# Patient Record
Sex: Male | Born: 1935
Health system: Midwestern US, Community
[De-identification: ages and names within clinical notes are randomized; demographics above are authoritative.]

## PROBLEM LIST (undated history)

## (undated) DIAGNOSIS — I4891 Unspecified atrial fibrillation: Secondary | ICD-10-CM

## (undated) DIAGNOSIS — B019 Varicella without complication: Secondary | ICD-10-CM

## (undated) DIAGNOSIS — C61 Malignant neoplasm of prostate: Secondary | ICD-10-CM

## (undated) DIAGNOSIS — I10 Essential (primary) hypertension: Secondary | ICD-10-CM

## (undated) DIAGNOSIS — S129XXA Fracture of neck, unspecified, initial encounter: Secondary | ICD-10-CM

## (undated) DIAGNOSIS — K5790 Diverticulosis of intestine, part unspecified, without perforation or abscess without bleeding: Secondary | ICD-10-CM

## (undated) DIAGNOSIS — G459 Transient cerebral ischemic attack, unspecified: Secondary | ICD-10-CM

## (undated) DIAGNOSIS — R011 Cardiac murmur, unspecified: Secondary | ICD-10-CM

## (undated) DIAGNOSIS — E119 Type 2 diabetes mellitus without complications: Secondary | ICD-10-CM

## (undated) DIAGNOSIS — C4490 Unspecified malignant neoplasm of skin, unspecified: Secondary | ICD-10-CM

## (undated) DIAGNOSIS — G4730 Sleep apnea, unspecified: Secondary | ICD-10-CM

## (undated) DIAGNOSIS — E785 Hyperlipidemia, unspecified: Secondary | ICD-10-CM

## (undated) DIAGNOSIS — H60311 Diffuse otitis externa, right ear: Principal | ICD-10-CM

## (undated) DIAGNOSIS — Z96611 Presence of right artificial shoulder joint: Secondary | ICD-10-CM

## (undated) DIAGNOSIS — I4821 Permanent atrial fibrillation: Principal | ICD-10-CM

## (undated) DIAGNOSIS — M25511 Pain in right shoulder: Secondary | ICD-10-CM

## (undated) DIAGNOSIS — D472 Monoclonal gammopathy: Secondary | ICD-10-CM

## (undated) DIAGNOSIS — R0602 Shortness of breath: Secondary | ICD-10-CM

## (undated) DIAGNOSIS — I480 Paroxysmal atrial fibrillation: Principal | ICD-10-CM

## (undated) DIAGNOSIS — I4819 Other persistent atrial fibrillation: Principal | ICD-10-CM

## (undated) HISTORY — PX: SHOULDER ARTHROSCOPY W/ ROTATOR CUFF REPAIR: SHX2400

## (undated) HISTORY — PX: HERNIA REPAIR: SHX51

## (undated) HISTORY — DX: Cardiac murmur, unspecified: R01.1

## (undated) HISTORY — PX: COLON SURGERY: SHX602

## (undated) HISTORY — PX: MOHS SURGERY: SUR867

## (undated) HISTORY — DX: Malignant neoplasm of prostate: C61

## (undated) HISTORY — DX: Transient cerebral ischemic attack, unspecified: G45.9

## (undated) HISTORY — DX: Type 2 diabetes mellitus without complications: E11.9

## (undated) HISTORY — DX: Unspecified malignant neoplasm of skin, unspecified: C44.90

## (undated) HISTORY — DX: Fracture of neck, unspecified, initial encounter: S12.9XXA

## (undated) HISTORY — PX: BASAL CELL CARCINOMA EXCISION: SHX1214

---

## 2006-05-10 HISTORY — PX: INSERT / REPLACE / REMOVE PACEMAKER: SUR710

## 2007-02-09 DIAGNOSIS — G459 Transient cerebral ischemic attack, unspecified: Secondary | ICD-10-CM

## 2007-02-09 HISTORY — DX: Transient cerebral ischemic attack, unspecified: G45.9

## 2014-08-30 DIAGNOSIS — I482 Chronic atrial fibrillation, unspecified: Secondary | ICD-10-CM | POA: Insufficient documentation

## 2015-05-21 DIAGNOSIS — R7303 Prediabetes: Secondary | ICD-10-CM | POA: Insufficient documentation

## 2015-05-21 DIAGNOSIS — I480 Paroxysmal atrial fibrillation: Secondary | ICD-10-CM | POA: Insufficient documentation

## 2015-05-21 DIAGNOSIS — E785 Hyperlipidemia, unspecified: Secondary | ICD-10-CM | POA: Insufficient documentation

## 2015-08-27 DIAGNOSIS — C61 Malignant neoplasm of prostate: Secondary | ICD-10-CM | POA: Insufficient documentation

## 2015-09-16 ENCOUNTER — Emergency Department
Admission: EM | Admit: 2015-09-16 | Discharge: 2015-09-16 | Disposition: A | Discharge status: Home / Self Care | Payer: Medicare Other | Attending: Emergency Medicine | Admitting: Emergency Medicine

## 2015-09-16 DIAGNOSIS — S91209A Unspecified open wound of unspecified toe(s) with damage to nail, initial encounter: Secondary | ICD-10-CM

## 2015-09-16 DIAGNOSIS — X58XXXA Exposure to other specified factors, initial encounter: Secondary | ICD-10-CM | POA: Diagnosis not present

## 2015-09-16 DIAGNOSIS — Y999 Unspecified external cause status: Secondary | ICD-10-CM | POA: Insufficient documentation

## 2015-09-16 DIAGNOSIS — Y929 Unspecified place or not applicable: Secondary | ICD-10-CM | POA: Insufficient documentation

## 2015-09-16 DIAGNOSIS — Z7982 Long term (current) use of aspirin: Secondary | ICD-10-CM | POA: Insufficient documentation

## 2015-09-16 DIAGNOSIS — Z7984 Long term (current) use of oral hypoglycemic drugs: Secondary | ICD-10-CM | POA: Diagnosis not present

## 2015-09-16 DIAGNOSIS — Y939 Activity, unspecified: Secondary | ICD-10-CM | POA: Insufficient documentation

## 2015-09-16 DIAGNOSIS — S91204A Unspecified open wound of right lesser toe(s) with damage to nail, initial encounter: Secondary | ICD-10-CM | POA: Insufficient documentation

## 2015-09-16 DIAGNOSIS — Z79899 Other long term (current) drug therapy: Secondary | ICD-10-CM | POA: Diagnosis not present

## 2015-09-16 MED ORDER — LIDOCAINE HCL (PF) 1 % IJ SOLN
INTRAMUSCULAR | Status: AC
  Administered 2015-09-16: 10:00:00
  Filled 2015-09-16: qty 5

## 2015-09-16 NOTE — ED Provider Notes (Signed)
[facility] Emergency Department Provider Note   ____________________________________________   First MD Initiated Contact with Patient 09/16/15 [phone]     (approximate)  I have reviewed the triage vital signs and the nursing notes.   HISTORY  Chief Complaint Toe Pain    HPI Nicholas Patel is a 82 y.o. male patient complain a splint nail on the fifth digit right foot.Patient states request and the nail removed but cautioned that he is on blood thinner and bleeds profusely. Patient denies any pain with this complaint.   No past medical history on file.  There are no active problems to display for this patient.   No past surgical history on file.  Prior to Admission medications   Medication Sig Start Date End Date Taking? Authorizing Provider  alfuzosin (UROXATRAL) 10 MG 24 hr tablet Take 10 mg by mouth daily with breakfast.   Yes Historical Provider, MD  aspirin 81 MG tablet Take 81 mg by mouth daily.   Yes Historical Provider, MD  atorvastatin (LIPITOR) 20 MG tablet Take 20 mg by mouth daily.   Yes Historical Provider, MD  metFORMIN (GLUCOPHAGE) 500 MG tablet Take 500 mg by mouth 2 (two) times daily with a meal.   Yes Historical Provider, MD  metoprolol tartrate (LOPRESSOR) 25 MG tablet Take 25 mg by mouth 2 (two) times daily.   Yes Historical Provider, MD  montelukast (SINGULAIR) 10 MG tablet Take 10 mg by mouth at bedtime.   Yes Historical Provider, MD  Rivaroxaban (XARELTO) 15 MG TABS tablet Take 15 mg by mouth 2 (two) times daily with a meal.   Yes Historical Provider, MD    Allergies Hyman Hopes allergy]  No family history on file.  Social History Social History  Substance Use Topics  . Smoking status: Not on file  . Smokeless tobacco: Not on file  . Alcohol use Not on file    Review of Systems Constitutional: No fever/chills Eyes: No visual changes. ENT: No sore throat. Cardiovascular: Denies chest pain. Respiratory: Denies  shortness of breath. Gastrointestinal: No abdominal pain.  No nausea, no vomiting.  No diarrhea.  No constipation. Genitourinary: Negative for dysuria. Musculoskeletal: Negative for back pain. Skin: Negative for rash.Split toenail Neurological: Negative for headaches, focal weakness or numbness.    ____________________________________________   PHYSICAL EXAM:  VITAL SIGNS: ED Triage Vitals  Enc Vitals Group     BP 09/16/15 0830 139/87     Pulse Rate 09/16/15 0830 62     Resp 09/16/15 0830 18     Temp 09/16/15 0830 98.2 F (36.8 C)     Temp Source 09/16/15 0830 Oral     SpO2 09/16/15 0830 99 %     Weight 09/16/15 0830 160 lb (72.6 kg)     Height 09/16/15 0830 5\' 7"  (1.702 m)     Head Circumference --      Peak Flow --      Pain Score 09/16/15 0831 0     Pain Loc --      Pain Edu? --      Excl. in [place]? --     Constitutional: Alert and oriented. Well appearing and in no acute distress. Eyes: Conjunctivae are normal. PERRL. EOMI. Head: Atraumatic. Nose: No congestion/rhinnorhea. Mouth/Throat: Mucous membranes are moist.  Oropharynx non-erythematous. Neck: No stridor. No cervical spine tenderness to palpation. Hematological/Lymphatic/Immunilogical: No cervical lymphadenopathy. Cardiovascular: Normal rate, regular rhythm. Grossly normal heart sounds.  Good peripheral circulation. Respiratory: Normal respiratory effort.  No  retractions. Lungs CTAB. Gastrointestinal: Soft and nontender. No distention. No abdominal bruits. No CVA tenderness. Musculoskeletal: No lower extremity tenderness nor edema.  No joint effusions. Neurologic:  Normal speech and language. No gross focal neurologic deficits are appreciated. No gait instability. Skin:  Skin is warm, dry and intact. No rash noted. Split toenail with partial avulsion to the medial nail. Psychiatric: Mood and affect are normal. Speech and behavior are normal.  ____________________________________________   LABS (all labs  ordered are listed, but only abnormal results are displayed)  Labs Reviewed - No data to display ____________________________________________  EKG   ____________________________________________  RADIOLOGY   ____________________________________________   PROCEDURES  Procedure(s) performed: None  Procedures  Critical Care performed: No  ____________________________________________   INITIAL IMPRESSION / ASSESSMENT AND PLAN / ED COURSE  Pertinent labs & imaging results that were available during my care of the patient were reviewed by me and considered in my medical decision making (see chart for details).  Partial avulsion of the fifth right toe nail. Status post digital block nail was completely removed. Patient given discharge care instructions. Patient advised to follow-up with  clinical continued care.  Clinical Course     ____________________________________________   FINAL CLINICAL IMPRESSION(S) / ED DIAGNOSES  Final diagnoses:  Toenail avulsion, initial encounter      NEW MEDICATIONS STARTED DURING THIS VISIT:  New Prescriptions   No medications on file     Note:  This document was prepared using Dragon voice recognition software and may include unintentional dictation errors.    Sable Feil, PA-C 09/16/15 [place], MD 09/16/15 [phone]

## 2015-09-16 NOTE — ED Notes (Signed)
See triage note  States his toenail split last pm  Concerned about bleeding and infection  Bleeding controlled at present

## 2015-09-16 NOTE — ED Triage Notes (Signed)
Pt reports that he pulled his rt pinky toenail last night with the covers and it split. Pt is on blood thinners.

## 2015-10-14 ENCOUNTER — Encounter: Payer: Medicare Other | Admitting: Internal Medicine

## 2015-11-04 ENCOUNTER — Encounter: Payer: Self-pay | Admitting: Internal Medicine

## 2015-11-04 ENCOUNTER — Ambulatory Visit: Payer: Medicare Other | Admitting: Internal Medicine

## 2015-11-04 ENCOUNTER — Ambulatory Visit (INDEPENDENT_AMBULATORY_CARE_PROVIDER_SITE_OTHER): Payer: Medicare Other | Admitting: Internal Medicine

## 2015-11-04 VITALS — BP 102/62 | HR 74 | Ht 67.0 in | Wt 169.8 lb

## 2015-11-04 DIAGNOSIS — I951 Orthostatic hypotension: Secondary | ICD-10-CM | POA: Diagnosis not present

## 2015-11-04 DIAGNOSIS — Z0000 Encounter for general adult medical examination without abnormal findings: Secondary | ICD-10-CM | POA: Diagnosis not present

## 2015-11-04 DIAGNOSIS — Z950 Presence of cardiac pacemaker: Secondary | ICD-10-CM

## 2015-11-04 DIAGNOSIS — I4891 Unspecified atrial fibrillation: Secondary | ICD-10-CM

## 2015-11-04 MED ORDER — RIVAROXABAN 20 MG PO TABS: ORAL_TABLET | ORAL | 3 refills | Status: AC

## 2015-11-04 MED ORDER — RIVAROXABAN 20 MG PO TABS: ORAL_TABLET | ORAL | Status: DC

## 2015-11-04 NOTE — Addendum Note (Signed)
Addended by: Alvis Lemmings C on: 11/04/2015 01:58 PM   Modules accepted: Orders

## 2015-11-04 NOTE — Progress Notes (Signed)
ELECTROPHYSIOLOGY CONSULT NOTE  Patient ID: Nicholas Patel, MRN: [id number], DOB/AGE: 1930/08/09 82 y.o. Admit date: (Not on file) Date of Consult: 11/04/2015  Primary Physician: Glendon Axe, MD Primary Cardiologist: [place] Physician none  Chief Complaint: establish   HPI Nicholas Patel is a 82 y.o. male  Seen to establish pacemaker follow-up. He has a [employer] device implanted  2008  He has a history of atrial fibrillation for which he has been treated with propafenone as well as anticoagulated with Xarelto and aspirin.  More remotely he had been on warfarin. Referral to dose had been decreased from 20--15 because of bruising; aspirin was maintained. His outside medical record refers to a TIA.   He has no known coronary disease. He does not recall prior catheterization or stress testing.    he has had no problems with chest pain or shortness of breath. His activity however is markedly limited by "his stability". He has a more remote history of significant orthostatic intolerance. He has had a series of falls with significant injury.   He also has sleep apnea  He also has an episode where "his heart stopped" about 4 years ago. This occurred in the context of his pacemaker and was his understanding of what he was told by his cardiologist in [place]. This was associated with severe dyspnea  Review his old medical records in care everywhere from [place] did not include an ultrasound or stress testing   GFR 7/17 was 65   Cr 0.97  Past Medical History:  Diagnosis Date  . Broken neck ([place])   . Diabetes mellitus without complication ([place])   . Heart murmur    as child  . Prostate cancer ([place])   . Skin cancer   . TIA (transient ischemic attack) 2009      Surgical History:  Past Surgical History:  Procedure Laterality Date  . COLON SURGERY    . HERNIA REPAIR Left   . INSERT / REPLACE / REMOVE PACEMAKER  05/2006  . MOHS SURGERY    . SHOULDER ARTHROSCOPY W/ ROTATOR  CUFF REPAIR Left      Home Meds: Prior to Admission medications   Medication Sig Start Date End Date Taking? Authorizing Provider  alfuzosin (UROXATRAL) 10 MG 24 hr tablet Take 10 mg by mouth daily with breakfast.   Yes Historical Provider, MD  aspirin 81 MG tablet Take 81 mg by mouth daily.   Yes Historical Provider, MD  atorvastatin (LIPITOR) 40 MG tablet Take 40 mg by mouth daily.   Yes Historical Provider, MD  metFORMIN (GLUCOPHAGE) 500 MG tablet Take 500 mg by mouth 2 (two) times daily with a meal.   Yes Historical Provider, MD  metoprolol tartrate (LOPRESSOR) 25 MG tablet Take 25 mg by mouth 2 (two) times daily.   Yes Historical Provider, MD  montelukast (SINGULAIR) 10 MG tablet Take 10 mg by mouth at bedtime.   Yes Historical Provider, MD  Rivaroxaban (XARELTO) 15 MG TABS tablet Take 15 mg by mouth daily with supper.    Yes Historical Provider, MD    Allergies:  Allergies  Allergen Reactions  . Other     Other reaction(s): Other (See Comments) Dust/cats - sneezing  . Hyman Hopes Allergy]     Social History   Social History  . Marital status: Single    Spouse name: N/A  . Number of children: N/A  . Years of education: N/A   Occupational History  . Not on file.  Social History Main Topics  . Smoking status: Never Smoker  . Smokeless tobacco: Never Used  . Alcohol use 0.6 oz/week    1 Glasses of wine per week     Comment: qd  . Drug use: No  . Sexual activity: Not on file   Other Topics Concern  . Not on file   Social History Narrative  . No narrative on file     Family History  Problem Relation Age of Onset  . Lung cancer Mother      ROS:  Please see the history of present illness.     All other systems reviewed and negative.    Physical Exam:  Blood pressure 102/62, pulse 74, height 5\' 7"  (1.702 m), weight 169 lb 12 oz (77 kg). General: Well developed, well nourished male in no acute distress. Head: Normocephalic, atraumatic, sclera non-icteric,  no xanthomas, nares are without discharge. EENT: normal  Lymph Nodes:  none Neck: Negative for carotid bruits. JVD not elevated. Back:without scoliosis kyphosis  Device pocket well healed; without hematoma or erythema.  There is no tethering  Lungs: Clear bilaterally to auscultation without wheezes, rales, or rhonchi. Breathing is unlabored. Device pocket well healed; without hematoma or erythema.  There is no tethering Heart: RRR with S1 S2. 2/6 systooc murmur . No rubs, or gallops appreciated. Abdomen: Soft, non-tender, non-distended with normoactive bowel sounds. No hepatomegaly. No rebound/guarding. No obvious abdominal masses. Msk:  Strength and tone appear normal for age. Extremities: No clubbing or cyanosis. No  + edema.  Distal pedal pulses are 2+ and equal bilaterally. Skin: Warm and Dry Neuro: Alert and oriented X 3. CN III-XII intact Grossly normal sensory and motor function . Psych:  Responds to questions appropriately with a normal affect.      Labs: Cardiac Enzymes No results for input(s): CKTOTAL, CKMB, TROPONINI in the last 72 hours. CBC No results found for: WBC, HGB, HCT, MCV, PLT PROTIME: No results for input(s): LABPROT, INR in the last 72 hours. Chemistry No results for input(s): NA, K, CL, CO2, BUN, CREATININE, CALCIUM, PROT, BILITOT, ALKPHOS, ALT, AST, GLUCOSE in the last 168 hours.  Invalid input(s): LABALBU Lipids No results found for: CHOL, HDL, LDLCALC, TRIG BNP No results found for: PROBNP Thyroid Function Tests: No results for input(s): TSH, T4TOTAL, T3FREE, THYROIDAB in the last 72 hours.  Invalid input(s): FREET3 Miscellaneous No results found for: DDIMER  Radiology/Studies:  No results found.  EKG: `atrial fibrillation with underlying ventricular pacing and intermittent conduction   Assessment and Plan: Atrial fibrillation-persistent  Hypertension  Recurrent falls  Orthostatic intolerance  Falls  Pacemaker-St. Jude  The patient's  device was interrogated.  The information was reviewed. No changes were made in the programming.     The patient has orthostatic intolerance with a change in heart rate from 70-110  I suspect this may be contributing to his falls  nonpharmacological maneuvers including by compression and abdominal compression. Will not make any adjustments on his blood pressure medicines  His anticoagulant regime is curious; we will discontinue his aspirin increases Xarelto back to 20 mg. His GFR a few months ago is greater than 60  We will obtain an echocardiogram to look at left ventricular function  He has ischemic heart disease.  Virl Axe

## 2015-11-04 NOTE — Patient Instructions (Addendum)
Medication Instructions: - Your physician has recommended you make the following change in your medication:  1) Stop aspirin 2) Increase Xarelto to 20 mg one tablet by mouth once daily  Labwork: - none ordered  Procedures/Testing: - Your physician has requested that you have an echocardiogram. Echocardiography is a painless test that uses sound waves to create images of your heart. It provides your doctor with information about the size and shape of your heart and how well your heart's chambers and valves are working. This procedure takes approximately one hour. There are no restrictions for this procedure.  Follow-Up: - Your physician recommends that you schedule a follow-up appointment in: 10-12 weeks with Dr. Caryl Comes  Any Additional Special Instructions Will Be Listed Below (If Applicable). - please obtain/ wear an abdominal binder & spandex shorts    If you need a refill on your cardiac medications before your next appointment, please call your pharmacy.

## 2015-11-11 ENCOUNTER — Encounter: Payer: Medicare Other | Admitting: Internal Medicine

## 2015-11-20 ENCOUNTER — Other Ambulatory Visit: Payer: Self-pay

## 2015-11-20 ENCOUNTER — Ambulatory Visit (INDEPENDENT_AMBULATORY_CARE_PROVIDER_SITE_OTHER): Payer: Medicare Other

## 2015-11-20 DIAGNOSIS — I4891 Unspecified atrial fibrillation: Secondary | ICD-10-CM

## 2015-11-21 ENCOUNTER — Encounter: Payer: Self-pay | Admitting: Internal Medicine

## 2015-11-27 ENCOUNTER — Emergency Department: Payer: Medicare Other

## 2015-11-27 ENCOUNTER — Emergency Department
Admission: EM | Admit: 2015-11-27 | Discharge: 2015-11-27 | Disposition: A | Discharge status: Home / Self Care | Payer: Medicare Other | Attending: Emergency Medicine | Admitting: Emergency Medicine

## 2015-11-27 ENCOUNTER — Encounter: Payer: Self-pay | Admitting: Emergency Medicine

## 2015-11-27 DIAGNOSIS — Z85828 Personal history of other malignant neoplasm of skin: Secondary | ICD-10-CM | POA: Insufficient documentation

## 2015-11-27 DIAGNOSIS — E119 Type 2 diabetes mellitus without complications: Secondary | ICD-10-CM | POA: Diagnosis not present

## 2015-11-27 DIAGNOSIS — Z79899 Other long term (current) drug therapy: Secondary | ICD-10-CM | POA: Insufficient documentation

## 2015-11-27 DIAGNOSIS — K5900 Constipation, unspecified: Secondary | ICD-10-CM | POA: Diagnosis not present

## 2015-11-27 DIAGNOSIS — Z7984 Long term (current) use of oral hypoglycemic drugs: Secondary | ICD-10-CM | POA: Diagnosis not present

## 2015-11-27 DIAGNOSIS — Z8546 Personal history of malignant neoplasm of prostate: Secondary | ICD-10-CM | POA: Insufficient documentation

## 2015-11-27 LAB — GLUCOSE, CAPILLARY: GLUCOSE-CAPILLARY: 109 mg/dL — AB (ref 65–99)

## 2015-11-27 MED ORDER — FLEET PEDIATRIC 3.5-9.5 GM/59ML RE ENEM
1.0000 | ENEMA | Freq: Once | RECTAL | Status: DC
  Filled 2015-11-27: qty 1

## 2015-11-27 MED ORDER — FLEET ENEMA 7-19 GM/118ML RE ENEM
1.0000 | ENEMA | Freq: Once | RECTAL | Status: AC
  Administered 2015-11-27: 1 via RECTAL
  Filled 2015-11-27: qty 1

## 2015-11-27 MED ORDER — POLYETHYLENE GLYCOL 3350 17 G PO PACK: 17.0000 g | PACK | Freq: Every day | ORAL | 0 refills | Status: DC

## 2015-11-27 NOTE — ED Provider Notes (Signed)
[facility] [place] Provider Note   CSN: [id number] Arrival date & time: 11/27/15  1007     History   Chief Complaint Chief Complaint  Patient presents with  . Constipation    HPI ESTHER SELLARS is a 82 y.o. male hx of DM, prostate cancer, colon cancer s/p partial colectomy here with constipation, lower abdominal pain. Patient states that he has been constipated for the last week or so. Last normal bowel movement was about a week ago. Has been trying some prune juice and over-the-counter laxatives with minimal relief. Denies any vomiting or fevers or urinary symptoms. Patient states that he had previous episodes like this before and required an enema. No history of bowel obstruction.    The history is provided by the patient.    Past Medical History:  Diagnosis Date  . Broken neck ([place])   . Diabetes mellitus without complication ([place])   . Heart murmur    as child  . Prostate cancer ([place])   . Skin cancer   . TIA (transient ischemic attack) 2009    There are no active problems to display for this patient.   Past Surgical History:  Procedure Laterality Date  . COLON SURGERY    . HERNIA REPAIR Left   . INSERT / REPLACE / REMOVE PACEMAKER  05/2006  . MOHS SURGERY    . SHOULDER ARTHROSCOPY W/ ROTATOR CUFF REPAIR Left        Home Medications    Prior to Admission medications   Medication Sig Start Date End Date Taking? Authorizing Provider  alfuzosin (UROXATRAL) 10 MG 24 hr tablet Take 10 mg by mouth daily with breakfast.   Yes Historical Provider, MD  atorvastatin (LIPITOR) 40 MG tablet Take 40 mg by mouth daily.   Yes Historical Provider, MD  metFORMIN (GLUCOPHAGE) 500 MG tablet Take 500 mg by mouth daily.    Yes Historical Provider, MD  metoprolol tartrate (LOPRESSOR) 25 MG tablet Take 25 mg by mouth 2 (two) times daily.   Yes Historical Provider, MD  montelukast (SINGULAIR) 10 MG tablet Take 10 mg by mouth at bedtime.   Yes Historical Provider, MD    propafenone (RYTHMOL) 300 MG tablet Take 1 tablet by mouth 2 (two) times daily. 10/15/15  Yes Historical Provider, MD  rivaroxaban (XARELTO) 20 MG TABS tablet Take one tablet (20 mg) by mouth once daily 11/04/15  Yes Deboraha Sprang, MD    Family History Family History  Problem Relation Age of Onset  . Lung cancer Mother     Social History Social History  Substance Use Topics  . Smoking status: Never Smoker  . Smokeless tobacco: Never Used  . Alcohol use 0.6 oz/week    1 Glasses of wine per week     Comment: qd     Allergies   Other and Salmon [fish allergy]   Review of Systems Review of Systems  Gastrointestinal: Positive for constipation.  All other systems reviewed and are negative.    Physical Exam Updated Vital Signs BP 108/64 (BP Location: Right Arm)   Pulse 73   Temp 97.7 F (36.5 C) (Oral)   Resp 15   Ht 5\' 2"  (1.575 m)   Wt 158 lb (71.7 kg)   SpO2 97%   BMI 28.90 kg/m   Physical Exam  Constitutional: He is oriented to person, place, and time. He appears well-developed.  HENT:  Head: Normocephalic.  Eyes: EOM are normal. Pupils are equal, round, and reactive to light.  Neck:  Normal range of motion. Neck supple.  Cardiovascular: Normal rate, regular rhythm and normal heart sounds.   Pulmonary/Chest: Effort normal and breath sounds normal. No respiratory distress. He has no wheezes.  Abdominal: Soft.  Mild LLQ tenderness, no rebound   Genitourinary:  Genitourinary Comments: Rectal- small stool at vault, no obvious stool impaction   Musculoskeletal: Normal range of motion.  Neurological: He is alert and oriented to person, place, and time.  Skin: Skin is warm.  Psychiatric: He has a normal mood and affect.  Nursing note and vitals reviewed.    ED Treatments / Results  Labs (all labs ordered are listed, but only abnormal results are displayed) Labs Reviewed  GLUCOSE, CAPILLARY - Abnormal; Notable for the following:       Result Value    Glucose-Capillary 109 (*)    All other components within normal limits  CBG MONITORING, ED    EKG  EKG Interpretation None       Radiology Dg Abd Acute W/chest  Result Date: 11/27/2015 CLINICAL DATA:  Patient with history of constipation. EXAM: DG ABDOMEN ACUTE W/ 1V CHEST COMPARISON:  None. FINDINGS: Dual lead pacer apparatus overlies the left hemi thorax. Tortuosity of the thoracic aorta. Normal cardiac contours. Small calcified granuloma within the right lower lobe. No pleural effusion or pneumothorax. Stool throughout the colon as can be seen with constipation. No free intraperitoneal air. No evidence for bowel obstruction. Lower thoracic and lumbar spine degenerative changes. IMPRESSION: No acute cardiopulmonary process. Stool throughout the colon as can be seen with constipation. Electronically Signed   By: Lovey Newcomer M.D.   On: 11/27/2015 11:50    Procedures Procedures (including critical care time)  Medications Ordered in ED Medications  sodium phosphate (FLEET) 7-19 GM/118ML enema 1 enema (1 enema Rectal Given 11/27/15 1347)     Initial Impression / Assessment and Plan / ED Course  I have reviewed the triage vital signs and the nursing notes.  Pertinent labs & imaging results that were available during my care of the patient were reviewed by me and considered in my medical decision making (see chart for details).  Clinical Course    MAT SUFFRIDGE is a 82 y.o. male here with LLQ pain, constipation. Symptoms likely from constipation. No vomiting to suggest SBO. Will get xrays, give enema and reassess.   2:27 PM Xray showed constipation and no SBO. Given enema and had a bowel movement. Felt better. Recommend miralax daily until stools are soft.   Final Clinical Impressions(s) / ED Diagnoses   Final diagnoses:  None    New Prescriptions New Prescriptions   No medications on file     Drenda Freeze, MD 11/27/15 1428

## 2015-11-27 NOTE — ED Triage Notes (Signed)
Says constipation.  Says has had before and can usually give self an enema and get better.  Says hx of colon surgery 17 yrs ago.

## 2015-11-27 NOTE — ED Notes (Signed)
Fleets ordered from pharmacy

## 2015-11-27 NOTE — Discharge Instructions (Signed)
Take miralax daily until your stools are soft for a week.   See your doctor.  Stay hydrated   Return to ER if you have worse abdominal pain, constipation, vomiting, fever.

## 2015-11-27 NOTE — ED Notes (Signed)
Report received - pt here for constipation with xr ordered and cbg.

## 2015-12-01 DIAGNOSIS — I480 Paroxysmal atrial fibrillation: Secondary | ICD-10-CM | POA: Insufficient documentation

## 2015-12-01 DIAGNOSIS — E7800 Pure hypercholesterolemia, unspecified: Secondary | ICD-10-CM | POA: Insufficient documentation

## 2015-12-01 DIAGNOSIS — E119 Type 2 diabetes mellitus without complications: Secondary | ICD-10-CM | POA: Insufficient documentation

## 2015-12-09 ENCOUNTER — Encounter: Payer: Self-pay | Admitting: Urology

## 2015-12-09 ENCOUNTER — Ambulatory Visit (INDEPENDENT_AMBULATORY_CARE_PROVIDER_SITE_OTHER): Payer: Medicare Other | Admitting: Urology

## 2015-12-09 VITALS — BP 103/72 | HR 71 | Ht 67.0 in | Wt 168.0 lb

## 2015-12-09 DIAGNOSIS — Z8546 Personal history of malignant neoplasm of prostate: Secondary | ICD-10-CM | POA: Diagnosis not present

## 2015-12-09 DIAGNOSIS — N401 Enlarged prostate with lower urinary tract symptoms: Secondary | ICD-10-CM | POA: Diagnosis not present

## 2015-12-09 DIAGNOSIS — N138 Other obstructive and reflux uropathy: Secondary | ICD-10-CM

## 2015-12-09 DIAGNOSIS — R3129 Other microscopic hematuria: Secondary | ICD-10-CM

## 2015-12-09 LAB — URINALYSIS, COMPLETE
Bilirubin, UA: NEGATIVE
GLUCOSE, UA: NEGATIVE
KETONES UA: NEGATIVE
Nitrite, UA: NEGATIVE
Protein, UA: NEGATIVE
SPEC GRAV UA: 1.02 (ref 1.005–1.030)
Urobilinogen, Ur: 2 mg/dL — ABNORMAL HIGH (ref 0.2–1.0)
pH, UA: 6 (ref 5.0–7.5)

## 2015-12-09 LAB — MICROSCOPIC EXAMINATION: Bacteria, UA: NONE SEEN

## 2015-12-09 NOTE — Progress Notes (Signed)
 12/09/2015 4:38 PM   Nicholas Patel 07/09/1930 [id number]  Referring provider: [name], MD [address] [facility] [place], [place] [address]  Chief Complaint  Patient presents with  . Prostate Cancer    New Patient  . Hematuria    HPI:  82 yo M here to establish care for history of prostate cancer and microscopic hematuria.    He is a personal history of prostate cancer and underwent radiation treatments for this in 2010 in [place], [place] in 2000.  Most recent PSA 0.67 on 12/01/15.    He has has a history of BPH and is on Uroxatral which he has been taking for may years.  His urinary symptoms are stable.  No voiding symptoms today.  When he comes off the medication, he has urinary hesitancy.  Nocturia x1-2.    He was also noted to have microscopic hematuria on UA performed and 08/2015. He had 4-10 red blood cells per high-powered field in the absence of evidence of infection.  He has persistent microscopic hematuria 3-10 RBC/ HPF, otherwise unremarkable.    He is chronically on Xarelto.  He is a nonsmoker.  No history of flank pain or kidney stones.   PMH: Past Medical History:  Diagnosis Date  . Broken neck (HCC)   . Diabetes mellitus without complication (HCC)   . Heart murmur    as child  . Prostate cancer (HCC)   . Skin cancer   . TIA (transient ischemic attack) 2009    Surgical History: Past Surgical History:  Procedure Laterality Date  . COLON SURGERY    . HERNIA REPAIR Left   . INSERT / REPLACE / REMOVE PACEMAKER  05/2006  . MOHS SURGERY    . SHOULDER ARTHROSCOPY W/ ROTATOR CUFF REPAIR Left     Home Medications:    Medication List       Accurate as of 12/09/15  4:38 PM. Always use your most recent med list.          alfuzosin 10 MG 24 hr tablet Commonly known as:  UROXATRAL Take 10 mg by mouth daily with breakfast.   atorvastatin 40 MG tablet Commonly known as:  LIPITOR Take 40 mg by mouth daily.   metFORMIN 500 MG  tablet Commonly known as:  GLUCOPHAGE Take 500 mg by mouth daily.   metoprolol tartrate 25 MG tablet Commonly known as:  LOPRESSOR Take 25 mg by mouth 2 (two) times daily.   montelukast 10 MG tablet Commonly known as:  SINGULAIR Take 10 mg by mouth at bedtime.   polyethylene glycol packet Commonly known as:  MIRALAX / GLYCOLAX Take 17 g by mouth daily.   propafenone 300 MG tablet Commonly known as:  RYTHMOL Take 1 tablet by mouth 2 (two) times daily.   rivaroxaban 20 MG Tabs tablet Commonly known as:  XARELTO Take one tablet (20 mg) by mouth once daily       Allergies:  Allergies  Allergen Reactions  . Other     Other reaction(s): Other (See Comments) Dust/cats - sneezing  . [place]mon [Fish Allergy]     Family History: Family History  Problem Relation Age of Onset  . Lung cancer Mother   . Prostate cancer Neg Hx   . Kidney cancer Neg Hx     Social History:  reports that he has never smoked. He has never used smokeless tobacco. He reports that he drinks about 0.6 oz of alcohol per week . He reports that   he does not use drugs.  ROS: UROLOGY Frequent Urination?: No Hard to postpone urination?: No Burning/pain with urination?: No Get up at night to urinate?: Yes Leakage of urine?: No Urine stream starts and stops?: No Trouble starting stream?: No Do you have to strain to urinate?: No Blood in urine?: No Urinary tract infection?: No Sexually transmitted disease?: No Injury to kidneys or bladder?: No Painful intercourse?: No Weak stream?: No Erection problems?: No Penile pain?: No  Gastrointestinal Nausea?: No Vomiting?: No Indigestion/heartburn?: No Diarrhea?: No Constipation?: Yes  Constitutional Fever: No Night sweats?: No Weight loss?: No Fatigue?: No  Skin Skin rash/lesions?: Yes Itching?: No  Eyes Blurred vision?: No Double vision?: No  Ears/Nose/Throat Sore throat?: No Sinus problems?: No  Hematologic/Lymphatic Swollen glands?:  No Easy bruising?: No  Cardiovascular Leg swelling?: No Chest pain?: No  Respiratory Cough?: No Shortness of breath?: No  Endocrine Excessive thirst?: No  Musculoskeletal Back pain?: Yes Joint pain?: No  Neurological Headaches?: No Dizziness?: No  Psychologic Depression?: No Anxiety?: No  Physical Exam: BP 103/72   Pulse 71   Ht 5' 7" (1.702 m)   Wt 168 lb (76.2 kg)   BMI 26.31 kg/m   Constitutional:  Alert and oriented, No acute distress. HEENT: [place] AT, moist mucus membranes.  Trachea midline, no masses. Cardiovascular: No clubbing, cyanosis, or edema. Respiratory: Normal respiratory effort, no increased work of breathing. GI: Abdomen is soft, nontender, nondistended, no abdominal masses GU: No CVA tenderness.  Skin: No rashes, bruises or suspicious lesions. Neurologic: Grossly intact, no focal deficits, moving all 4 extremities. Psychiatric: Normal mood and affect.  Laboratory Data: Comprehensive Metabolic Panel (CMP) (44/81/8563 9:53 AM) Comprehensive Metabolic Panel (CMP) (14/97/0263 9:53 AM)  Component Value Ref Range  Glucose 112 (H) 70 - 110 mg/dL  Sodium 132 (L) 136 - 145 mmol/L  Potassium 4.7 3.6 - 5.1 mmol/L  Chloride 101 97 - 109 mmol/L  Carbon Dioxide (CO2) 27.0 22.0 - 32.0 mmol/L  Urea Nitrogen (BUN) 17 7 - 25 mg/dL  Creatinine 1.0 0.7 - 1.3 mg/dL  Glomerular Filtration Rate (eGFR), MDRD Estimate 71 >60 mL/min/1.73sq m   PSA, Total (Screen) (12/01/2015 9:53 AM) PSA, Total (Screen) (12/01/2015 9:53 AM)  Component Value Ref Range  PSA (Prostate Specific Antigen), Total 0.67 0.10 - 4.00 ng/mL   CBC w/auto Differential (5 Part) (12/01/2015 9:53 AM) CBC w/auto Differential (5 Part) (12/01/2015 9:53 AM)  Component Value Ref Range  WBC (White Blood Cell Count) 6.0 4.1 - 10.2 10^3/uL  RBC (Red Blood Cell Count) 3.66 (L) 4.69 - 6.13 10^6/uL  Hemoglobin 11.8 (L) 14.1 - 18.1 gm/dL  Hematocrit 34.0 (L) 40.0 - 52.0 %  MCV (Mean Corpuscular Volume)  92.9 80.0 - 100.0 fl  MCH (Mean Corpuscular Hemoglobin) 32.2 (H) 27.0 - 31.2 pg  MCHC (Mean Corpuscular Hemoglobin Concentration) 34.7 32.0 - 36.0 gm/dL  Platelet Count 100 (L) 150 - 450 10^3/uL  RDW-CV (Red Cell Distribution Width) 17.5 (H) 11.6 - 14.8 %   Urinalysis w/Microscopic (12/01/2015 9:53 AM) Urinalysis w/Microscopic (12/01/2015 9:53 AM)  Component Value Ref Range  Color Yellow Yellow, Straw  Clarity Clear Clear  Specific Gravity 1.010 1.000 - 1.030  pH, Urine 7.0 5.0 - 8.0  Protein, Urinalysis Negative Negative, Trace mg/dL  Glucose, Urinalysis Negative Negative mg/dL  Ketones, Urinalysis Negative Negative mg/dL  Blood, Urinalysis Small (A) Negative  Nitrite, Urinalysis Negative Negative  Leukocyte Esterase, Urinalysis Negative Negative  White Blood Cells, Urinalysis None Seen None Seen, 0-3 /hpf  Red Blood  Cells, Urinalysis 4-10 (A) None Seen, 0-3 /hpf  Bacteria, Urinalysis Rare (A) None Seen /hpf  Squamous Epithelial Cells, Urinalysis Rare Rare, Few, None Seen /hpf    Urinalysis UA today with 3-10 RBC/ HFP, see EPIC   Pertinent Imaging: No recent GU imaging  Assessment & Plan:    1. History of prostate cancer S/p XRT 2010 Most recent PSA 0.67, presumably at nadir Recommend annual PSA   2. BPH with obstruction/lower urinary tract symptoms Continue Uroxitrol   3. Microscopic hematuria We discussed the differential diagnosis for microscopic hematuria including nephrolithiasis, renal or upper tract tumors, bladder stones, UTIs, or bladder tumors as well as undetermined etiologies. Per AUA guidelines, I did recommend complete microscopic hematuria evaluation including CTU, possible urine cytology, and office cystoscopy. - Urinalysis, Complete - CT HEMATURIA WORKUP; Future   Return in about 4 weeks (around 01/06/2016) for cystoscopy, f/u CT urogram.  Ashley Brandon, MD  [facility] [address], [address] [place], [place]  [address] [phone]  

## 2015-12-23 ENCOUNTER — Ambulatory Visit
Admission: RE | Admit: 2015-12-23 | Discharge: 2015-12-23 | Disposition: A | Discharge status: Home / Self Care | Payer: Medicare Other | Source: Ambulatory Visit | Attending: Urology | Admitting: Urology

## 2015-12-23 DIAGNOSIS — I2510 Atherosclerotic heart disease of native coronary artery without angina pectoris: Secondary | ICD-10-CM | POA: Insufficient documentation

## 2015-12-23 DIAGNOSIS — D71 Functional disorders of polymorphonuclear neutrophils: Secondary | ICD-10-CM | POA: Insufficient documentation

## 2015-12-23 DIAGNOSIS — K4090 Unilateral inguinal hernia, without obstruction or gangrene, not specified as recurrent: Secondary | ICD-10-CM | POA: Insufficient documentation

## 2015-12-23 DIAGNOSIS — R3129 Other microscopic hematuria: Secondary | ICD-10-CM | POA: Insufficient documentation

## 2015-12-23 DIAGNOSIS — K5730 Diverticulosis of large intestine without perforation or abscess without bleeding: Secondary | ICD-10-CM | POA: Diagnosis not present

## 2015-12-23 DIAGNOSIS — K439 Ventral hernia without obstruction or gangrene: Secondary | ICD-10-CM | POA: Diagnosis not present

## 2015-12-23 DIAGNOSIS — I708 Atherosclerosis of other arteries: Secondary | ICD-10-CM | POA: Insufficient documentation

## 2015-12-23 DIAGNOSIS — M47896 Other spondylosis, lumbar region: Secondary | ICD-10-CM | POA: Diagnosis not present

## 2015-12-23 DIAGNOSIS — I700 Atherosclerosis of aorta: Secondary | ICD-10-CM | POA: Insufficient documentation

## 2015-12-23 DIAGNOSIS — M5136 Other intervertebral disc degeneration, lumbar region: Secondary | ICD-10-CM | POA: Diagnosis not present

## 2015-12-23 HISTORY — DX: Essential (primary) hypertension: I10

## 2015-12-23 MED ORDER — IOPAMIDOL (ISOVUE-300) INJECTION 61%
125.0000 mL | Freq: Once | INTRAVENOUS | Status: AC | PRN
  Administered 2015-12-23: 125 mL via INTRAVENOUS

## 2016-01-07 ENCOUNTER — Ambulatory Visit (INDEPENDENT_AMBULATORY_CARE_PROVIDER_SITE_OTHER): Payer: Medicare Other | Admitting: Urology

## 2016-01-07 VITALS — BP 125/73 | HR 109 | Ht 67.0 in | Wt 170.0 lb

## 2016-01-07 DIAGNOSIS — I712 Thoracic aortic aneurysm, without rupture, unspecified: Secondary | ICD-10-CM

## 2016-01-07 DIAGNOSIS — R3129 Other microscopic hematuria: Secondary | ICD-10-CM | POA: Diagnosis not present

## 2016-01-07 DIAGNOSIS — N401 Enlarged prostate with lower urinary tract symptoms: Secondary | ICD-10-CM

## 2016-01-07 DIAGNOSIS — N138 Other obstructive and reflux uropathy: Secondary | ICD-10-CM | POA: Diagnosis not present

## 2016-01-07 DIAGNOSIS — Z8546 Personal history of malignant neoplasm of prostate: Secondary | ICD-10-CM

## 2016-01-07 LAB — URINALYSIS, COMPLETE
BILIRUBIN UA: NEGATIVE
Glucose, UA: NEGATIVE
Ketones, UA: NEGATIVE
Leukocytes, UA: NEGATIVE
NITRITE UA: NEGATIVE
PH UA: 6.5 (ref 5.0–7.5)
PROTEIN UA: NEGATIVE
Specific Gravity, UA: 1.015 (ref 1.005–1.030)
UUROB: 0.2 mg/dL (ref 0.2–1.0)

## 2016-01-07 LAB — MICROSCOPIC EXAMINATION: BACTERIA UA: NONE SEEN

## 2016-01-07 MED ORDER — CIPROFLOXACIN HCL 500 MG PO TABS
500.0000 mg | ORAL_TABLET | Freq: Once | ORAL | Status: AC
  Administered 2016-01-07: 500 mg via ORAL

## 2016-01-07 MED ORDER — LIDOCAINE HCL 2 % EX GEL
1.0000 "application " | Freq: Once | CUTANEOUS | Status: AC
  Administered 2016-01-07: 1 via URETHRAL

## 2016-01-07 NOTE — Progress Notes (Signed)
   01/07/16  CC:  Chief Complaint  Patient presents with  . Cysto    HPI:   82 year old male with a history of microscopic hematuria who presents today to complete his work hematuria workup with cystoscopy. CT urogram on 12/23/15 unremarkable other than for incidental 4.6 thoracic abdominal aortic aneurysm.  He returns for cystoscopy.  He is a personal history of prostate cancer and underwent radiation treatments for this in 2010 in [place], [place] in 2000.  Most recent PSA 0.67 on 12/01/15.    He has has a history of BPH and is on Uroxatral which he has been taking for may years.  His urinary symptoms are stable.  No voiding symptoms today.  When he comes off the medication, he has urinary hesitancy.  Nocturia x1-2.    He was also noted to have microscopic hematuria on UA performed and 08/2015. He had 4-10 red blood cells per high-powered field in the absence of evidence of infection.  He has persistent microscopic hematuria 3-10 RBC/ HPF, otherwise unremarkable.    He is chronically on Xarelto.  He is a nonsmoker.  No history of flank pain or kidney stones.   Blood pressure 125/73, pulse (!) 109, height 5\' 7"  (1.702 m), weight 170 lb (77.1 kg). NED. A&Ox3.   No respiratory distress   Abd soft, NT, ND Normal phallus with bilateral descended testicles  Cystoscopy Procedure Note  Patient identification was confirmed, informed consent was obtained, and patient was prepped using Betadine solution.  Lidocaine jelly was administered per urethral meatus.    Preoperative abx where received prior to procedure.     Pre-Procedure: - Inspection reveals a normal caliber ureteral meatus.  Procedure: The flexible cystoscope was introduced without difficulty - No urethral strictures/lesions are present. - Enlarged prostate bilobar coapation - Mildy elevated bladder neck - Bilateral ureteral orifices identified - Bladder mucosa  reveals no ulcers, tumors, or lesions - No bladder  stones - Mild trabeculation, elongated bladder shape  Retroflexion shows slight intravesical protrusion but no obvious median lobe of the prostate   Post-Procedure: - Patient tolerated the procedure well  Assessment/ Plan:  1. History of prostate cancer S/p XRT 2010 Most recent PSA 0.67, presumably at nadir Recommend annual PSA   2. BPH with obstruction/lower urinary tract symptoms Continue Uroxitrol   3. Microscopic hematuria S/p negative work up including CT urogram and cystoscopy  4. Incidental thoracic abdominal aortic aneurysm Asymptomatic, findings are reviewed with patient  will arrange for referral to vascular surgery for consultation  Return in about 1 year (around 01/06/2017) for PSA.  Hollice Espy, MD

## 2016-01-08 LAB — CUP PACEART INCLINIC DEVICE CHECK
Brady Statistic RV Percent Paced: 16 %
Date Time Interrogation Session: 20170926125338
Implantable Lead Implant Date: 20080310
Implantable Lead Location: 753860
Lead Channel Impedance Value: 371 Ohm
Lead Channel Pacing Threshold Amplitude: 1.25 V
Lead Channel Pacing Threshold Pulse Width: 0.6 ms
Lead Channel Sensing Intrinsic Amplitude: 0.2 mV
Lead Channel Setting Pacing Amplitude: 2 V
MDC IDC LEAD IMPLANT DT: 20080310
MDC IDC LEAD LOCATION: 753859
MDC IDC MSMT BATTERY IMPEDANCE: 3500 Ohm
MDC IDC MSMT BATTERY VOLTAGE: 2.73 V
MDC IDC MSMT LEADCHNL RA IMPEDANCE VALUE: 320 Ohm
MDC IDC MSMT LEADCHNL RV SENSING INTR AMPL: 6.7 mV
MDC IDC PG IMPLANT DT: 20080310
MDC IDC PG SERIAL: 1810320
MDC IDC SET LEADCHNL RV PACING AMPLITUDE: 2.5 V
MDC IDC SET LEADCHNL RV PACING PULSEWIDTH: 0.6 ms
MDC IDC SET LEADCHNL RV SENSING SENSITIVITY: 1.5 mV
MDC IDC STAT BRADY RA PERCENT PACED: 84 %

## 2016-01-20 ENCOUNTER — Encounter: Payer: Medicare Other | Admitting: Internal Medicine

## 2016-01-29 ENCOUNTER — Encounter: Payer: Self-pay | Admitting: Internal Medicine

## 2016-02-17 ENCOUNTER — Encounter: Payer: Self-pay | Admitting: Internal Medicine

## 2016-02-17 ENCOUNTER — Ambulatory Visit (INDEPENDENT_AMBULATORY_CARE_PROVIDER_SITE_OTHER): Payer: Medicare Other | Admitting: Internal Medicine

## 2016-02-17 VITALS — BP 88/64 | HR 70 | Ht 67.0 in | Wt 168.0 lb

## 2016-02-17 DIAGNOSIS — I4819 Other persistent atrial fibrillation: Secondary | ICD-10-CM | POA: Insufficient documentation

## 2016-02-17 DIAGNOSIS — I481 Persistent atrial fibrillation: Secondary | ICD-10-CM | POA: Diagnosis not present

## 2016-02-17 DIAGNOSIS — Z950 Presence of cardiac pacemaker: Secondary | ICD-10-CM | POA: Diagnosis not present

## 2016-02-17 LAB — CUP PACEART INCLINIC DEVICE CHECK
Battery Impedance: 4300 Ohm
Date Time Interrogation Session: 20180109151105
Implantable Lead Implant Date: 20080310
Implantable Lead Location: 753859
Implantable Lead Location: 753860
Implantable Pulse Generator Implant Date: 20080310
Lead Channel Impedance Value: 368 Ohm
Lead Channel Pacing Threshold Amplitude: 1.25 V
Lead Channel Sensing Intrinsic Amplitude: 5.3 mV
Lead Channel Setting Pacing Amplitude: 2.5 V
Lead Channel Setting Sensing Sensitivity: 1.5 mV
MDC IDC LEAD IMPLANT DT: 20080310
MDC IDC MSMT BATTERY VOLTAGE: 2.71 V
MDC IDC MSMT LEADCHNL RA IMPEDANCE VALUE: 327 Ohm
MDC IDC MSMT LEADCHNL RA SENSING INTR AMPL: 0.3 mV
MDC IDC MSMT LEADCHNL RV PACING THRESHOLD PULSEWIDTH: 0.6 ms
MDC IDC SET LEADCHNL RA PACING AMPLITUDE: 2 V
MDC IDC SET LEADCHNL RV PACING PULSEWIDTH: 0.6 ms
MDC IDC STAT BRADY RA PERCENT PACED: 29 %
MDC IDC STAT BRADY RV PERCENT PACED: 99 % — AB
Pulse Gen Model: 5816
Pulse Gen Serial Number: 1810320

## 2016-02-17 NOTE — Progress Notes (Signed)
Patient Care Team: Glendon Axe, MD as PCP - General (Internal Medicine)   HPI  Nicholas Patel is a 83 y.o. male Seen in follow-up for atrial fibrillation and previously implanted pacemaker. He has been treated with propafenone and Rivaroxaban the dose of which was increased at his last visit. Aspirin was discontinued at that time. He is having much less bruising.  Exercise tolerance is quite good. In fact he says for his age it is great. He denies shortness of breath or peripheral edema or chest discomfort  Thromboembolic risk profile is notable for age-54 TIA-2 diabetes-1 hypertension-1 for a CHADS-VASc score of greater than or equal to 6  Echocardiogram 10/17 demonstrated normal left ventricular function with mild LAE  Records and Results Reviewed Myoview report 2012 demonstrated no ischemia and no resting perfusion defects  Past Medical History:  Diagnosis Date  . Broken neck ([place])   . Diabetes mellitus without complication ([place])   . Heart murmur    as child  . Hypertension   . Prostate cancer ([place])   . Skin cancer   . TIA (transient ischemic attack) 2009    Past Surgical History:  Procedure Laterality Date  . COLON SURGERY    . HERNIA REPAIR Left   . INSERT / REPLACE / REMOVE PACEMAKER  05/2006  . MOHS SURGERY    . SHOULDER ARTHROSCOPY W/ ROTATOR CUFF REPAIR Left     Current Outpatient Prescriptions  Medication Sig Dispense Refill  . alfuzosin (UROXATRAL) 10 MG 24 hr tablet Take 10 mg by mouth daily with breakfast.    . atorvastatin (LIPITOR) 40 MG tablet Take 40 mg by mouth daily.    . metFORMIN (GLUCOPHAGE) 500 MG tablet Take 500 mg by mouth daily.     . metoprolol tartrate (LOPRESSOR) 25 MG tablet Take 25 mg by mouth 2 (two) times daily.    . montelukast (SINGULAIR) 10 MG tablet Take 10 mg by mouth at bedtime.    . polyethylene glycol (MIRALAX / GLYCOLAX) packet Take 17 g by mouth daily. 14 each 0  . propafenone (RYTHMOL) 300 MG tablet Take 1 tablet  by mouth 2 (two) times daily.    . rivaroxaban (XARELTO) 20 MG TABS tablet Take one tablet (20 mg) by mouth once daily 90 tablet 3   No current facility-administered medications for this visit.     Allergies  Allergen Reactions  . Other     Other reaction(s): Other (See Comments) Dust/cats - sneezing  . Dani Gobble [Fish Allergy]       Review of Systems negative except from HPI and PMH  Physical Exam BP (!) 88/64 (BP Location: Left Arm, Patient Position: Sitting, Cuff Size: Normal)   Pulse 70   Ht 5\' 7"  (1.702 m)   Wt 168 lb (76.2 kg)   BMI 26.31 kg/m  Well developed and well nourished in no acute distress HENT normal E scleral and icterus clear Neck Supple JVP flat; carotids brisk and full Clear to ausculation Regular rate and 99991111 systolic murmur  Soft with active bowel sounds No clubbing cyanosis  Edema Alert and oriented, grossly normal motor and sensory function Skin Warm and Dry  ECG demonstrates atrial fibrillation with ventricular pacing  Assessment and  Plan Atrial fibrillation-persistent  Hypertension  Recurrent falls  Orthostatic intolerance  Falls  Pacemaker-St. Jude  No significant orthostasis. No recurrent falls.  On Anticoagulation;  No bleeding issues   The fact that he has had no symptoms with persistent atrial  fibrillation, in fact feeling really good, positive discontinue his propafenone at this time. Heart rate excursion has been well controlled. We will have to attend to this is the propafenone washes out.  He is anticipating knee surgery just a few weeks. As his rehabilitation will not give [place] an opportunity to assess heart rates with exercise initially we will see him in about 6 months or so.    Current medicines are reviewed at length with the patient today .  The patient does not  have concerns regarding medicines.

## 2016-02-17 NOTE — Patient Instructions (Signed)
Medication Instructions: - Your physician has recommended you make the following change in your medication:  1) Stop Rythmol (propafenone)   Labwork: - none ordered  Procedures/Testing: - none ordered  Follow-Up: - Your physician wants you to follow-up in: August with Dr. Caryl Comes. You will receive a reminder letter in the mail two months in advance. If you don't receive a letter, please call our office to schedule the follow-up appointment.  Any Additional Special Instructions Will Be Listed Below (If Applicable).     If you need a refill on your cardiac medications before your next appointment, please call your pharmacy.

## 2016-03-10 ENCOUNTER — Encounter
Admission: RE | Admit: 2016-03-10 | Discharge: 2016-03-10 | Disposition: A | Discharge status: Home / Self Care | Payer: Medicare Other | Source: Ambulatory Visit | Attending: Orthopedic Surgery | Admitting: Orthopedic Surgery

## 2016-03-10 DIAGNOSIS — M170 Bilateral primary osteoarthritis of knee: Secondary | ICD-10-CM | POA: Diagnosis not present

## 2016-03-10 DIAGNOSIS — Z01818 Encounter for other preprocedural examination: Secondary | ICD-10-CM | POA: Insufficient documentation

## 2016-03-10 DIAGNOSIS — Z01812 Encounter for preprocedural laboratory examination: Secondary | ICD-10-CM | POA: Diagnosis not present

## 2016-03-10 HISTORY — DX: Sleep apnea, unspecified: G47.30

## 2016-03-10 HISTORY — DX: Varicella without complication: B01.9

## 2016-03-10 HISTORY — DX: Unspecified atrial fibrillation: I48.91

## 2016-03-10 HISTORY — DX: Hyperlipidemia, unspecified: E78.5

## 2016-03-10 HISTORY — DX: Diverticulosis of intestine, part unspecified, without perforation or abscess without bleeding: K57.90

## 2016-03-10 LAB — CBC
HEMATOCRIT: 36.8 % — AB (ref 40.0–52.0)
Hemoglobin: 12.6 g/dL — ABNORMAL LOW (ref 13.0–18.0)
MCH: 32.4 pg (ref 26.0–34.0)
MCHC: 34.3 g/dL (ref 32.0–36.0)
MCV: 94.4 fL (ref 80.0–100.0)
PLATELETS: 103 10*3/uL — AB (ref 150–440)
RBC: 3.9 MIL/uL — ABNORMAL LOW (ref 4.40–5.90)
RDW: 19.8 % — AB (ref 11.5–14.5)
WBC: 7.7 10*3/uL (ref 3.8–10.6)

## 2016-03-10 LAB — COMPREHENSIVE METABOLIC PANEL
ALT: 19 U/L (ref 17–63)
ANION GAP: 7 (ref 5–15)
AST: 25 U/L (ref 15–41)
Albumin: 4.6 g/dL (ref 3.5–5.0)
Alkaline Phosphatase: 67 U/L (ref 38–126)
BILIRUBIN TOTAL: 1 mg/dL (ref 0.3–1.2)
BUN: 18 mg/dL (ref 6–20)
CHLORIDE: 102 mmol/L (ref 101–111)
CO2: 27 mmol/L (ref 22–32)
Calcium: 9.2 mg/dL (ref 8.9–10.3)
Creatinine, Ser: 0.96 mg/dL (ref 0.61–1.24)
GFR calc Af Amer: >60 mL/min (ref >60)
Glucose, Bld: 109 mg/dL — ABNORMAL HIGH (ref 65–99)
POTASSIUM: 4.4 mmol/L (ref 3.5–5.1)
Sodium: 136 mmol/L (ref 135–145)
TOTAL PROTEIN: 7.7 g/dL (ref 6.5–8.1)

## 2016-03-10 LAB — C-REACTIVE PROTEIN

## 2016-03-10 LAB — PROTIME-INR
INR: 1.12
PROTHROMBIN TIME: 14.5 s (ref 11.4–15.2)

## 2016-03-10 LAB — URINALYSIS, ROUTINE W REFLEX MICROSCOPIC
BILIRUBIN URINE: NEGATIVE
GLUCOSE, UA: NEGATIVE mg/dL
HGB URINE DIPSTICK: NEGATIVE
KETONES UR: NEGATIVE mg/dL
Leukocytes, UA: NEGATIVE
NITRITE: NEGATIVE
PH: 6 (ref 5.0–8.0)
Protein, ur: NEGATIVE mg/dL
SPECIFIC GRAVITY, URINE: 1.016 (ref 1.005–1.030)

## 2016-03-10 LAB — TYPE AND SCREEN
ABO/RH(D): A POS
Antibody Screen: NEGATIVE

## 2016-03-10 LAB — APTT: APTT: 39 s — AB (ref 24–36)

## 2016-03-10 LAB — SEDIMENTATION RATE: SED RATE: 16 mm/h (ref 0–20)

## 2016-03-10 LAB — SURGICAL PCR SCREEN
MRSA, PCR: NEGATIVE
Staphylococcus aureus: NEGATIVE

## 2016-03-10 NOTE — Patient Instructions (Signed)
  Your procedure is scheduled on: Monday, March 22, 2016 Report to Same Day Surgery 2nd floor medical mall ([address] [place] Entrance-take elevator on left to 2nd floor.  Check in with surgery information desk.) To find out your arrival time please call [phone] between 1PM - 3PM on Friday, February 9th  Remember: Instructions that are not followed completely may result in serious medical risk, up to and including death, or upon the discretion of your surgeon and anesthesiologist your surgery may need to be rescheduled.    _x___ 1. Do not eat food or drink liquids after midnight. No gum chewing or hard candies.     __x__ 2. No Alcohol for 24 hours before or after surgery.   __x__3. No Smoking for 24 prior to surgery.   __x__  4. Bring all medications with you on the day of surgery if instructed.    __x__ 5. Notify your doctor if there is any change in your medical condition     (cold, fever, infections).     Do not wear jewelry, make-up, hairpins, clips or nail polish.  Do not wear lotions, powders, or perfumes. You may wear deodorant.  Do not shave 48 hours prior to surgery. Men may shave face and neck.  Do not bring valuables to the hospital.    [facility] is not responsible for any belongings or valuables.               Contacts, dentures or bridgework may not be worn into surgery.  Leave your suitcase in the car. After surgery it may be brought to your room.  For patients admitted to the hospital, discharge time is determined by your treatment team.   Patients discharged the day of surgery will not be allowed to drive home.  You will need someone to drive you home and stay with you the night of your procedure.    Please read over the following fact sheets that you were given:   [facility] Preparing for Surgery and or MRSA Information   _x___ Take these medicines the morning of surgery with A SIP OF WATER:    1. TOPROL  2.  3.  4.  5.  6.  ____Fleets enema or  Magnesium Citrate as directed.   _x___ Use CHG Soap or sage wipes as directed on instruction sheet   ____ Use inhalers on the day of surgery and bring to hospital day of surgery  __X__ Stop metformin 2 days prior to surgery (LAST DAY TO TAKE IS ON Friday February 9)    ____ Take 1/2 of usual insulin dose the night before surgery and none on the morning of           surgery.   ____ Stop Aspirin, Coumadin, Pllavix ,Eliquis, Effient, or Pradaxa   x__ Stop Anti-inflammatories such as Advil, Aleve, Ibuprofen, Motrin, Naproxen,          Naprosyn, Goodies powders or aspirin products. Ok to take Tylenol.   ____ Stop supplements until after surgery.    ____ Bring C-Pap to the hospital.   AFTER WE SPEAK WITH DR. Caryl Comes TO VERIFY WHEN TO STOP TAKING XARELTO; YOU WILL BE NOTIFIED VIA TELEPHONE OF WHEN TO STOP TAKING XARELTO

## 2016-03-10 NOTE — Pre-Procedure Instructions (Deleted)
SEEN BY DR J ADAMS,ANES. CT OF CHEST NOTED AND ON CHART. OK TO PROCEED.FAXED TO CARDIOLOGIST TO GET THEIR CLEARANCE.

## 2016-03-11 LAB — URINE CULTURE
Culture: NO GROWTH
SPECIAL REQUESTS: NORMAL

## 2016-03-19 NOTE — Progress Notes (Signed)
Patient states that he had talked to his cardiologist about when to stop xarelto prior to surgery. Patient to stop 3 days prior to surgery.

## 2016-03-21 MED ORDER — CEFAZOLIN SODIUM-DEXTROSE 2-4 GM/100ML-% IV SOLN: 2.0000 g | INTRAVENOUS | Status: DC

## 2016-03-21 MED ORDER — TRANEXAMIC ACID 1000 MG/10ML IV SOLN
1000.0000 mg | INTRAVENOUS | Status: DC
  Filled 2016-03-21: qty 10

## 2016-03-22 ENCOUNTER — Inpatient Hospital Stay
Admission: RE | Admit: 2016-03-22 | Discharge: 2016-03-24 | DRG: 470 | Disposition: A | Discharge status: Home Health | Payer: Medicare Other | Source: Ambulatory Visit | Attending: Orthopedic Surgery | Admitting: Orthopedic Surgery

## 2016-03-22 ENCOUNTER — Encounter
Admission: RE | Disposition: A | Discharge status: Home Health | Payer: Self-pay | Source: Ambulatory Visit | Attending: Orthopedic Surgery

## 2016-03-22 ENCOUNTER — Inpatient Hospital Stay: Payer: Medicare Other

## 2016-03-22 ENCOUNTER — Inpatient Hospital Stay: Payer: Medicare Other | Admitting: Anesthesiology

## 2016-03-22 ENCOUNTER — Encounter: Payer: Self-pay | Admitting: Orthopedic Surgery

## 2016-03-22 DIAGNOSIS — I481 Persistent atrial fibrillation: Secondary | ICD-10-CM | POA: Diagnosis present

## 2016-03-22 DIAGNOSIS — Z8546 Personal history of malignant neoplasm of prostate: Secondary | ICD-10-CM

## 2016-03-22 DIAGNOSIS — E785 Hyperlipidemia, unspecified: Secondary | ICD-10-CM | POA: Diagnosis present

## 2016-03-22 DIAGNOSIS — Z7984 Long term (current) use of oral hypoglycemic drugs: Secondary | ICD-10-CM

## 2016-03-22 DIAGNOSIS — I10 Essential (primary) hypertension: Secondary | ICD-10-CM | POA: Diagnosis present

## 2016-03-22 DIAGNOSIS — G4730 Sleep apnea, unspecified: Secondary | ICD-10-CM | POA: Diagnosis present

## 2016-03-22 DIAGNOSIS — E119 Type 2 diabetes mellitus without complications: Secondary | ICD-10-CM | POA: Diagnosis present

## 2016-03-22 DIAGNOSIS — Z950 Presence of cardiac pacemaker: Secondary | ICD-10-CM

## 2016-03-22 DIAGNOSIS — Z85828 Personal history of other malignant neoplasm of skin: Secondary | ICD-10-CM | POA: Diagnosis not present

## 2016-03-22 DIAGNOSIS — M1712 Unilateral primary osteoarthritis, left knee: Secondary | ICD-10-CM | POA: Diagnosis present

## 2016-03-22 DIAGNOSIS — Z96659 Presence of unspecified artificial knee joint: Secondary | ICD-10-CM

## 2016-03-22 DIAGNOSIS — M25562 Pain in left knee: Secondary | ICD-10-CM

## 2016-03-22 DIAGNOSIS — R262 Difficulty in walking, not elsewhere classified: Secondary | ICD-10-CM

## 2016-03-22 DIAGNOSIS — M6281 Muscle weakness (generalized): Secondary | ICD-10-CM

## 2016-03-22 DIAGNOSIS — Z8673 Personal history of transient ischemic attack (TIA), and cerebral infarction without residual deficits: Secondary | ICD-10-CM | POA: Diagnosis not present

## 2016-03-22 DIAGNOSIS — Z79899 Other long term (current) drug therapy: Secondary | ICD-10-CM

## 2016-03-22 HISTORY — PX: KNEE ARTHROPLASTY: SHX992

## 2016-03-22 LAB — PLATELET COUNT: Platelets: 90 10*3/uL — ABNORMAL LOW (ref 150–440)

## 2016-03-22 LAB — GLUCOSE, CAPILLARY
GLUCOSE-CAPILLARY: 104 mg/dL — AB (ref 65–99)
GLUCOSE-CAPILLARY: 172 mg/dL — AB (ref 65–99)
GLUCOSE-CAPILLARY: 246 mg/dL — AB (ref 65–99)
Glucose-Capillary: 128 mg/dL — ABNORMAL HIGH (ref 65–99)

## 2016-03-22 SURGERY — ARTHROPLASTY, KNEE, TOTAL, USING IMAGELESS COMPUTER-ASSISTED NAVIGATION
Anesthesia: General | Laterality: Left | Wound class: Clean

## 2016-03-22 MED ORDER — ALUM & MAG HYDROXIDE-SIMETH 200-200-20 MG/5ML PO SUSP: 30.0000 mL | ORAL | Status: DC | PRN

## 2016-03-22 MED ORDER — METOCLOPRAMIDE HCL 10 MG PO TABS
10.0000 mg | ORAL_TABLET | Freq: Three times a day (TID) | ORAL | Status: DC
  Administered 2016-03-22 – 2016-03-24 (×6): 10 mg via ORAL
  Filled 2016-03-22 (×6): qty 1

## 2016-03-22 MED ORDER — PHENYLEPHRINE HCL 10 MG/ML IJ SOLN
INTRAMUSCULAR | Status: DC | PRN
Start: 2016-03-22 — End: 2016-03-22
  Administered 2016-03-22 (×4): 100 ug via INTRAVENOUS
  Administered 2016-03-22: 200 ug via INTRAVENOUS
  Administered 2016-03-22: 100 ug via INTRAVENOUS
  Administered 2016-03-22 (×2): 200 ug via INTRAVENOUS

## 2016-03-22 MED ORDER — DIPHENHYDRAMINE HCL 12.5 MG/5ML PO ELIX: 12.5000 mg | ORAL_SOLUTION | ORAL | Status: DC | PRN

## 2016-03-22 MED ORDER — FAMOTIDINE 20 MG PO TABS
20.0000 mg | ORAL_TABLET | Freq: Once | ORAL | Status: AC
  Administered 2016-03-22: 20 mg via ORAL

## 2016-03-22 MED ORDER — LIDOCAINE HCL (PF) 2 % IJ SOLN
INTRAMUSCULAR | Status: AC
  Filled 2016-03-22: qty 2

## 2016-03-22 MED ORDER — SUGAMMADEX SODIUM 200 MG/2ML IV SOLN
INTRAVENOUS | Status: DC | PRN
  Administered 2016-03-22: 160 mg via INTRAVENOUS

## 2016-03-22 MED ORDER — LABETALOL HCL 5 MG/ML IV SOLN
INTRAVENOUS | Status: AC
  Filled 2016-03-22: qty 4

## 2016-03-22 MED ORDER — ALFUZOSIN HCL ER 10 MG PO TB24
10.0000 mg | ORAL_TABLET | Freq: Every day | ORAL | Status: DC
  Administered 2016-03-23 – 2016-03-24 (×2): 10 mg via ORAL
  Filled 2016-03-22 (×3): qty 1

## 2016-03-22 MED ORDER — MIDAZOLAM HCL 2 MG/2ML IJ SOLN
INTRAMUSCULAR | Status: AC
  Filled 2016-03-22: qty 2

## 2016-03-22 MED ORDER — HYDROMORPHONE HCL 1 MG/ML IJ SOLN
INTRAMUSCULAR | Status: AC
  Filled 2016-03-22: qty 1

## 2016-03-22 MED ORDER — ACETAMINOPHEN 10 MG/ML IV SOLN
1000.0000 mg | Freq: Four times a day (QID) | INTRAVENOUS | Status: AC
  Administered 2016-03-22 – 2016-03-23 (×4): 1000 mg via INTRAVENOUS
  Filled 2016-03-22 (×4): qty 100

## 2016-03-22 MED ORDER — SODIUM CHLORIDE 0.9 % IV SOLN
INTRAVENOUS | Status: DC | PRN
  Administered 2016-03-22: 60 mL

## 2016-03-22 MED ORDER — CEFAZOLIN SODIUM-DEXTROSE 2-4 GM/100ML-% IV SOLN
2.0000 g | Freq: Four times a day (QID) | INTRAVENOUS | Status: DC
  Filled 2016-03-22 (×4): qty 100

## 2016-03-22 MED ORDER — METFORMIN HCL 500 MG PO TABS
500.0000 mg | ORAL_TABLET | Freq: Every day | ORAL | Status: DC
  Administered 2016-03-23 – 2016-03-24 (×2): 500 mg via ORAL
  Filled 2016-03-22 (×2): qty 1

## 2016-03-22 MED ORDER — PROPOFOL 10 MG/ML IV BOLUS
INTRAVENOUS | Status: DC | PRN
  Administered 2016-03-22: 30 mg via INTRAVENOUS
  Administered 2016-03-22: 60 mg via INTRAVENOUS
  Administered 2016-03-22: 130 mg via INTRAVENOUS

## 2016-03-22 MED ORDER — FENTANYL CITRATE (PF) 100 MCG/2ML IJ SOLN
INTRAMUSCULAR | Status: AC
  Filled 2016-03-22: qty 2

## 2016-03-22 MED ORDER — DEXAMETHASONE SODIUM PHOSPHATE 10 MG/ML IJ SOLN
INTRAMUSCULAR | Status: AC
  Filled 2016-03-22: qty 1

## 2016-03-22 MED ORDER — HYDRALAZINE HCL 20 MG/ML IJ SOLN
INTRAMUSCULAR | Status: AC
  Filled 2016-03-22: qty 1

## 2016-03-22 MED ORDER — SUGAMMADEX SODIUM 200 MG/2ML IV SOLN
INTRAVENOUS | Status: AC
  Filled 2016-03-22: qty 2

## 2016-03-22 MED ORDER — ATORVASTATIN CALCIUM 20 MG PO TABS
40.0000 mg | ORAL_TABLET | Freq: Every day | ORAL | Status: DC
  Administered 2016-03-22 – 2016-03-23 (×2): 40 mg via ORAL
  Filled 2016-03-22 (×2): qty 2

## 2016-03-22 MED ORDER — GLYCOPYRROLATE 0.2 MG/ML IJ SOLN
INTRAMUSCULAR | Status: AC
  Filled 2016-03-22: qty 1

## 2016-03-22 MED ORDER — RIVAROXABAN 20 MG PO TABS
20.0000 mg | ORAL_TABLET | Freq: Every day | ORAL | Status: DC
  Administered 2016-03-23 – 2016-03-24 (×2): 20 mg via ORAL
  Filled 2016-03-22 (×2): qty 1

## 2016-03-22 MED ORDER — BUPIVACAINE HCL (PF) 0.25 % IJ SOLN
INTRAMUSCULAR | Status: DC | PRN
  Administered 2016-03-22: 60 mL

## 2016-03-22 MED ORDER — ONDANSETRON HCL 4 MG PO TABS: 4.0000 mg | ORAL_TABLET | Freq: Four times a day (QID) | ORAL | Status: DC | PRN

## 2016-03-22 MED ORDER — CEFAZOLIN SODIUM-DEXTROSE 2-3 GM-% IV SOLR
INTRAVENOUS | Status: DC | PRN
  Administered 2016-03-22: 2 g via INTRAVENOUS

## 2016-03-22 MED ORDER — LIDOCAINE HCL (CARDIAC) 20 MG/ML IV SOLN
INTRAVENOUS | Status: DC | PRN
  Administered 2016-03-22: 100 mg via INTRAVENOUS

## 2016-03-22 MED ORDER — ONDANSETRON HCL 4 MG/2ML IJ SOLN
INTRAMUSCULAR | Status: DC | PRN
  Administered 2016-03-22: 4 mg via INTRAVENOUS

## 2016-03-22 MED ORDER — TRANEXAMIC ACID 1000 MG/10ML IV SOLN
INTRAVENOUS | Status: DC | PRN
  Administered 2016-03-22: 1000 mg via INTRAVENOUS

## 2016-03-22 MED ORDER — ONDANSETRON HCL 4 MG/2ML IJ SOLN: 4.0000 mg | Freq: Once | INTRAMUSCULAR | Status: DC | PRN

## 2016-03-22 MED ORDER — OXYCODONE HCL 5 MG PO TABS
5.0000 mg | ORAL_TABLET | ORAL | Status: DC | PRN
  Administered 2016-03-22: 5 mg via ORAL
  Filled 2016-03-22: qty 1

## 2016-03-22 MED ORDER — PHENOL 1.4 % MT LIQD
1.0000 | OROMUCOSAL | Status: DC | PRN
Start: 2016-03-22 — End: 2016-03-24
  Filled 2016-03-22: qty 177

## 2016-03-22 MED ORDER — ACETAMINOPHEN 325 MG PO TABS: 650.0000 mg | ORAL_TABLET | Freq: Four times a day (QID) | ORAL | Status: DC | PRN

## 2016-03-22 MED ORDER — NEOMYCIN-POLYMYXIN B GU 40-200000 IR SOLN
Status: AC
  Filled 2016-03-22: qty 20

## 2016-03-22 MED ORDER — FENTANYL CITRATE (PF) 100 MCG/2ML IJ SOLN
25.0000 ug | INTRAMUSCULAR | Status: AC | PRN
  Administered 2016-03-22 (×6): 25 ug via INTRAVENOUS

## 2016-03-22 MED ORDER — SUCCINYLCHOLINE CHLORIDE 20 MG/ML IJ SOLN
INTRAMUSCULAR | Status: DC | PRN
  Administered 2016-03-22: 80 mg via INTRAVENOUS

## 2016-03-22 MED ORDER — ROCURONIUM BROMIDE 100 MG/10ML IV SOLN
INTRAVENOUS | Status: DC | PRN
  Administered 2016-03-22: 50 mg via INTRAVENOUS

## 2016-03-22 MED ORDER — BUPIVACAINE HCL (PF) 0.25 % IJ SOLN
INTRAMUSCULAR | Status: AC
  Filled 2016-03-22: qty 60

## 2016-03-22 MED ORDER — ACETAMINOPHEN 10 MG/ML IV SOLN
INTRAVENOUS | Status: DC | PRN
  Administered 2016-03-22: 1000 mg via INTRAVENOUS

## 2016-03-22 MED ORDER — PHENYLEPHRINE HCL 10 MG/ML IJ SOLN
INTRAMUSCULAR | Status: AC
  Filled 2016-03-22: qty 1

## 2016-03-22 MED ORDER — BUPIVACAINE LIPOSOME 1.3 % IJ SUSP
INTRAMUSCULAR | Status: AC
  Filled 2016-03-22: qty 20

## 2016-03-22 MED ORDER — SODIUM CHLORIDE 0.9 % IV SOLN
INTRAVENOUS | Status: DC
  Administered 2016-03-22 (×2): via INTRAVENOUS

## 2016-03-22 MED ORDER — SODIUM CHLORIDE 0.9 % IJ SOLN
INTRAMUSCULAR | Status: AC
  Filled 2016-03-22: qty 50

## 2016-03-22 MED ORDER — MAGNESIUM HYDROXIDE 400 MG/5ML PO SUSP
30.0000 mL | Freq: Every day | ORAL | Status: DC | PRN
  Filled 2016-03-22: qty 30

## 2016-03-22 MED ORDER — TRAMADOL HCL 50 MG PO TABS
50.0000 mg | ORAL_TABLET | ORAL | Status: DC | PRN
  Administered 2016-03-23: 100 mg via ORAL
  Filled 2016-03-22: qty 2

## 2016-03-22 MED ORDER — PROPOFOL 500 MG/50ML IV EMUL
INTRAVENOUS | Status: AC
  Filled 2016-03-22: qty 50

## 2016-03-22 MED ORDER — PANTOPRAZOLE SODIUM 40 MG PO TBEC
40.0000 mg | DELAYED_RELEASE_TABLET | Freq: Two times a day (BID) | ORAL | Status: DC
  Administered 2016-03-22 – 2016-03-24 (×4): 40 mg via ORAL
  Filled 2016-03-22 (×4): qty 1

## 2016-03-22 MED ORDER — HYDROMORPHONE HCL 1 MG/ML IJ SOLN
0.5000 mg | INTRAMUSCULAR | Status: DC | PRN
  Administered 2016-03-22 (×2): 0.5 mg via INTRAVENOUS

## 2016-03-22 MED ORDER — SODIUM CHLORIDE 0.9 % IV SOLN
INTRAVENOUS | Status: DC
  Administered 2016-03-22 – 2016-03-23 (×2): via INTRAVENOUS

## 2016-03-22 MED ORDER — INSULIN ASPART 100 UNIT/ML ~~LOC~~ SOLN
0.0000 [IU] | Freq: Three times a day (TID) | SUBCUTANEOUS | Status: DC
  Administered 2016-03-23 (×2): 2 [IU] via SUBCUTANEOUS
  Administered 2016-03-23: 3 [IU] via SUBCUTANEOUS
  Filled 2016-03-22: qty 3
  Filled 2016-03-22 (×2): qty 2

## 2016-03-22 MED ORDER — FLEET ENEMA 7-19 GM/118ML RE ENEM: 1.0000 | ENEMA | Freq: Once | RECTAL | Status: DC | PRN

## 2016-03-22 MED ORDER — PROPOFOL 500 MG/50ML IV EMUL
INTRAVENOUS | Status: DC | PRN
Start: 2016-03-22 — End: 2016-03-22
  Administered 2016-03-22: 100 ug/kg/min via INTRAVENOUS

## 2016-03-22 MED ORDER — MIDAZOLAM HCL 5 MG/5ML IJ SOLN
INTRAMUSCULAR | Status: DC | PRN
  Administered 2016-03-22: .5 mg via INTRAVENOUS

## 2016-03-22 MED ORDER — BISACODYL 10 MG RE SUPP: 10.0000 mg | Freq: Every day | RECTAL | Status: DC | PRN

## 2016-03-22 MED ORDER — FENTANYL CITRATE (PF) 250 MCG/5ML IJ SOLN
INTRAMUSCULAR | Status: AC
  Filled 2016-03-22: qty 5

## 2016-03-22 MED ORDER — CHLORHEXIDINE GLUCONATE 4 % EX LIQD: 60.0000 mL | Freq: Once | CUTANEOUS | Status: DC

## 2016-03-22 MED ORDER — ONDANSETRON HCL 4 MG/2ML IJ SOLN: 4.0000 mg | Freq: Four times a day (QID) | INTRAMUSCULAR | Status: DC | PRN

## 2016-03-22 MED ORDER — HYDRALAZINE HCL 20 MG/ML IJ SOLN
INTRAMUSCULAR | Status: DC | PRN
  Administered 2016-03-22: 10 mg via INTRAVENOUS

## 2016-03-22 MED ORDER — ACETAMINOPHEN 10 MG/ML IV SOLN
INTRAVENOUS | Status: AC
  Filled 2016-03-22: qty 100

## 2016-03-22 MED ORDER — CELECOXIB 200 MG PO CAPS
200.0000 mg | ORAL_CAPSULE | Freq: Two times a day (BID) | ORAL | Status: DC
  Administered 2016-03-22 – 2016-03-24 (×4): 200 mg via ORAL
  Filled 2016-03-22 (×4): qty 1

## 2016-03-22 MED ORDER — MORPHINE SULFATE (PF) 2 MG/ML IV SOLN: 2.0000 mg | INTRAVENOUS | Status: DC | PRN

## 2016-03-22 MED ORDER — FENTANYL CITRATE (PF) 100 MCG/2ML IJ SOLN
INTRAMUSCULAR | Status: DC | PRN
  Administered 2016-03-22: 100 ug via INTRAVENOUS
  Administered 2016-03-22: 50 ug via INTRAVENOUS
  Administered 2016-03-22 (×2): 100 ug via INTRAVENOUS

## 2016-03-22 MED ORDER — MONTELUKAST SODIUM 10 MG PO TABS
10.0000 mg | ORAL_TABLET | Freq: Every day | ORAL | Status: DC
  Administered 2016-03-22 – 2016-03-23 (×2): 10 mg via ORAL
  Filled 2016-03-22 (×2): qty 1

## 2016-03-22 MED ORDER — FAMOTIDINE 20 MG PO TABS
ORAL_TABLET | ORAL | Status: AC
  Administered 2016-03-22: 20 mg via ORAL
  Filled 2016-03-22: qty 1

## 2016-03-22 MED ORDER — CEFAZOLIN SODIUM-DEXTROSE 2-3 GM-% IV SOLR
2.0000 g | Freq: Four times a day (QID) | INTRAVENOUS | Status: AC
Start: 2016-03-22 — End: 2016-03-23
  Administered 2016-03-22 – 2016-03-23 (×4): 2 g via INTRAVENOUS
  Filled 2016-03-22 (×4): qty 50

## 2016-03-22 MED ORDER — ACETAMINOPHEN 650 MG RE SUPP: 650.0000 mg | Freq: Four times a day (QID) | RECTAL | Status: DC | PRN

## 2016-03-22 MED ORDER — NEOMYCIN-POLYMYXIN B GU 40-200000 IR SOLN
Status: DC | PRN
  Administered 2016-03-22: 2 mL

## 2016-03-22 MED ORDER — CEFAZOLIN SODIUM-DEXTROSE 2-4 GM/100ML-% IV SOLN
INTRAVENOUS | Status: AC
  Filled 2016-03-22: qty 100

## 2016-03-22 MED ORDER — LABETALOL HCL 5 MG/ML IV SOLN
INTRAVENOUS | Status: DC | PRN
  Administered 2016-03-22: 5 mg via INTRAVENOUS

## 2016-03-22 MED ORDER — METOPROLOL SUCCINATE ER 25 MG PO TB24
25.0000 mg | ORAL_TABLET | Freq: Every day | ORAL | Status: DC
Start: 2016-03-23 — End: 2016-03-24
  Administered 2016-03-23 – 2016-03-24 (×2): 25 mg via ORAL
  Filled 2016-03-22 (×2): qty 1

## 2016-03-22 MED ORDER — TRANEXAMIC ACID 1000 MG/10ML IV SOLN
1000.0000 mg | Freq: Once | INTRAVENOUS | Status: AC
  Administered 2016-03-22: 1000 mg via INTRAVENOUS
  Filled 2016-03-22: qty 10

## 2016-03-22 MED ORDER — SENNOSIDES-DOCUSATE SODIUM 8.6-50 MG PO TABS
1.0000 | ORAL_TABLET | Freq: Two times a day (BID) | ORAL | Status: DC
  Administered 2016-03-22 – 2016-03-24 (×4): 1 via ORAL
  Filled 2016-03-22 (×4): qty 1

## 2016-03-22 MED ORDER — FERROUS SULFATE 325 (65 FE) MG PO TABS
325.0000 mg | ORAL_TABLET | Freq: Two times a day (BID) | ORAL | Status: DC
  Administered 2016-03-22 – 2016-03-24 (×4): 325 mg via ORAL
  Filled 2016-03-22 (×4): qty 1

## 2016-03-22 MED ORDER — MENTHOL 3 MG MT LOZG
1.0000 | LOZENGE | OROMUCOSAL | Status: DC | PRN
  Filled 2016-03-22: qty 9

## 2016-03-22 SURGICAL SUPPLY — 61 items
AUTOTRANSFUS HAS 1/8 (MISCELLANEOUS) ×2
BATTERY INSTRU NAVIGATION (MISCELLANEOUS) ×8 IMPLANT
BLADE SAW 1 (BLADE) ×2 IMPLANT
BLADE SAW 1/2 (BLADE) ×2 IMPLANT
BLADE SAW 70X12.5 (BLADE) IMPLANT
BONE CEMENT GENTAMICIN (Cement) ×4 IMPLANT
CANISTER SUCT 1200ML W/VALVE (MISCELLANEOUS) ×2 IMPLANT
CANISTER SUCT 3000ML (MISCELLANEOUS) ×4 IMPLANT
CAPT KNEE TOTAL 3 ATTUNE ×2 IMPLANT
CATH TRAY METER 16FR LF (MISCELLANEOUS) ×2 IMPLANT
CEMENT BONE GENTAMICIN 40 (Cement) ×2 IMPLANT
COOLER POLAR GLACIER W/PUMP (MISCELLANEOUS) ×2 IMPLANT
CUFF TOURN 24 STER (MISCELLANEOUS) ×2 IMPLANT
DRAPE SHEET LG 3/4 BI-LAMINATE (DRAPES) ×2 IMPLANT
DRSG DERMACEA 8X12 NADH (GAUZE/BANDAGES/DRESSINGS) ×2 IMPLANT
DRSG OPSITE POSTOP 4X14 (GAUZE/BANDAGES/DRESSINGS) ×4 IMPLANT
DRSG TEGADERM 2-3/8X2-3/4 SM (GAUZE/BANDAGES/DRESSINGS) ×2 IMPLANT
DRSG TEGADERM 4X4.75 (GAUZE/BANDAGES/DRESSINGS) ×2 IMPLANT
DURAPREP 26ML APPLICATOR (WOUND CARE) ×4 IMPLANT
ELECT CAUTERY BLADE 6.4 (BLADE) ×2 IMPLANT
ELECT REM PT RETURN 9FT ADLT (ELECTROSURGICAL) ×2
ELECTRODE REM PT RTRN 9FT ADLT (ELECTROSURGICAL) ×1 IMPLANT
EX-PIN ORTHOLOCK NAV 4X150 (PIN) ×4 IMPLANT
GLOVE BIOGEL M STRL SZ7.5 (GLOVE) ×4 IMPLANT
GLOVE INDICATOR 8.0 STRL GRN (GLOVE) ×2 IMPLANT
GLOVE SURG 9.0 ORTHO LTXF (GLOVE) ×2 IMPLANT
GLOVE SURG ORTHO 9.0 STRL STRW (GLOVE) ×2 IMPLANT
GOWN STRL REUS W/ TWL LRG LVL3 (GOWN DISPOSABLE) ×2 IMPLANT
GOWN STRL REUS W/TWL 2XL LVL3 (GOWN DISPOSABLE) ×2 IMPLANT
GOWN STRL REUS W/TWL LRG LVL3 (GOWN DISPOSABLE) ×2
HANDPIECE INTERPULSE COAX TIP (DISPOSABLE) ×1
HOLDER FOLEY CATH W/STRAP (MISCELLANEOUS) ×2 IMPLANT
HOOD PEEL AWAY FLYTE STAYCOOL (MISCELLANEOUS) ×4 IMPLANT
KIT RM TURNOVER STRD PROC AR (KITS) ×2 IMPLANT
KNIFE SCULPS 14X20 (INSTRUMENTS) ×2 IMPLANT
LABEL OR SOLS (LABEL) ×2 IMPLANT
NDL SAFETY 18GX1.5 (NEEDLE) ×2 IMPLANT
NEEDLE SPNL 20GX3.5 QUINCKE YW (NEEDLE) ×2 IMPLANT
NS IRRIG 500ML POUR BTL (IV SOLUTION) ×2 IMPLANT
PACK TOTAL KNEE (MISCELLANEOUS) ×2 IMPLANT
PAD WRAPON POLAR KNEE (MISCELLANEOUS) ×1 IMPLANT
PIN DRILL QUICK PACK ×2 IMPLANT
PIN FIXATION 1/8DIA X 3INL (PIN) ×2 IMPLANT
SET HNDPC FAN SPRY TIP SCT (DISPOSABLE) ×1 IMPLANT
SOL .9 NS 3000ML IRR  AL (IV SOLUTION) ×1
SOL .9 NS 3000ML IRR UROMATIC (IV SOLUTION) ×1 IMPLANT
SOL PREP PVP 2OZ (MISCELLANEOUS) ×2
SOLUTION PREP PVP 2OZ (MISCELLANEOUS) ×1 IMPLANT
SPONGE DRAIN TRACH 4X4 STRL 2S (GAUZE/BANDAGES/DRESSINGS) ×4 IMPLANT
STAPLER SKIN PROX 35W (STAPLE) ×2 IMPLANT
SUCTION FRAZIER HANDLE 10FR (MISCELLANEOUS) ×1
SUCTION TUBE FRAZIER 10FR DISP (MISCELLANEOUS) ×1 IMPLANT
SUT VIC AB 0 CT1 36 (SUTURE) ×2 IMPLANT
SUT VIC AB 1 CT1 36 (SUTURE) ×4 IMPLANT
SUT VIC AB 2-0 CT2 27 (SUTURE) ×2 IMPLANT
SYR 20CC LL (SYRINGE) ×2 IMPLANT
SYR 30ML LL (SYRINGE) ×4 IMPLANT
SYSTEM AUTOTRANSFUS DUAL TROCR (MISCELLANEOUS) ×1 IMPLANT
TOWEL OR 17X26 4PK STRL BLUE (TOWEL DISPOSABLE) ×2 IMPLANT
TOWER CARTRIDGE SMART MIX (DISPOSABLE) ×2 IMPLANT
WRAPON POLAR PAD KNEE (MISCELLANEOUS) ×2

## 2016-03-22 NOTE — Transfer of Care (Signed)
Immediate Anesthesia Transfer of Care Note  Patient: Nicholas Patel  Procedure(s) Performed: Procedure(s): COMPUTER ASSISTED TOTAL KNEE ARTHROPLASTY (Left)  Patient Location: PACU  Anesthesia Type:General  Level of Consciousness: awake  Airway & Oxygen Therapy: Patient Spontanous Breathing and Patient connected to face mask oxygen  Post-op Assessment: Report given to RN and Post -op Vital signs reviewed and stable  Post vital signs: Reviewed and stable  Last Vitals:  Vitals:   03/22/16 1004  BP: 124/81  Pulse: 74  Resp: 16  Temp: 36.6 C    Last Pain:  Vitals:   03/22/16 1004  TempSrc: Oral         Complications: No apparent anesthesia complications

## 2016-03-22 NOTE — Brief Op Note (Signed)
03/22/2016  4:16 PM  PATIENT:  Nicholas Patel  83 y.o. male  PRE-OPERATIVE DIAGNOSIS:  PRIMARY OSTEOARTHRITIS OF LEFT KNEE  POST-OPERATIVE DIAGNOSIS:  PRIMARY OSTEOARTHRITIS OF LEFT KNEE  PROCEDURE:  Procedure(s): COMPUTER ASSISTED TOTAL KNEE ARTHROPLASTY (Left)  SURGEON:  Surgeon(s) and Role:    * Dereck Leep, MD - Primary  ASSISTANTS: Vance Peper, PA   ANESTHESIA:   general  EBL:  Total I/O In: 1250 [I.V.:1250] Out: 350 [Urine:250; Blood:100]  BLOOD ADMINISTERED:none  DRAINS: 2 medium drains to a reinfusion system   LOCAL MEDICATIONS USED:  MARCAINE    and OTHER Exparel  SPECIMEN:  No Specimen  DISPOSITION OF SPECIMEN:  N/A  COUNTS:  YES  TOURNIQUET:    DICTATION: .Dragon Dictation  PLAN OF CARE: Admit to inpatient   PATIENT DISPOSITION:  PACU - hemodynamically stable.   Delay start of Pharmacological VTE agent (>24hrs) due to surgical blood loss or risk of bleeding: yes

## 2016-03-22 NOTE — Anesthesia Procedure Notes (Addendum)
Procedure Name: Intubation Date/Time: 03/22/2016 12:59 PM Performed by: Rosaria Ferries, Shakhia Gramajo Pre-anesthesia Checklist: Patient identified, Emergency Drugs available, Suction available and Patient being monitored Patient Re-evaluated:Patient Re-evaluated prior to inductionOxygen Delivery Method: Circle system utilized Preoxygenation: Pre-oxygenation with 100% oxygen Intubation Type: IV induction Laryngoscope Size: Mac and 3 Grade View: Grade II Tube size: 7.0 mm Airway Equipment and Method: Bougie stylet Placement Confirmation: ETT inserted through vocal cords under direct vision,  positive ETCO2 and breath sounds checked- equal and bilateral Secured at: 22 cm Tube secured with: Tape

## 2016-03-22 NOTE — Anesthesia Preprocedure Evaluation (Addendum)
Anesthesia Evaluation  Patient identified by MRN, date of birth, ID band Patient awake    Reviewed: Allergy & Precautions, NPO status , Patient's Chart, lab work & pertinent test results, reviewed documented beta blocker date and time   Airway Mallampati: IV  TM Distance: <3 FB   Mouth opening: Limited Mouth Opening  Dental  (+) Chipped   Pulmonary sleep apnea ,    Pulmonary exam normal        Cardiovascular hypertension, Pt. on medications and Pt. on home beta blockers + dysrhythmias Atrial Fibrillation + pacemaker + Valvular Problems/Murmurs      Neuro/Psych TIAnegative psych ROS   GI/Hepatic negative GI ROS, Neg liver ROS, diverticulosis   Endo/Other  diabetes, Well Controlled, Type 2, Oral Hypoglycemic Agents  Renal/GU negative Renal ROS  negative genitourinary   Musculoskeletal  (+) Arthritis ,   Abdominal Normal abdominal exam  (+)   Peds negative pediatric ROS (+)  Hematology negative hematology ROS (+)   Anesthesia Other Findings Past Medical History: No date: Atrial fibrillation (HCC) No date: Broken neck (HCC) No date: Chicken pox No date: Diabetes mellitus without complication (HCC) No date: Diverticulosis No date: Heart murmur     Comment: as child No date: Hyperlipidemia No date: Hypertension No date: Prostate cancer ([place]) No date: Prostate cancer ([place]) No date: Skin cancer No date: Sleep apnea 2009: TIA (transient ischemic attack)  Reproductive/Obstetrics                            Anesthesia Physical Anesthesia Plan  ASA: III  Anesthesia Plan: General   Post-op Pain Management:    Induction: Intravenous  Airway Management Planned: Oral ETT  Additional Equipment:   Intra-op Plan:   Post-operative Plan: Extubation in OR  Informed Consent: I have reviewed the patients History and Physical, chart, labs and discussed the procedure including the risks,  benefits and alternatives for the proposed anesthesia with the patient or authorized representative who has indicated his/her understanding and acceptance.   Dental advisory given  Plan Discussed with: CRNA and Surgeon  Anesthesia Plan Comments: (Talked with patient and son in depth about the risks/benefit of both anesthetic choices( Regional vs. GOT )  .   Patient and son understand the risks and benefits and understand that with a platelet count of 90,000, down from 103,000, we will need to go with a GA, as opposed to a spinal, as per our protocol.)      Anesthesia Quick Evaluation

## 2016-03-22 NOTE — Progress Notes (Signed)
Patient arrived to the floor, alert and oriented, stated pain is trending down at a 4/10 level. Family at bedside. Admission assessment completed. Skin check with second nurse present. Patient on telemetry. Will continue to monitor.

## 2016-03-22 NOTE — Op Note (Signed)
OPERATIVE NOTE  DATE OF SURGERY:  03/22/2016  PATIENT NAME:  LOMAR MEADOR   DOB: [birth year]  MRN: [id number]  PRE-OPERATIVE DIAGNOSIS: Degenerative arthrosis of the left knee, primary  POST-OPERATIVE DIAGNOSIS:  Same  PROCEDURE:  Left total knee arthroplasty using computer-assisted navigation  SURGEON:  Marciano Sequin. M.D.  ASSISTANT:  Vance Peper, PA (present and scrubbed throughout the case, critical for assistance with exposure, retraction, instrumentation, and closure)  ANESTHESIA: general  ESTIMATED BLOOD LOSS: 100 mL  FLUIDS REPLACED: 1250 mL of crystalloid  TOURNIQUET TIME: 88 minutes  DRAINS: 2 medium drains to a reinfusion system  SOFT TISSUE RELEASES: Anterior cruciate ligament, posterior cruciate ligament, deep medial collateral ligament, patellofemoral ligament  IMPLANTS UTILIZED: DePuy Attune size 7 posterior stabilized femoral component (cemented), size 8 rotating platform tibial component (cemented), 41 mm medialized dome patella (cemented), and a 5 mm stabilized rotating platform polyethylene insert.  INDICATIONS FOR SURGERY: DEQUANDRE NORLAND is a 83 y.o. year old male with a long history of progressive knee pain. X-rays demonstrated severe degenerative changes in tricompartmental fashion. The patient had not seen any significant improvement despite conservative nonsurgical intervention. After discussion of the risks and benefits of surgical intervention, the patient expressed understanding of the risks benefits and agree with plans for total knee arthroplasty.   The risks, benefits, and alternatives were discussed at length including but not limited to the risks of infection, bleeding, nerve injury, stiffness, blood clots, the need for revision surgery, cardiopulmonary complications, among others, and they were willing to proceed.  PROCEDURE IN DETAIL: The patient was brought into the operating room and, after adequate general anesthesia was achieved, a  tourniquet was placed on the patient's upper thigh. The patient's knee and leg were cleaned and prepped with alcohol and DuraPrep and draped in the usual sterile fashion. A "timeout" was performed as per usual protocol. The lower extremity was exsanguinated using an Esmarch, and the tourniquet was inflated to 300 mmHg. An anterior longitudinal incision was made followed by a standard mid vastus approach. The deep fibers of the medial collateral ligament were elevated in a subperiosteal fashion off of the medial flare of the tibia so as to maintain a continuous soft tissue sleeve. The patella was subluxed laterally and the patellofemoral ligament was incised. Inspection of the knee demonstrated severe degenerative changes with full-thickness loss of articular cartilage. Osteophytes were debrided using a rongeur. Anterior and posterior cruciate ligaments were excised. Two 4.0 mm Schanz pins were inserted in the femur and into the tibia for attachment of the array of trackers used for computer-assisted navigation. Hip center was identified using a circumduction technique. Distal landmarks were mapped using the computer. The distal femur and proximal tibia were mapped using the computer. The distal femoral cutting guide was positioned using computer-assisted navigation so as to achieve a 5 distal valgus cut. The femur was sized and it was felt that a size 7 femoral component was appropriate. A size 7 femoral cutting guide was positioned and the anterior cut was performed and verified using the computer. This was followed by completion of the posterior and chamfer cuts. Femoral cutting guide for the central box was then positioned in the center box cut was performed.  Attention was then directed to the proximal tibia. Medial and lateral menisci were excised. The extramedullary tibial cutting guide was positioned using computer-assisted navigation so as to achieve a 0 varus-valgus alignment and 3 posterior slope. The  cut was performed and verified using the computer.  The proximal tibia was sized and it was felt that a size 8 tibial tray was appropriate. Tibial and femoral trials were inserted followed by insertion of a 5 mm polyethylene insert. This allowed for excellent mediolateral soft tissue balancing both in flexion and in full extension. Finally, the patella was cut and prepared so as to accommodate a 41 mm medialized dome patella. A patella trial was placed and the knee was placed through a range of motion with excellent patellar tracking appreciated. The femoral trial was removed after debridement of posterior osteophytes. The central post-hole for the tibial component was reamed followed by insertion of a keel punch. Tibial trials were then removed. Cut surfaces of bone were irrigated with copious amounts of normal saline with antibiotic solution using pulsatile lavage and then suctioned dry. Polymethylmethacrylate cement with gentamicin was prepared in the usual fashion using a vacuum mixer. Cement was applied to the cut surface of the proximal tibia as well as along the undersurface of a size 8 rotating platform tibial component. Tibial component was positioned and impacted into place. Excess cement was removed using [employer] fast streamer. Cement was then applied to the cut surfaces of the femur as well as along the posterior flanges of the size 7 femoral component. The femoral component was positioned and impacted into place. Excess cement was removed using [employer] fast streamer. A 5 mm polyethylene trial was inserted and the knee was brought into full extension with steady axial compression applied. Finally, cement was applied to the backside of a 41 mm medialized dome patella and the patellar component was positioned and patellar clamp applied. Excess cement was removed using [employer] fast streamer. After adequate curing of the cement, the tourniquet was deflated after a total tourniquet time of 88 minutes. Hemostasis was achieved  using electrocautery. The knee was irrigated with copious amounts of normal saline with antibiotic solution using pulsatile lavage and then suctioned dry. 20 mL of 1.3% Exparel and 60 mL of 0.25% Marcaine in 40 mL of normal saline was injected along the posterior capsule, medial and lateral gutters, and along the arthrotomy site. A 5 mm stabilized rotating platform polyethylene insert was inserted and the knee was placed through a range of motion with excellent mediolateral soft tissue balancing appreciated and excellent patellar tracking noted. 2 medium drains were placed in the wound bed and brought out through separate stab incisions to be attached to a reinfusion system. The medial parapatellar portion of the incision was reapproximated using interrupted sutures of #1 Vicryl. Subcutaneous tissue was approximated in layers using first #0 Vicryl followed #2-0 Vicryl. The skin was approximated with skin staples. A sterile dressing was applied.  The patient tolerated the procedure well and was transported to the recovery room in stable condition.    James P. Holley Bouche., M.D.

## 2016-03-22 NOTE — H&P (Signed)
The patient has been re-examined, and the chart reviewed, and there have been no interval changes to the documented history and physical.    The risks, benefits, and alternatives have been discussed at length. The patient expressed understanding of the risks benefits and agreed with plans for surgical intervention.  Shadoe Cryan P. Fabion Gatson, Jr. M.D.    

## 2016-03-22 NOTE — Anesthesia Post-op Follow-up Note (Cosign Needed)
Anesthesia QCDR form completed.        

## 2016-03-22 NOTE — Anesthesia Postprocedure Evaluation (Signed)
Anesthesia Post Note  Patient: Nicholas Patel  Procedure(s) Performed: Procedure(s) (LRB): COMPUTER ASSISTED TOTAL KNEE ARTHROPLASTY (Left)  Patient location during evaluation: PACU Anesthesia Type: General Level of consciousness: awake and alert Pain management: pain level controlled Vital Signs Assessment: post-procedure vital signs reviewed and stable Respiratory status: spontaneous breathing, nonlabored ventilation, respiratory function stable and patient connected to nasal cannula oxygen Cardiovascular status: blood pressure returned to baseline and stable Postop Assessment: no signs of nausea or vomiting Anesthetic complications: no     Last Vitals:  Vitals:   03/22/16 1831 03/22/16 1916  BP: 129/78 132/66  Pulse: 93 (!) 101  Resp:  19  Temp: 36.6 C 37 C    Last Pain:  Vitals:   03/22/16 1917  TempSrc:   PainSc: [place]

## 2016-03-22 NOTE — Anesthesia Postprocedure Evaluation (Signed)
Anesthesia Post Note  Patient: Nicholas Patel  Procedure(s) Performed: Procedure(s) (LRB): COMPUTER ASSISTED TOTAL KNEE ARTHROPLASTY (Left)  Patient location during evaluation: PACU Anesthesia Type: General Level of consciousness: awake and alert Pain management: pain level controlled Vital Signs Assessment: post-procedure vital signs reviewed and stable Respiratory status: spontaneous breathing, nonlabored ventilation, respiratory function stable and patient connected to nasal cannula oxygen Cardiovascular status: blood pressure returned to baseline and stable Postop Assessment: no signs of nausea or vomiting Anesthetic complications: no     Last Vitals:  Vitals:   03/22/16 1831 03/22/16 1916  BP: 129/78 132/66  Pulse: 93 (!) 101  Resp:  19  Temp: 36.6 C 37 C    Last Pain:  Vitals:   03/22/16 1917  TempSrc:   PainSc: [place]

## 2016-03-23 ENCOUNTER — Encounter: Payer: Self-pay | Admitting: Orthopedic Surgery

## 2016-03-23 LAB — GLUCOSE, CAPILLARY
GLUCOSE-CAPILLARY: 154 mg/dL — AB (ref 65–99)
GLUCOSE-CAPILLARY: 122 mg/dL — AB (ref 65–99)
Glucose-Capillary: 127 mg/dL — ABNORMAL HIGH (ref 65–99)
Glucose-Capillary: 89 mg/dL (ref 65–99)

## 2016-03-23 LAB — CBC
HEMATOCRIT: 28.6 % — AB (ref 40.0–52.0)
HEMOGLOBIN: 9.7 g/dL — AB (ref 13.0–18.0)
MCH: 32.2 pg (ref 26.0–34.0)
MCHC: 34 g/dL (ref 32.0–36.0)
MCV: 94.7 fL (ref 80.0–100.0)
Platelets: 78 10*3/uL — ABNORMAL LOW (ref 150–440)
RBC: 3.02 MIL/uL — ABNORMAL LOW (ref 4.40–5.90)
RDW: 19.4 % — AB (ref 11.5–14.5)
WBC: 8.7 10*3/uL (ref 3.8–10.6)

## 2016-03-23 LAB — BASIC METABOLIC PANEL
ANION GAP: 7 (ref 5–15)
BUN: 22 mg/dL — ABNORMAL HIGH (ref 6–20)
CALCIUM: 7.8 mg/dL — AB (ref 8.9–10.3)
CHLORIDE: 105 mmol/L (ref 101–111)
CO2: 20 mmol/L — AB (ref 22–32)
Creatinine, Ser: 0.92 mg/dL (ref 0.61–1.24)
GFR calc non Af Amer: >60 mL/min (ref >60)
GLUCOSE: 164 mg/dL — AB (ref 65–99)
POTASSIUM: 4.3 mmol/L (ref 3.5–5.1)
Sodium: 132 mmol/L — ABNORMAL LOW (ref 135–145)

## 2016-03-23 MED ORDER — PSYLLIUM 95 % PO PACK
1.0000 | PACK | Freq: Every day | ORAL | Status: DC
  Administered 2016-03-23 – 2016-03-24 (×2): 1 via ORAL
  Filled 2016-03-23 (×2): qty 1

## 2016-03-23 NOTE — Discharge Instructions (Signed)

## 2016-03-23 NOTE — Evaluation (Signed)
Physical Therapy Evaluation Patient Details Name: Nicholas Patel MRN: [id number] DOB: 16-Nov-1930 Today's Date: 03/23/2016   History of Present Illness  Pt is a pleasant 83 yo male s/p L TKR  Clinical Impression  Pt awake, alert and demonstrated good communication and safety awareness throughout PT eval. Stated he was not feeling any pain and received pain meds prior to session. Pt demonstrated good overall strength with some deficits in L LE grossly 4/5 for strength, pt able to actively perform SLR and does not require knee immobilizer at this time. L knee AAROM was 3-93. Pt able to move from supine to sitting w/ min guarding of L LE and cuing for using bed rails. He transferred to standing w/ RW w/ min guarding for safety, no sings of knee buckling or LOB while using Rw. He ambulated outside his room and back (30') w/ RW and CGA. He presented w/ a forward flexed, step to gait pattern, and required increase time and distance for turning w/ Rw, no signs of knee buckling or LOB w/ ambulation. Pt currently displays decreased L knee strength and ROM, and balance deficits that limit functional mobility, he will benefit from skilled PT to address above deficits. Currently recommend HHPT following acute hospitalization.      Follow Up Recommendations Home health PT    Equipment Recommendations  Rolling walker with 5" wheels    Recommendations for Other Services OT consult     Precautions / Restrictions Precautions Precautions: Fall Restrictions Weight Bearing Restrictions: Yes LUE Weight Bearing: Weight bearing as tolerated      Mobility  Bed Mobility Overal bed mobility: Needs Assistance Bed Mobility: Supine to Sit     Supine to sit: Min guard     General bed mobility comments: cuing and guiding of L LE, pt able to use bedrails to assist to upright position  Transfers Overall transfer level: Needs assistance Equipment used: Rolling walker (2 wheeled) Transfers: Sit to/from  Stand Sit to Stand: Min guard         General transfer comment: cuing for hand placement, L LE positioned anterior to R LE, no buckling or increase in pain  Ambulation/Gait Ambulation/Gait assistance: Min guard Ambulation Distance (Feet): 30 Feet Assistive device: Rolling walker (2 wheeled) Gait Pattern/deviations: Step-to pattern;Decreased step length - right;Decreased stance time - left;Trunk flexed   Gait velocity interpretation: Below normal speed for age/gender General Gait Details: cuing for standing inside RW and safe use of DME, no buckling or LOB throughout ambulation, remained forward flexed throughout ambulation, no increase in pain  Stairs            Wheelchair Mobility    Modified Rankin (Stroke Patients Only)       Balance Overall balance assessment: Needs assistance Sitting-balance support: Feet supported Sitting balance-Leahy Scale: Good Sitting balance - Comments: able to maintain upright posture under PT supervision, slightly forwardly flexed    Standing balance support: Bilateral upper extremity supported Standing balance-Leahy Scale: Fair Standing balance comment: RW required for additional stability, forward flexed posture able to correct w/ cuing                             Pertinent Vitals/Pain Pain Assessment: No/denies pain    Home Living Family/patient expects to be discharged to:: Private residence Living Arrangements: Alone Available Help at Discharge:  (no help at dc) Type of Home: House Home Access: Level entry     Home Layout: One level  Home Equipment: Kasandra Knudsen - single point;Walker - 4 wheels;Tub bench;Toilet riser      Prior Function Level of Independence: Independent with assistive device(s)         Comments: able to take care of self w/ AD, stated he normally feels unsteady on his feet and unable to stand for more than 45 seconds     Hand Dominance   Dominant Hand: Right    Extremity/Trunk Assessment    Upper Extremity Assessment Upper Extremity Assessment: Overall WFL for tasks assessed    Lower Extremity Assessment Lower Extremity Assessment: LLE deficits/detail LLE Deficits / Details: grossly at least 4/5, decreased ROM       Communication   Communication: No difficulties  Cognition Arousal/Alertness: Awake/alert Behavior During Therapy: WFL for tasks assessed/performed Overall Cognitive Status: Within Functional Limits for tasks assessed                      General Comments      Exercises Total Joint Exercises Goniometric ROM: 3-93 active assist ROM for L knee Other Exercises Other Exercises: Supine therex; 1x15; AROM to improve strength for functional tasks; ankle pumps, quad sets, heel slides, SLR, hip abd, able to perform w/ verbal cuing for correct technique   Assessment/Plan    PT Assessment Patient needs continued PT services  PT Problem List Decreased strength;Decreased range of motion;Decreased balance;Decreased mobility;Decreased knowledge of use of DME          PT Treatment Interventions Gait training;Functional mobility training;Balance training;Therapeutic exercise;Therapeutic activities;DME instruction;Patient/family education    PT Goals (Current goals can be found in the Care Plan section)  Acute Rehab PT Goals Patient Stated Goal: to improve stability in stance PT Goal Formulation: With patient Time For Goal Achievement: 04/06/16 Potential to Achieve Goals: Good    Frequency BID   Barriers to discharge Decreased caregiver support Pt lives alone w/o any support    Co-evaluation               End of Session Equipment Utilized During Treatment: Gait belt Activity Tolerance: Patient tolerated treatment well Patient left: in chair;with call bell/phone within reach;with chair alarm set;with nursing/sitter in room;with SCD's reapplied;Other (comment) (polar care attached) Nurse Communication: Mobility status         Time:  [id number] PT Time Calculation (min) (ACUTE ONLY): 34 min   Charges:         PT G Codes:        Jumana Paccione Student PT 03/23/2016, 10:05 AM

## 2016-03-23 NOTE — NC FL2 (Signed)
[place] LEVEL OF CARE SCREENING TOOL     IDENTIFICATION  Patient Name: Nicholas Patel Birthdate: Jun 10, 1930 Sex: male Admission Date (Current Location): 03/22/2016  [facility] and [place] Number:  Engineering geologist and Address:  [facility], [address] [place], [place] [address]      Provider Number: [id number]  Attending Physician Name and Address:  Dereck Leep, MD  Relative Name and Phone Number:       Current Level of Care: Hospital Recommended Level of Care: [place] Prior Approval Number:    Date Approved/Denied:   PASRR Number:    Discharge Plan: SNF    Current Diagnoses: Patient Active Problem List   Diagnosis Date Noted  . S/P total knee arthroplasty 03/22/2016  . Persistent atrial fibrillation ([place]) 02/17/2016  . Paroxysmal atrial fibrillation ([place]) 12/01/2015  . Pure hypercholesterolemia 12/01/2015  . Type 2 diabetes mellitus without complication, without long-term current use of insulin ([place]) 12/01/2015  . Prostate CA ([place]) 08/27/2015  . Borderline diabetes 05/21/2015  . Hyperlipidemia 05/21/2015  . PAF (paroxysmal atrial fibrillation) ([place]) 05/21/2015  . Chronic a-fib ([facility]) 08/30/2014    Orientation RESPIRATION BLADDER Height & Weight     Self, Time, Situation, Place  Normal External catheter Weight: 168 lb (76.2 kg) Height:  5\' 7"  (170.2 cm)  BEHAVIORAL SYMPTOMS/MOOD NEUROLOGICAL BOWEL NUTRITION STATUS   (None. )  (None. ) Incontinent Diet (Diet: Carb Modified)  AMBULATORY STATUS COMMUNICATION OF NEEDS Skin   Extensive Assist Verbally Surgical wounds (Incision: Left Knee)                       Personal Care Assistance Level of Assistance  Bathing, Feeding, Dressing Bathing Assistance: Limited assistance Feeding assistance: Independent Dressing Assistance: Limited assistance     Functional Limitations Info  Sight, Hearing, Speech Sight Info: Adequate Hearing Info:  Adequate Speech Info: Adequate    SPECIAL CARE FACTORS FREQUENCY  PT (By licensed PT), OT (By licensed OT)     PT Frequency:  (5) OT Frequency:  (5)            Contractures      Additional Factors Info  Code Status, Allergies Code Status Info:  (Full Code) Allergies Info:  ( Other, Salmon Fish Allergy)           Current Medications (03/23/2016):  This is the current hospital active medication list Current Facility-Administered Medications  Medication Dose Route Frequency Provider Last Rate Last Dose  . 0.9 %  sodium chloride infusion   Intravenous Continuous Dereck Leep, MD 100 mL/hr at 03/23/16 0605    . acetaminophen (OFIRMEV) IV 1,000 mg  1,000 mg Intravenous Q6H Dereck Leep, MD   1,000 mg at 03/23/16 [id number]  . acetaminophen (TYLENOL) tablet 650 mg  650 mg Oral Q6H PRN Dereck Leep, MD       Or  . acetaminophen (TYLENOL) suppository 650 mg  650 mg Rectal Q6H PRN Dereck Leep, MD      . alfuzosin (UROXATRAL) 24 hr tablet 10 mg  10 mg Oral Q breakfast Dereck Leep, MD      . alum & mag hydroxide-simeth (MAALOX/MYLANTA) 200-200-20 MG/5ML suspension 30 mL  30 mL Oral Q4H PRN Dereck Leep, MD      . atorvastatin (LIPITOR) tablet 40 mg  40 mg Oral q1800 Dereck Leep, MD   40 mg at 03/22/16 1846  . bisacodyl (DULCOLAX) suppository  10 mg  10 mg Rectal Daily PRN Dereck Leep, MD      . ceFAZolin (ANCEF) IVPB 2 g/50 mL premix  2 g Intravenous Q6H Dereck Leep, MD   2 g at 03/23/16 0600  . celecoxib (CELEBREX) capsule 200 mg  200 mg Oral Q12H Dereck Leep, MD   200 mg at 03/22/16 2048  . diphenhydrAMINE (BENADRYL) 12.5 MG/5ML elixir 12.5-25 mg  12.5-25 mg Oral Q4H PRN Dereck Leep, MD      . ferrous sulfate tablet 325 mg  325 mg Oral BID WC Dereck Leep, MD   325 mg at 03/23/16 0824  . insulin aspart (novoLOG) injection 0-15 Units  0-15 Units Subcutaneous TID WC Dereck Leep, MD   3 Units at 03/23/16 [phone]  . magnesium hydroxide (MILK OF MAGNESIA) suspension  30 mL  30 mL Oral Daily PRN Dereck Leep, MD      . menthol-cetylpyridinium (CEPACOL) lozenge 3 mg  1 lozenge Oral PRN Dereck Leep, MD       Or  . phenol (CHLORASEPTIC) mouth spray 1 spray  1 spray Mouth/Throat PRN Dereck Leep, MD      . metFORMIN (GLUCOPHAGE) tablet 500 mg  500 mg Oral Q breakfast Dereck Leep, MD   500 mg at 03/23/16 0824  . metoCLOPramide (REGLAN) tablet 10 mg  10 mg Oral TID AC & HS Dereck Leep, MD   10 mg at 03/23/16 0824  . metoprolol succinate (TOPROL-XL) 24 hr tablet 25 mg  25 mg Oral Daily Dereck Leep, MD      . montelukast (SINGULAIR) tablet 10 mg  10 mg Oral QHS Dereck Leep, MD   10 mg at 03/22/16 2048  . morphine 2 MG/ML injection 2 mg  2 mg Intravenous Q2H PRN Dereck Leep, MD      . ondansetron (ZOFRAN) tablet 4 mg  4 mg Oral Q6H PRN Dereck Leep, MD       Or  . ondansetron (ZOFRAN) injection 4 mg  4 mg Intravenous Q6H PRN Dereck Leep, MD      . oxyCODONE (Oxy IR/ROXICODONE) immediate release tablet 5-10 mg  5-10 mg Oral Q4H PRN Dereck Leep, MD   5 mg at 03/22/16 2047  . pantoprazole (PROTONIX) EC tablet 40 mg  40 mg Oral BID Dereck Leep, MD   40 mg at 03/22/16 2048  . psyllium (HYDROCIL/METAMUCIL) packet 1 packet  1 packet Oral Daily Watt Climes, PA      . rivaroxaban (XARELTO) tablet 20 mg  20 mg Oral Daily Dereck Leep, MD      . senna-docusate (Senokot-S) tablet 1 tablet  1 tablet Oral BID Dereck Leep, MD   1 tablet at 03/22/16 2048  . sodium phosphate (FLEET) 7-19 GM/118ML enema 1 enema  1 enema Rectal Once PRN Dereck Leep, MD      . traMADol Veatrice Bourbon) tablet 50-100 mg  50-100 mg Oral Q4H PRN Dereck Leep, MD   100 mg at 03/23/16 [id number]     Discharge Medications: Please see discharge summary for a list of discharge medications.  Relevant Imaging Results:  Relevant Lab Results:   Additional Information  (SSN: [id number])  Danie Chandler, Student-Social Work

## 2016-03-23 NOTE — Care Management Note (Addendum)
Case Management Note  Patient Details  Name: Nicholas Patel MRN: [id number] Date of Birth: 1930/11/19  Subjective/Objective:  POD # 1  Left TKA. Met with patient at bedside to discuss discharge planning. Patient lives at home alone. His son stays with him at night after 6 pm. He uses a cane but has a walker. Offered choice of home health agencies. Referral to [facility] for HHPT. He is on Xarelto so no need for Lovenox. PCP is Glendon Axe.               Correct address: [place], [place] [address] [address] [phone]   Action/Plan:   Expected Discharge Date:                  Expected Discharge Plan:  [place]  In-House Referral:     Discharge planning Services  CM Consult  Post Acute Care Choice:  Home Health Choice offered to:  Patient  DME Arranged:    DME Agency:     HH Arranged:  PT [place]:  [facility] (now [facility])  Status of Service:  In process, will continue to follow  If discussed at Long Length of Stay Meetings, dates discussed:    Additional Comments:  Jolly Mango, RN 03/23/2016, 11:53 AM

## 2016-03-23 NOTE — Progress Notes (Signed)
   Subjective: 1 Day Post-Op Procedure(s) (LRB): COMPUTER ASSISTED TOTAL KNEE ARTHROPLASTY (Left) Patient reports pain as 0 on 0-10 scale.   Patient is well, and has had no acute complaints or problems We will start therapy today.  Plan is to go Rehab after hospital stay. no nausea and no vomiting Patient denies any chest pains or shortness of breath. Objective: Vital signs in last 24 hours: Temp:  [97.6 F (36.4 C)-98.8 F (37.1 C)] 98.1 F (36.7 C) (02/13 0720) Pulse Rate:  [41-107] 41 (02/13 0720) Resp:  [10-21] 17 (02/13 0720) BP: (106-132)/(60-104) 110/60 (02/13 0720) SpO2:  [91 %-100 %] 100 % (02/13 0720) Weight:  [76.2 kg (168 lb)] 76.2 kg (168 lb) (02/12 1004) Heels are non tender and elevated off the bed using rolled towels along with bone foam under left heel  Intake/Output from previous day: 02/12 0701 - 02/13 0700 In: 2430 [P.O.:220; I.V.:2110; IV Piggyback:100] Out: 1495 [Urine:925; Drains:470; Blood:100] Intake/Output this shift: No intake/output data recorded.   Recent Labs  03/23/16 0351  HGB 9.7*    Recent Labs  03/22/16 1011 03/23/16 0351  WBC  --  8.7  RBC  --  3.02*  HCT  --  28.6*  PLT 90* 78*    Recent Labs  03/23/16 0351  NA 132*  K 4.3  CL 105  CO2 20*  BUN 22*  CREATININE 0.92  GLUCOSE 164*  CALCIUM 7.8*   No results for input(s): LABPT, INR in the last 72 hours.  EXAM General - Patient is Alert, Appropriate and Oriented Extremity - Neurologically intact Neurovascular intact Sensation intact distally Intact pulses distally Dorsiflexion/Plantar flexion intact Compartment soft Dressing - dressing C/D/I Motor Function - intact, moving foot and toes well on exam. Able to do straight leg raise on his own  Past Medical History:  Diagnosis Date  . Atrial fibrillation ([place])   . Broken neck ([place])   . Chicken pox   . Diabetes mellitus without complication ([place])   . Diverticulosis   . Heart murmur    as child  .  Hyperlipidemia   . Hypertension   . Prostate cancer ([place])   . Prostate cancer ([place])   . Skin cancer   . Sleep apnea   . TIA (transient ischemic attack) 2009    Assessment/Plan: 1 Day Post-Op Procedure(s) (LRB): COMPUTER ASSISTED TOTAL KNEE ARTHROPLASTY (Left) Active Problems:   S/P total knee arthroplasty  Estimated body mass index is 26.31 kg/m as calculated from the following:   Height as of this encounter: 5\' 7"  (1.702 m).   Weight as of this encounter: 76.2 kg (168 lb). Advance diet Up with therapy D/C IV fluids Plan for discharge tomorrow Discharge to SNF  Labs: Were reviewed. Platelets 78 down from 90. Hemoglobin 9.7 DVT Prophylaxis - Xarelto Weight-Bearing as tolerated to left leg D/C O2 and Pulse OX and try on Room Air Begin working on bowel movement Labs tomorrow morning  Maritta Kief R. [place] Burnham 03/23/2016, 7:26 AM

## 2016-03-23 NOTE — Discharge Summary (Signed)
Physician Discharge Summary  Patient ID: Nicholas Patel MRN: [id number] DOB/AGE: [birth year] 83 y.o.  Admit date: 03/22/2016 Discharge date: 03/24/2016  Admission Diagnoses:  PRIMARY OSTEOARTHRITIS OF LEFT KNEE   Discharge Diagnoses: Patient Active Problem List   Diagnosis Date Noted  . S/P total knee arthroplasty 03/22/2016  . Persistent atrial fibrillation ([place]) 02/17/2016  . Paroxysmal atrial fibrillation ([place]) 12/01/2015  . Pure hypercholesterolemia 12/01/2015  . Type 2 diabetes mellitus without complication, without long-term current use of insulin ([place]) 12/01/2015  . Prostate CA ([place]) 08/27/2015  . Borderline diabetes 05/21/2015  . Hyperlipidemia 05/21/2015  . PAF (paroxysmal atrial fibrillation) ([place]) 05/21/2015  . Chronic a-fib ([place]) 08/30/2014    Past Medical History:  Diagnosis Date  . Atrial fibrillation ([place])   . Broken neck ([place])   . Chicken pox   . Diabetes mellitus without complication ([place])   . Diverticulosis   . Heart murmur    as child  . Hyperlipidemia   . Hypertension   . Prostate cancer ([place])   . Prostate cancer ([place])   . Skin cancer   . Sleep apnea   . TIA (transient ischemic attack) 2009     Transfusion: No transfusions given doing this admission   Consultants (if any):  case management for placement  Discharged Condition: Improved  Hospital Course: Nicholas Patel is an 83 y.o. male who was admitted 03/22/2016 with a diagnosis of degenerative arthrosis left knee and went to the operating room on 03/22/2016 and underwent the above named procedures.    Surgeries:Procedure(s): COMPUTER ASSISTED TOTAL KNEE ARTHROPLASTY on 03/22/2016  PRE-OPERATIVE DIAGNOSIS: Degenerative arthrosis of the left knee, primary  POST-OPERATIVE DIAGNOSIS:  Same  PROCEDURE:  Left total knee arthroplasty using computer-assisted navigation  SURGEON:  Marciano Sequin. M.D.  ASSISTANT:  Vance Peper, PA (present and scrubbed throughout the case, critical for  assistance with exposure, retraction, instrumentation, and closure)  ANESTHESIA: general  ESTIMATED BLOOD LOSS: 100 mL  FLUIDS REPLACED: 1250 mL of crystalloid  TOURNIQUET TIME: 88 minutes  DRAINS: 2 medium drains to a reinfusion system  SOFT TISSUE RELEASES: Anterior cruciate ligament, posterior cruciate ligament, deep medial collateral ligament, patellofemoral ligament  IMPLANTS UTILIZED: DePuy Attune size 7 posterior stabilized femoral component (cemented), size 8 rotating platform tibial component (cemented), 41 mm medialized dome patella (cemented), and a 5 mm stabilized rotating platform polyethylene insert.  INDICATIONS FOR SURGERY: Nicholas Patel is a 83 y.o. year old male with a long history of progressive knee pain. X-rays demonstrated severe degenerative changes in tricompartmental fashion. The patient had not seen any significant improvement despite conservative nonsurgical intervention. After discussion of the risks and benefits of surgical intervention, the patient expressed understanding of the risks benefits and agree with plans for total knee arthroplasty.   The risks, benefits, and alternatives were discussed at length including but not limited to the risks of infection, bleeding, nerve injury, stiffness, blood clots, the need for revision surgery, cardiopulmonary complications, among others, and they were willing to proceed.  Patient tolerated the surgery well. No complications .Patient was taken to PACU where she was stabilized and then transferred to the orthopedic floor.  Patient started on Xarelto  . Foot pumps applied bilaterally at 80 mm hgb. Heels elevated off bed with rolled towels. No evidence of DVT. Calves non tender. Negative Homan. Physical therapy started on day #1 for gait training and transfer with OT starting on  day #1 for ADL and assisted devices. Patient has done well with therapy.  Ambulated greater than 200 feet upon being  discharged.  Patient's IV and Foley were discontinued on day #1 with Hemovac being discontinued on day #2 along with dressing change prior to being discharged   He was given perioperative antibiotics:  Anti-infectives    Start     Dose/Rate Route Frequency Ordered Stop   03/22/16 1900  ceFAZolin (ANCEF) IVPB 2g/100 mL premix  Status:  Discontinued     2 g 200 mL/hr over 30 Minutes Intravenous Every 6 hours 03/22/16 1744 03/22/16 1810   03/22/16 1900  ceFAZolin (ANCEF) IVPB 2 g/50 mL premix     2 g 100 mL/hr over 30 Minutes Intravenous Every 6 hours 03/22/16 1810 03/23/16 1859   03/22/16 0805  ceFAZolin (ANCEF) 2-4 GM/100ML-% IVPB    Comments:  LEWIS, CINDY: cabinet override      03/22/16 0805 03/22/16 2014   03/22/16 0600  ceFAZolin (ANCEF) IVPB 2g/100 mL premix  Status:  Discontinued     2 g 200 mL/hr over 30 Minutes Intravenous On call to O.R. 03/21/16 2202 03/22/16 [id number]    .  He was fitted with AV 1 compression foot pump devices, instructed on heel pumps, early ambulation, and TED stockings bilaterally for DVT prophylaxis.  He benefited maximally from the hospital stay and there were no complications.    Recent vital signs:  Vitals:   03/23/16 0434 03/23/16 0720  BP: 121/72 110/60  Pulse: 76 (!) 41  Resp: 18 17  Temp: 98 F (36.7 C) 98.1 F (36.7 C)    Recent laboratory studies:  Lab Results  Component Value Date   HGB 9.7 (L) 03/23/2016   HGB 12.6 (L) 03/10/2016   Lab Results  Component Value Date   WBC 8.7 03/23/2016   PLT 78 (L) 03/23/2016   Lab Results  Component Value Date   INR 1.12 03/10/2016   Lab Results  Component Value Date   NA 132 (L) 03/23/2016   K 4.3 03/23/2016   CL 105 03/23/2016   CO2 20 (L) 03/23/2016   BUN 22 (H) 03/23/2016   CREATININE 0.92 03/23/2016   GLUCOSE 164 (H) 03/23/2016    Discharge Medications:   Allergies as of 03/24/2016      Reactions   Other    Other reaction(s): Other (See Comments) Dust/cats - sneezing    [place] [fish Allergy] Nausea And Vomiting      Medication List    TAKE these medications   alfuzosin 10 MG 24 hr tablet Commonly known as:  UROXATRAL Take 10 mg by mouth daily with breakfast.   atorvastatin 40 MG tablet Commonly known as:  LIPITOR Take 40 mg by mouth daily at 6 PM.   metFORMIN 500 MG tablet Commonly known as:  GLUCOPHAGE Take 500 mg by mouth daily.   montelukast 10 MG tablet Commonly known as:  SINGULAIR Take 10 mg by mouth at bedtime.   oxyCODONE 5 MG immediate release tablet Commonly known as:  Oxy IR/ROXICODONE Take 1-2 tablets (5-10 mg total) by mouth every 4 (four) hours as needed for severe pain or breakthrough pain.   rivaroxaban 20 MG Tabs tablet Commonly known as:  XARELTO Take one tablet (20 mg) by mouth once daily   TOPROL XL 25 MG 24 hr tablet Generic drug:  metoprolol succinate Take 25 mg by mouth daily.   traMADol 50 MG tablet Commonly known as:  ULTRAM Take 1-2 tablets (50-100 mg total) by mouth every 4 (four) hours as needed for moderate pain.  Durable Medical Equipment        Start     Ordered   03/22/16 1745  DME Walker rolling  Once    Question:  Patient needs a walker to treat with the following condition  Answer:  Total knee replacement status   03/22/16 1744   03/22/16 1745  DME Bedside commode  Once    Question:  Patient needs a bedside commode to treat with the following condition  Answer:  Total knee replacement status   03/22/16 1744      Diagnostic Studies: Dg Knee Left Port  Result Date: 03/22/2016 CLINICAL DATA:  Status post left total knee replacement. Initial encounter. EXAM: PORTABLE LEFT KNEE - 1-2 VIEW COMPARISON:  None. FINDINGS: The patient's total knee arthroplasty is grossly unremarkable in appearance, without evidence of loosening or new fracture. Overlying drainage catheters are seen, with associated skin staples and scattered postoperative soft tissue air. Scattered vascular calcifications  are seen. IMPRESSION: Status post total knee arthroplasty, without evidence of loosening or new fracture. Electronically Signed   By: Garald Balding M.D.   On: 03/22/2016 16:52    Disposition: 01-Home or Self Care    Follow-up Information    WOLFE,JON R., PA On 04/06/2016.   Specialty:  Physician Assistant Why:  at 2:15pm Contact information: [place] [address] [phone]        Dereck Leep, MD On 05/04/2016.   Specialty:  Orthopedic Surgery Why:  at 1:45pm Contact information: [place] [address] [phone]            Signed: Watt Climes 03/23/2016, 7:32 AM

## 2016-03-23 NOTE — Progress Notes (Signed)
Alert and oriented. Tolerated sitting on side of be without complaint of pain or discomfort. Dsg to left knee dry and intact with polar care in place. Encouraged to use incentive spirometer. Bone foam in place. VSS. Medicated for pain x1 during the night. Staff will continue to follow

## 2016-03-23 NOTE — Clinical Social Work Placement (Deleted)
   CLINICAL SOCIAL WORK PLACEMENT  NOTE  Date:  03/23/2016  Patient Details  Name: Nicholas Patel MRN: [id number] Date of Birth: [birth year]  Clinical Social Work is seeking post-discharge placement for this patient at the [place] level of care (*CSW will initial, date and re-position this form in  chart as items are completed):  Yes   Patient/family provided with [facility] Work Department's list of facilities offering this level of care within the geographic area requested by the patient (or if unable, by the patient's family).  Yes   Patient/family informed of their freedom to choose among providers that offer the needed level of care, that participate in Medicare, Medicaid or managed care program needed by the patient, have an available bed and are willing to accept the patient.  Yes   Patient/family informed of [facility]'s ownership interest in [facility] and [facility], as well as of the fact that they are under no obligation to receive care at these facilities.  PASRR submitted to EDS on 03/23/16     PASRR number received on       Existing PASRR number confirmed on       FL2 transmitted to all facilities in geographic area requested by pt/family on 03/23/16     FL2 transmitted to all facilities within larger geographic area on       Patient informed that his/her managed care company has contracts with or will negotiate with certain facilities, including the following:            Patient/family informed of bed offers received.  Patient chooses bed at       Physician recommends and patient chooses bed at      Patient to be transferred to   on  .  Patient to be transferred to facility by       Patient family notified on   of transfer.  Name of family member notified:        PHYSICIAN       Additional Comment:    _______________________________________________ Danie Chandler, [facility] 03/23/2016, 8:48  AM

## 2016-03-23 NOTE — Evaluation (Signed)
Occupational Therapy Evaluation Patient Details Name: KAESEN SHELLENBARGER MRN: [id number] DOB: 1931/01/05 Today's Date: 03/23/2016    History of Present Illness Pt. is an 83 y.o. male who was admitted to [facility] for a Left TKR. pt. PMHx includes: Colon surgery, hernia repair, Pacemaker, Left shoulder Arthroscopy with rotator cuff repair, DM, Prostate CA, skin CA, and TIA.   Clinical Impression   Pt. Is an 83 y.o. male who was admitted for a left TKR. Pt presents with limited ROM, pain, weakness, and impaired functional mobility which hinder his ability to complete ADL and IADL tasks. Pt. could benefit from skilled OT services to review A/E use for LE ADLs, to review necessary home modifications, and to improve functional mobility for ADL/IADLs in order to work towards regaining Independence with ADL/IADLs. Pt. Is planning to go to SNF upon discharge. Pt. Could benefit from follow-up OT services , as pt. will require assist with basic self-care, and IADL functioning.    Follow Up Recommendations  SNF    Equipment Recommendations       Recommendations for Other Services       Precautions / Restrictions Precautions Precautions: Fall Restrictions Weight Bearing Restrictions: Yes LUE Weight Bearing: Weight bearing as tolerated      Mobility Bed Mobility Overal bed mobility: Needs Assistance Bed Mobility: Supine to Sit     Supine to sit: Min guard            General transfer comment: cuing for hand placement, L LE positioned anterior to R LE, no buckling or increase in pain    Balance  Sitting-balance support: Feet supported Sitting balance-Leahy Scale: Good                             ADL Overall ADL's : Needs assistance/impaired Eating/Feeding:  (Pt. has difficulty opening items, and providing set-up secondary to arthritis in bilateral hands.)   Grooming: Set up               Lower Body Dressing: Moderate assistance                  General ADL Comments: Pt. education was provided about A/E use for LE ADLs. Reviewed anticipated ADL and IADL needs.     Vision     Perception     Praxis      Pertinent Vitals/Pain Pain Assessment: 0-10 Pain Score: 1  Pain Location: Left knee Pain Descriptors / Indicators: Aching     Hand Dominance Right   Extremity/Trunk Assessment Upper Extremity Assessment Upper Extremity Assessment: LUE deficits/detail;Generalized weakness LUE Deficits / Details: Left shoulder ROM limitations from previous shoulder surgery. Pt. Has arthritis in bilateral hands.         Communication Communication Communication: HOH (Has hearing aides)   Cognition Arousal/Alertness: Awake/alert Behavior During Therapy: WFL for tasks assessed/performed Overall Cognitive Status: Within Functional Limits for tasks assessed                     General Comments       Exercises     Shoulder Instructions      Home Living Family/patient expects to be discharged to:: Private residence Living Arrangements: Alone;Children (Resides with son. Son is out of the house 6am to 6pm, and is present on weekends, Daughter-in-law is out of town caring for an ailing family member.) Available Help at Discharge:  (no help at dc) Type of Home: House Home Access:  Level entry (Entry level through the front, 1 step through the garage)     Home Layout: One level     Bathroom Shower/Tub: Tub/shower unit     Bathroom Accessibility: Yes   Home Equipment: Cane - single point;Walker - 4 wheels;Shower seat          Prior Functioning/Environment Level of Independence: Independent with assistive device(s)    ADL's / Homemaking Assistance Needed: Within the past year 1-2 years pt. has moved from [place] to [place] to buy a home for his wife, and arrange caregivers for her there. He has moved to to [place] to live with his son, while his wife still resides in [place] with his daughter. Pt. was independent with light  meal prep for breakfast and lunch, son assist with dinner, Pt. reports eating out frequently. Son assist with medication management, and pillbox prep.   Comments: able to take care of self w/ AD, stated he normally feels unsteady on his feet and unable to stand for more than 45 seconds        OT Problem List: Decreased strength;Decreased knowledge of use of DME or AE;Decreased activity tolerance;Pain   OT Treatment/Interventions: Self-care/ADL training;Therapeutic exercise;Patient/family education;Therapeutic activities;DME and/or AE instruction    OT Goals(Current goals can be found in the care plan section) Acute Rehab OT Goals Patient Stated Goal: To regain independence. OT Goal Formulation: With patient Potential to Achieve Goals: Good  OT Frequency: Min 1X/week   Barriers to D/C:            Co-evaluation              End of Session    Activity Tolerance: Patient tolerated treatment well Patient left: in bed, with call bell near, and alarm in place.   Time: [id number] OT Time Calculation (min): 48 min Charges:  OT General Charges $OT Visit: 1 Procedure OT Evaluation $OT Eval Moderate Complexity: 1 Procedure G-Codes:    Harrel Carina, MS, OTR/L 03/23/2016, 11:48 AM

## 2016-03-23 NOTE — Progress Notes (Signed)
Clinical Social Worker (CSW) received SNF consult. PT is recommending home health. RN case manager aware of above. Please reconsult if future social work needs arise. CSW signing off.   Markail Diekman, LCSW [phone] 

## 2016-03-23 NOTE — Progress Notes (Signed)
Physical Therapy Treatment Patient Details Name: Nicholas Patel MRN: [id number] DOB: 1931/02/02 Today's Date: 03/23/2016    History of Present Illness Pt. is an 83 y.o. male s/p L TKR, PMH of cervical fracture    PT Comments    Pt awake, alert and willing to participate in PT session. He denied any pain in his L knee and stated he was feeling good. Pt is progressing in mobility and able to move to sitting from supine w/ modified independence and w/ the HOB elevated. Performed transfer training w/ RW and min guarding from a variety of surfaces to simulate home environment; including the bed, recliner, chair, and toilet; he tolerated all transfers well and displayed good stability and safety w/ RW. Pt ambulated around nursing station (160') w/ RW and min guarding. He required cuing for safe use of RW at the beginning of ambulation and was able to ambulate w/ a step to gait pattern. His trunk remained flexed while walking, but stated this was secondary to his history of cervical fractures. Overall patient is progressing in mobility. He still displays strength, ROM and balance deficits that will benefit from continued PT services. Recommendation for HHPT following hospital stay remains the same.   Follow Up Recommendations  Home health PT     Equipment Recommendations  Rolling walker with 5" wheels    Recommendations for Other Services       Precautions / Restrictions Precautions Precautions: Fall Restrictions Weight Bearing Restrictions: Yes LUE Weight Bearing: Weight bearing as tolerated    Mobility  Bed Mobility Overal bed mobility: Modified Independent Bed Mobility: Supine to Sit     Supine to sit: Modified independent (Device/Increase time)     General bed mobility comments: use of bed rails and required increase time to advance surgical LE over EOB  Transfers Overall transfer level: Needs assistance Equipment used: Rolling walker (2 wheeled) Transfers: Sit to/from  Stand Sit to Stand: Supervision         General transfer comment: cuing for hand placement, BUE for push off into standing,   Ambulation/Gait Ambulation/Gait assistance: Min guard Ambulation Distance (Feet): 160 Feet Assistive device: Rolling walker (2 wheeled) Gait Pattern/deviations: Step-through pattern;Trunk flexed;Drifts right/left     General Gait Details: able to ambulate around nursing station w/ no buckling or LOB, slight forward flexed posture but stated posutre is same at baseline; cuing at beginning for safe use of RW and able to self correct drifting to R or L   Stairs            Wheelchair Mobility    Modified Rankin (Stroke Patients Only)       Balance Overall balance assessment: Needs assistance Sitting-balance support: Feet supported Sitting balance-Leahy Scale: Good Sitting balance - Comments: able to maintain upright posture under PT supervision, slightly forward flexed posture   Standing balance support: Bilateral upper extremity supported Standing balance-Leahy Scale: Fair Standing balance comment: attempted reaching activities stated he felt less stable reaching too far forward or downward, RW required for additional stability                    Cognition Arousal/Alertness: Awake/alert Behavior During Therapy: WFL for tasks assessed/performed Overall Cognitive Status: Within Functional Limits for tasks assessed                      Exercises Other Exercises Other Exercises: Transfer training from multiple surfaces including; bed, chari, recliner, and toilet to improve household mobility and independence;  used RW and min guarding, 1x6 times Other Exercises: Standing therex; AROM; 1x15; to improve balance and strength for functional tasks; use RW for additional stability; marches, heel raises, reaching exercises    General Comments        Pertinent Vitals/Pain Pain Assessment: No/denies pain    Home Living                       Prior Function            PT Goals (current goals can now be found in the care plan section) Acute Rehab PT Goals Patient Stated Goal: To return home safely PT Goal Formulation: With patient Time For Goal Achievement: 04/06/16 Potential to Achieve Goals: Good Progress towards PT goals: Progressing toward goals    Frequency    BID      PT Plan Current plan remains appropriate    Co-evaluation             End of Session Equipment Utilized During Treatment: Gait belt Activity Tolerance: Patient tolerated treatment well Patient left: in chair;with chair alarm set;with call bell/phone within reach;Other (comment) (towel roll under foot and polar care applied)     Time: [id number] PT Time Calculation (min) (ACUTE ONLY): 32 min  Charges:                       G Codes:      [employer] Student PT 03/23/2016, 4:19 PM

## 2016-03-24 LAB — CBC
HCT: 25.1 % — ABNORMAL LOW (ref 40.0–52.0)
HEMOGLOBIN: 8.6 g/dL — AB (ref 13.0–18.0)
MCH: 32.2 pg (ref 26.0–34.0)
MCHC: 34.1 g/dL (ref 32.0–36.0)
MCV: 94.5 fL (ref 80.0–100.0)
Platelets: 67 10*3/uL — ABNORMAL LOW (ref 150–440)
RBC: 2.65 MIL/uL — ABNORMAL LOW (ref 4.40–5.90)
RDW: 19.6 % — ABNORMAL HIGH (ref 11.5–14.5)
WBC: 8.6 10*3/uL (ref 3.8–10.6)

## 2016-03-24 LAB — BASIC METABOLIC PANEL
Anion gap: 5 (ref 5–15)
BUN: 21 mg/dL — AB (ref 6–20)
CHLORIDE: 110 mmol/L (ref 101–111)
CO2: 22 mmol/L (ref 22–32)
CREATININE: 0.9 mg/dL (ref 0.61–1.24)
Calcium: 8.1 mg/dL — ABNORMAL LOW (ref 8.9–10.3)
GFR calc Af Amer: >60 mL/min (ref >60)
GFR calc non Af Amer: >60 mL/min (ref >60)
GLUCOSE: 99 mg/dL (ref 65–99)
Potassium: 4.5 mmol/L (ref 3.5–5.1)
SODIUM: 137 mmol/L (ref 135–145)

## 2016-03-24 LAB — GLUCOSE, CAPILLARY: GLUCOSE-CAPILLARY: 95 mg/dL (ref 65–99)

## 2016-03-24 MED ORDER — OXYCODONE HCL 5 MG PO TABS: 5.0000 mg | ORAL_TABLET | ORAL | 0 refills | Status: AC | PRN

## 2016-03-24 MED ORDER — TRAMADOL HCL 50 MG PO TABS: 50.0000 mg | ORAL_TABLET | ORAL | 0 refills | Status: AC | PRN

## 2016-03-24 NOTE — Progress Notes (Signed)
Physical Therapy Treatment Patient Details Name: DWANYE TIPPENS MRN: [id number] DOB: 1930-04-04 Today's Date: 03/24/2016    History of Present Illness Pt. is a pleasant 83 y.o. male s/p L TKR, PMH of cervical fracture    PT Comments    Pt in pleasant mood this morning and denies pain, willing to participate in PT treatment. Pt displayed improved strength and mobility this session and able to safely transfer OOB with modified independence. ROM for L knee is still limited but is improving; currently flex/ext is 1-95 AAROM. He ambulated with improved gait speed this morning w/ RW under PT supervision, demonstrated improved stride length and safety awareness w/ RW. Pt also safely ascended and descended 4 steps using a R railing after receiving verbal cues and demonstration. Overall patient is progressing and still displays strength, ROM, and balance deficits that will benefit from skilled PT to further improve functional mobility. Pt completed all acute care physical therapy goals and is safe for household mobility tasks; recommend HHPT.    Follow Up Recommendations  Home health PT     Equipment Recommendations  Rolling walker with 5" wheels    Recommendations for Other Services       Precautions / Restrictions Precautions Precautions: Fall Restrictions Weight Bearing Restrictions: Yes LUE Weight Bearing: Weight bearing as tolerated    Mobility  Bed Mobility Overal bed mobility: Modified Independent Bed Mobility: Supine to Sit     Supine to sit: Modified independent (Device/Increase time)     General bed mobility comments: use of bed rails and required increase time to advance surgical LE over EOB  Transfers Overall transfer level: Modified independent Equipment used: Rolling walker (2 wheeled) Transfers: Sit to/from Stand Sit to Stand: Modified independent (Device/Increase time)         General transfer comment: Good hand placement, use of RW for additional stability,    Ambulation/Gait Ambulation/Gait assistance: Supervision Ambulation Distance (Feet): 200 Feet Assistive device: Rolling walker (2 wheeled) Gait Pattern/deviations: Step-through pattern;Trunk flexed   Gait velocity interpretation: at or above normal speed for age/gender General Gait Details: pt ambulated w/ increased gait speed w/ RW, good step through pattern and demonstrated improved safety awareness w/ RW, no signs of knee buckling or instability   Stairs Stairs: Yes   Stair Management: One rail Right Number of Stairs: 4 General stair comments: able to ascend and descend after demonstration and verbal cues from PT for hand placement on rail and technique, no signs of instability or LOB ambulating stairs   Wheelchair Mobility    Modified Rankin (Stroke Patients Only)       Balance Overall balance assessment: Needs assistance Sitting-balance support: Feet supported Sitting balance-Leahy Scale: Normal Sitting balance - Comments: able to maintain static sitting posture w/o swaying    Standing balance support: Bilateral upper extremity supported Standing balance-Leahy Scale: Good Standing balance comment: forward flexed in stance but is present at baseline, RW required for additional stability                    Cognition Arousal/Alertness: Awake/alert Behavior During Therapy: WFL for tasks assessed/performed Overall Cognitive Status: Within Functional Limits for tasks assessed                      Exercises Total Joint Exercises Goniometric ROM: 1-95 active assist ROM for L knee Other Exercises Other Exercises: supine therex; AROM; 1x15 to improve strength for functional tasks; ankle pumps, quad sets, heel slides, SLR, hip ABD, SAQs-  cuing for proper technique Other Exercises: sitting therex: AROM 1x12 to improve strength for functional tasks; LAQs and heel slides    General Comments        Pertinent Vitals/Pain Pain Assessment: No/denies pain     Home Living                      Prior Function            PT Goals (current goals can now be found in the care plan section) Acute Rehab PT Goals Patient Stated Goal: To return home safely PT Goal Formulation: With patient Time For Goal Achievement: 04/06/16 Potential to Achieve Goals: Good Progress towards PT goals: Progressing toward goals    Frequency    BID      PT Plan Current plan remains appropriate    Co-evaluation             End of Session Equipment Utilized During Treatment: Gait belt Activity Tolerance: Patient tolerated treatment well Patient left: in chair;with call bell/phone within reach;with chair alarm set;Other (comment) (polar care applied)     Time: [id number] PT Time Calculation (min) (ACUTE ONLY): 35 min  Charges:                       G Codes:      [employer]  Student PT 03/24/2016, 10:10 AM

## 2016-03-24 NOTE — Progress Notes (Signed)
Remains alert and oriented. Denies pain. Ambulated to the bathroom during the night with 1 person assist. Hemovac in place. Dressing dry and intact. Bone foam in place and polar care. Staff will continue to monitor.

## 2016-03-24 NOTE — Progress Notes (Signed)
   Subjective: 2 Days Post-Op Procedure(s) (LRB): COMPUTER ASSISTED TOTAL KNEE ARTHROPLASTY (Left) Patient reports pain as 0 on 0-10 scale.   Patient is well, and has had no acute complaints or problems Continue to do physical therapy today.  Plan is to go Home after hospital stay. no nausea and no vomiting Patient denies any chest pains or shortness of breath. Objective: Vital signs in last 24 hours: Temp:  [97.4 F (36.3 C)-97.9 F (36.6 C)] 97.4 F (36.3 C) (02/13 2014) Pulse Rate:  [69-83] 72 (02/14 0725) Resp:  [16-20] 16 (02/14 0725) BP: (92-158)/(61-97) 158/97 (02/14 0725) SpO2:  [98 %-99 %] 99 % (02/14 0725) well approximated incision Heels are non tender and elevated off the bed using rolled towels Intake/Output from previous day: 02/13 0701 - 02/14 0700 In: 1920 [P.O.:720; I.V.:1100; IV Piggyback:100] Out: 800 [Urine:550; Drains:250] Intake/Output this shift: No intake/output data recorded.   Recent Labs  03/23/16 0351 03/24/16 0340  HGB 9.7* 8.6*    Recent Labs  03/23/16 0351 03/24/16 0340  WBC 8.7 8.6  RBC 3.02* 2.65*  HCT 28.6* 25.1*  PLT 78* 67*    Recent Labs  03/23/16 0351 03/24/16 0340  NA 132* 137  K 4.3 4.5  CL 105 110  CO2 20* 22  BUN 22* 21*  CREATININE 0.92 0.90  GLUCOSE 164* 99  CALCIUM 7.8* 8.1*   No results for input(s): LABPT, INR in the last 72 hours.  EXAM General - Patient is Alert, Appropriate and Oriented Extremity - Neurologically intact Neurovascular intact Sensation intact distally Intact pulses distally Dorsiflexion/Plantar flexion intact No cellulitis present Compartment soft Dressing - moderate drainage Motor Function - intact, moving foot and toes well on exam.    Past Medical History:  Diagnosis Date  . Atrial fibrillation ([place])   . Broken neck ([place])   . Chicken pox   . Diabetes mellitus without complication ([place])   . Diverticulosis   . Heart murmur    as child  . Hyperlipidemia   . Hypertension    . Prostate cancer ([place])   . Prostate cancer ([place])   . Skin cancer   . Sleep apnea   . TIA (transient ischemic attack) 2009    Assessment/Plan: 2 Days Post-Op Procedure(s) (LRB): COMPUTER ASSISTED TOTAL KNEE ARTHROPLASTY (Left) Active Problems:   S/P total knee arthroplasty  Estimated body mass index is 26.31 kg/m as calculated from the following:   Height as of this encounter: 5\' 7"  (1.702 m).   Weight as of this encounter: 76.2 kg (168 lb). Up with therapy Discharge home with home health  Labs: reviewed DVT Prophylaxis - Xarelto Weight-Bearing as tolerated to left leg hemovac discontinued Dressing change  Xiamara Hulet R. [place] 03/24/2016, 7:39 AM

## 2016-03-24 NOTE — Care Management (Signed)
PT recommending a walker. Ordered from advanced.

## 2016-03-24 NOTE — Progress Notes (Signed)
Patient is being discharged home with family. IVs removed with cath intact. Scripts given to patient. Meds were reviewed along with last dose given. Wallet and cell phone returned to patient via security.Waiting for walker and ride at this time.

## 2016-03-26 ENCOUNTER — Encounter: Payer: Self-pay | Admitting: Emergency Medicine

## 2016-03-26 ENCOUNTER — Emergency Department
Admission: EM | Admit: 2016-03-26 | Discharge: 2016-03-26 | Disposition: A | Discharge status: Home / Self Care | Payer: Medicare Other | Attending: Emergency Medicine | Admitting: Emergency Medicine

## 2016-03-26 DIAGNOSIS — Z79899 Other long term (current) drug therapy: Secondary | ICD-10-CM | POA: Diagnosis not present

## 2016-03-26 DIAGNOSIS — I10 Essential (primary) hypertension: Secondary | ICD-10-CM | POA: Insufficient documentation

## 2016-03-26 DIAGNOSIS — E119 Type 2 diabetes mellitus without complications: Secondary | ICD-10-CM | POA: Diagnosis not present

## 2016-03-26 DIAGNOSIS — Z7984 Long term (current) use of oral hypoglycemic drugs: Secondary | ICD-10-CM | POA: Insufficient documentation

## 2016-03-26 DIAGNOSIS — Z85828 Personal history of other malignant neoplasm of skin: Secondary | ICD-10-CM | POA: Insufficient documentation

## 2016-03-26 DIAGNOSIS — Z139 Encounter for screening, unspecified: Secondary | ICD-10-CM

## 2016-03-26 DIAGNOSIS — M25562 Pain in left knee: Secondary | ICD-10-CM | POA: Diagnosis not present

## 2016-03-26 DIAGNOSIS — Z0000 Encounter for general adult medical examination without abnormal findings: Secondary | ICD-10-CM | POA: Diagnosis present

## 2016-03-26 DIAGNOSIS — Z8546 Personal history of malignant neoplasm of prostate: Secondary | ICD-10-CM | POA: Insufficient documentation

## 2016-03-26 NOTE — ED Triage Notes (Signed)
Patient presents to ED via POV with request to be placed in a nursing home. Patient had a knee replacement on Monday. Since then patient has been have difficulty taking care of himself. Patient with pressured speech in triage.

## 2016-03-26 NOTE — ED Notes (Signed)
Dr. Marry Guan speaking to patient to set up home health. Per Dr. Marry Guan patient is cleared for discharged. Charge RN aware.

## 2016-03-26 NOTE — ED Provider Notes (Signed)
[facility] Emergency Department Provider Note        Time seen: ----------------------------------------- 5:22 PM on 03/26/2016 -----------------------------------------   HISTORY  Chief Complaint Failure To Thrive    HPI Nicholas Patel is a 83 y.o. male Who presents to the ER being brought by private vehicle requesting placement into a nursing home. Patient had knee replacement on Monday and the patient has been feeling poorly since then. Patient states he felt dizzy this morning but currently symptoms have resolved. He has had a difficult time taking care of himself. He denies any recent illness or other complaints at this time.  Past Medical History:  Diagnosis Date  . Atrial fibrillation ([place])   . Broken neck ([place])   . Chicken pox   . Diabetes mellitus without complication ([place])   . Diverticulosis   . Heart murmur    as child  . Hyperlipidemia   . Hypertension   . Prostate cancer ([place])   . Prostate cancer ([place])   . Skin cancer   . Sleep apnea   . TIA (transient ischemic attack) 2009    Patient Active Problem List   Diagnosis Date Noted  . S/P total knee arthroplasty 03/22/2016  . Persistent atrial fibrillation ([place]) 02/17/2016  . Paroxysmal atrial fibrillation ([place]) 12/01/2015  . Pure hypercholesterolemia 12/01/2015  . Type 2 diabetes mellitus without complication, without long-term current use of insulin ([place]) 12/01/2015  . Prostate CA ([place]) 08/27/2015  . Borderline diabetes 05/21/2015  . Hyperlipidemia 05/21/2015  . PAF (paroxysmal atrial fibrillation) ([place]) 05/21/2015  . Chronic a-fib ([place]) 08/30/2014    Past Surgical History:  Procedure Laterality Date  . BASAL CELL CARCINOMA EXCISION    . COLON SURGERY     removed 2.5 foot of colon  . HERNIA REPAIR Left    inguinal  . INSERT / REPLACE / REMOVE PACEMAKER  05/2006  . KNEE ARTHROPLASTY Left 03/22/2016   Procedure: COMPUTER ASSISTED TOTAL KNEE ARTHROPLASTY;  Surgeon: Dereck Leep, MD;  Location: [facility] ORS;  Service: Orthopedics;  Laterality: Left;  . MOHS SURGERY    . SHOULDER ARTHROSCOPY W/ ROTATOR CUFF REPAIR Left     Allergies Other and Hyman Hopes allergy]  Social History Social History  Substance Use Topics  . Smoking status: Never Smoker  . Smokeless tobacco: Never Used  . Alcohol use 0.6 oz/week    1 Glasses of wine per week     Comment: qd    Review of Systems Constitutional: Negative for fever. Cardiovascular: Negative for chest pain. Respiratory: Negative for shortness of breath. Gastrointestinal: Negative for abdominal pain, vomiting and diarrhea. Genitourinary: Negative for dysuria. Musculoskeletal: Positive for knee pain Skin: Negative for rash. Neurological: Negative for headaches, positive for generalized weakness, Positive for dizziness  10-point ROS otherwise negative.  ____________________________________________   PHYSICAL EXAM:  VITAL SIGNS: ED Triage Vitals  Enc Vitals Group     BP 03/26/16 1559 106/64     Pulse Rate 03/26/16 1559 75     Resp 03/26/16 1559 20     Temp 03/26/16 1559 99.4 F (37.4 C)     Temp Source 03/26/16 1559 Oral     SpO2 03/26/16 1559 100 %     Weight 03/26/16 1558 162 lb (73.5 kg)     Height 03/26/16 1558 5\' 7"  (1.702 m)     Head Circumference --      Peak Flow --      Pain Score 03/26/16 1606 0  Pain Loc --      Pain Edu? --      Excl. in [place]? --     Constitutional: Alert and oriented. Well appearing and in no distress. Eyes: Conjunctivae are normal. Normal extraocular movements. Cardiovascular: Irregularly irregular rhythm. Systolic murmur Respiratory: Normal respiratory effort without tachypnea nor retractions. Breath sounds are clear and equal bilaterally. No wheezes/rales/rhonchi. Gastrointestinal: Soft and nontender. Normal bowel sounds Musculoskeletal: Left knee evaluated by orthopedic surgery. Satisfactory postoperatively Neurologic:  Normal speech and language. No gross  focal neurologic deficits are appreciated.  Skin:  Skin is warm, dry and intact. No rash noted. ____________________________________________  ED COURSE:  Pertinent labs & imaging results that were available during my care of the patient were reviewed by me and considered in my medical decision making (see chart for details). Patient presents to the ER requesting nursing home placement. Prior to my evaluation he was evaluated by orthopedic surgery who has encouraged outpatient management of his symptoms.   Procedures  ____________________________________________  FINAL ASSESSMENT AND PLAN  Medical screening exam  Plan: Patient presented for possible nursing home placement. He appears medically stable at this time and does not wish further treatment today, he will follow up as to recognize an outpatient.   Earleen Newport, MD   Note: This note was generated in part or whole with voice recognition software. Voice recognition is usually quite accurate but there are transcription errors that can and very often do occur. I apologize for any typographical errors that were not detected and corrected.     Earleen Newport, MD 03/26/16 [phone]

## 2016-03-26 NOTE — ED Notes (Signed)
Dr. Jimmye Norman speaking to patient. Per verbal from Dr. Jimmye Norman patient is cleared for discharge.

## 2016-03-26 NOTE — ED Notes (Signed)
Patient denies any complaints at this time. Patient states, "I need total care. I cannot take care of myself."

## 2016-05-06 ENCOUNTER — Encounter: Payer: Self-pay | Admitting: Internal Medicine

## 2016-06-09 ENCOUNTER — Encounter: Payer: Self-pay | Admitting: Urology

## 2016-06-09 ENCOUNTER — Encounter: Payer: Self-pay | Admitting: Internal Medicine

## 2016-12-29 ENCOUNTER — Other Ambulatory Visit: Payer: Medicare Other

## 2017-01-05 ENCOUNTER — Ambulatory Visit: Payer: Medicare Other | Admitting: Urology

## 2017-08-19 ENCOUNTER — Encounter: Payer: Self-pay | Admitting: Cardiology

## 2018-04-18 IMAGING — DX DG KNEE 1-2V PORT*L*
2 series · 2 of 2 positions shown · non-contrast
Comparison: None.

CLINICAL DATA: Status post left total knee replacement. Initial
encounter.

EXAM:
PORTABLE LEFT KNEE - 1-2 VIEW

[knee ap]
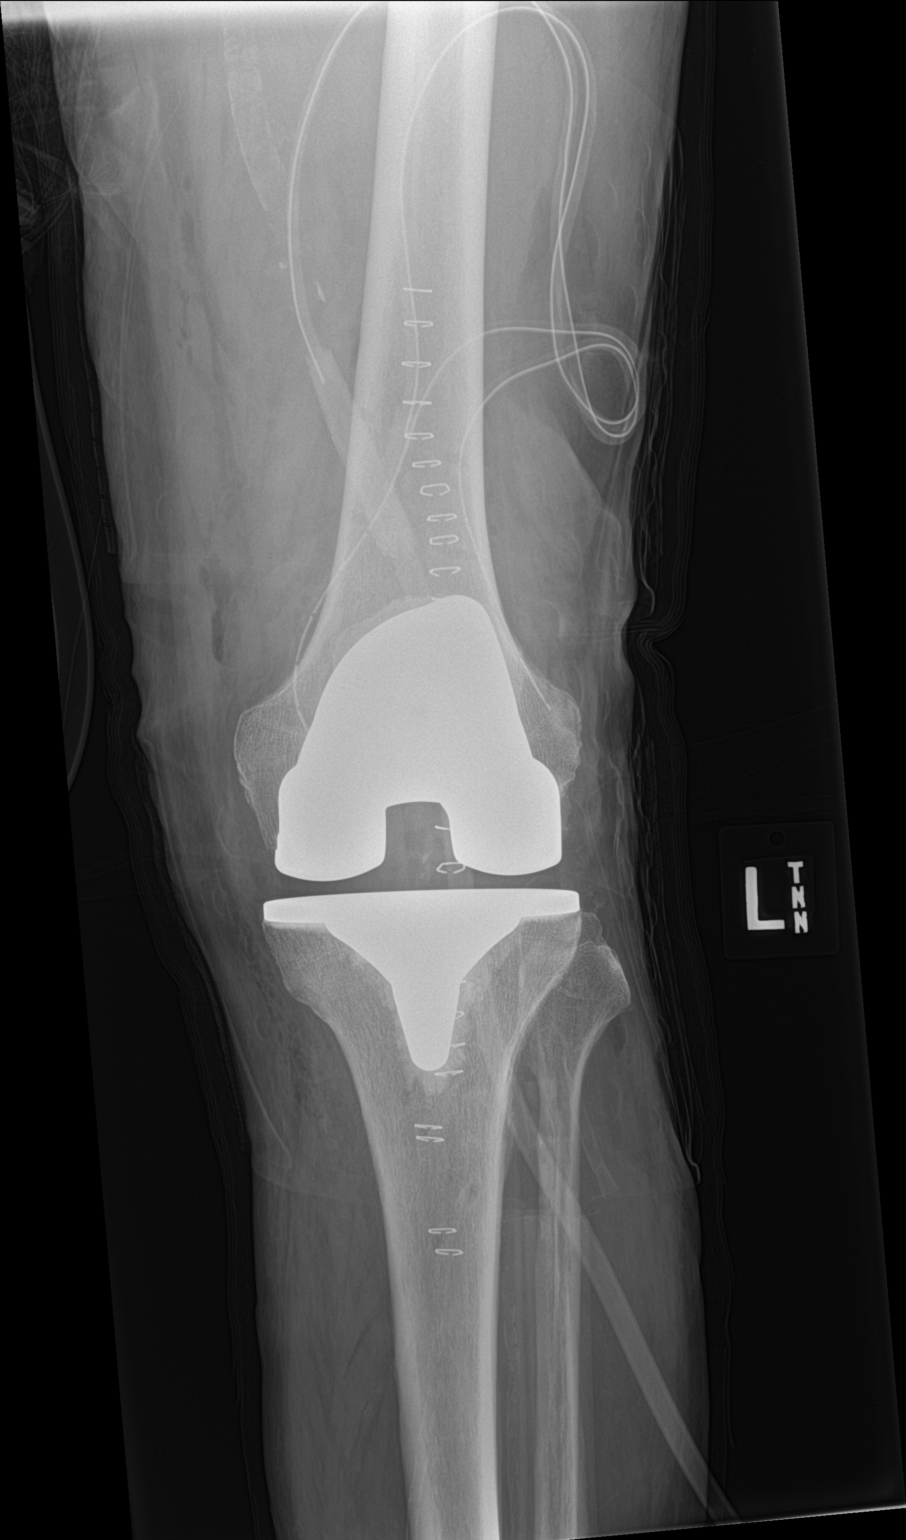

[knee lat]
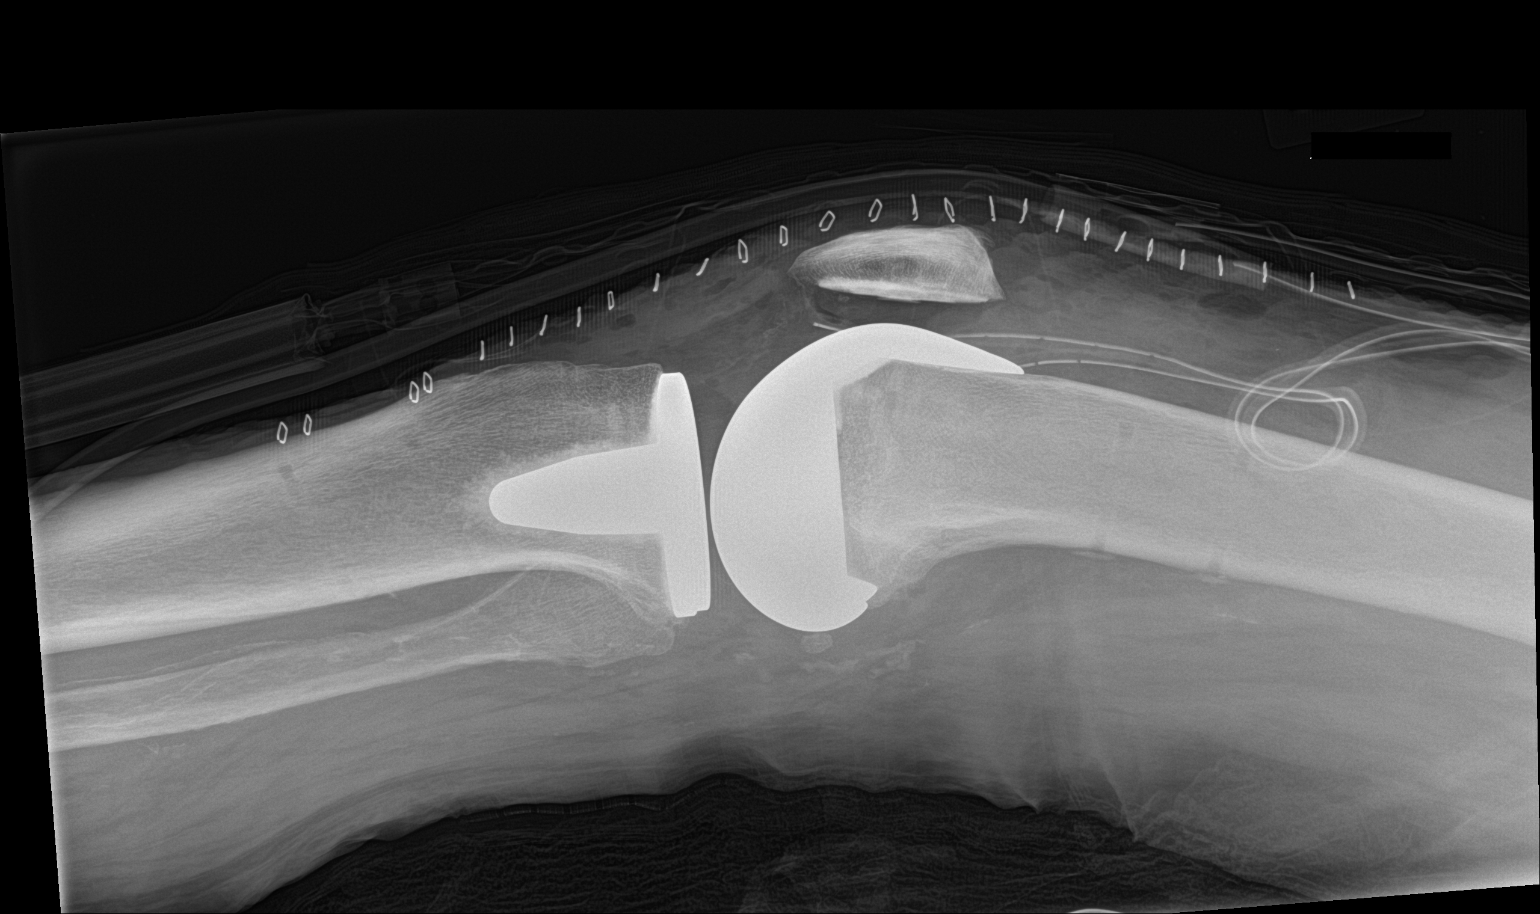

[2 of 2 positions shown; findings below may reference images not displayed]

FINDINGS: The patient's total knee arthroplasty is grossly unremarkable in
appearance, without evidence of loosening or new fracture. Overlying
drainage catheters are seen, with associated skin staples and
scattered postoperative soft tissue air. Scattered vascular
calcifications are seen.
IMPRESSION: Status post total knee arthroplasty, without evidence of loosening
or new fracture.

## 2018-11-22 ENCOUNTER — Inpatient Hospital Stay: Admit: 2018-11-22 | Discharge: 2018-11-22 | Payer: MEDICARE | Primary: Family

## 2018-11-22 ENCOUNTER — Institutional Professional Consult (permissible substitution): Admit: 2018-11-22 | Discharge: 2018-11-22 | Payer: MEDICARE | Attending: Internal Medicine | Primary: Family

## 2018-11-22 DIAGNOSIS — D696 Thrombocytopenia, unspecified: Secondary | ICD-10-CM

## 2018-11-22 NOTE — Progress Notes (Signed)
Patient Name:  Dalton Warner  Patient DOB:  May 11, 1930  Patient MRN:  [id number]     Primary Oncologist: Charisse Klinefelter, MD  Referring Provider: Carloyn Manner, APRN - CNP     Date of Service: 11/22/2018      Reason for Consult:  Thrombocytopenia     Chief Complaint:    Chief Complaint   Patient presents with   ??? New Patient       Encounter Diagnosis   Name Primary?   ??? Thrombocytopenia ([place])         HPI:   11/22/18: Arrived alone to the clinic today. Loves to talk.  According to Dr. Hortense Ramal his oncologist at [facility] he was diagnosed with prostate cancer in 2010.  Received external beam radiation therapy.  He has been on Xarelto since 2017 for A. fib.  Prior to which he was on Coumadin since 2008.  Further work-up was negative for hepatitis B, hepatitis C and HIV.  No evidence of hemolysis.  B12 was on lower side.  Folate was normal.  Iron studies were normal.  Serum protein electrophoresis was normal.  But imaging of fixation revealed a small IgG lambda monoclonal band.  Was advised to be on B12 supplements.  Today at the office he reported that he had hernia surgery on July 24 when he received platelet transfusion.  Reported that his been caregiver to his wife for 20 years and is currently at [facility] home at the other mass unit.  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  Married for 67 years.  Lost a lot of weight in the process of taking care of his wife.  Reported tiredness.  No bleeding or any rash.  No chest pain.  Reported arthritis pain all the time.  Reported that he is taking oral B12 supplements thousand micrograms daily.    07/20/2018 CBC with WBC of 6.52 hemoglobin of 11.0 hematocrit 34.7 MCV of 93.3 platelets of 78 diff within normal limits    28M  platelet count was 81.    6 months ago it was 85K    09/2018: CT chest:1. Ectasia of the ascending thoracic aorta measuring 4.2 cm in short-axis diameter. ??This may be due to aortic stenosis considering the  aortic valve calcifications present.  2. No evidence of aortic dissection.  3. Atherosclerotic calcification of the 3 major native coronary arteries.  4. Moderate dilation of cardiac chambers.  5. Evidence of prior granulomatous disease with no definite pneumonia or suspicious-appearing pulmonary nodules.  6. Trace right pleural effusion.  7. Mild fatty infiltration of the liver.  8. Small sliding-type hiatal hernia.    09/08/18: Ultrasound:1. Mild ascites evident in the left upper quadrant.  2. Nonvisualized pancreas and majority of the abdominal aorta due to overlying bowel gas.  3. Otherwise unremarkable study including no evidence of cirrhosis and normal splenic size measuring 9.6 cm.         Past Medical History:     A. fib, osteoarthritis, borderline diabetes.  Seasonal allergies, prostate cancer hypertension.                                                           Past Surgery History:      Basal cell skin cancer  removal, cardiac pacemaker, colectomy for diverticular disease, hernia repair, left knee replacement, appendectomy                                                              Social History:   Lives alone.  Has 3 children, they live in [place] in [place].  Retired as Financial trader, [employer] position.  Denies any smoking history or any other illicit drug abuse.                                                                                                    Family History:    Mother was a non-smoker was diagnosed with lung cancer.  Younger brother with colon cancer.  His sister was diagnosed with uterine, ovarian cancer at the age of 11.                                                                                           Allergies   Allergen Reactions   ??? Cat Hair Extract Other (See Comments)     Runny nose   ??? Dust Mite Extract Other (See Comments)     Runny nose   ??? Atorvastatin      Other reaction(s): Traumatic or non-traumatic rupture of tendon   ??? Other    ??? Fish Allergy Nausea And  Vomiting     [place] only       Current Outpatient Medications on File Prior to Visit   Medication Sig Dispense Refill   ??? clotrimazole-betamethasone (LOTRISONE) 1-0.05 % cream      ??? ketoconazole (NIZORAL) 2 % cream      ??? montelukast (SINGULAIR) 10 MG tablet      ??? albuterol sulfate (PROAIR RESPICLICK) 295 (90 Base) MCG/ACT aerosol powder inhalation      ??? loratadine (CLARITIN) 10 MG capsule      ??? fluticasone (FLONASE) 50 MCG/ACT nasal spray      ??? mometasone (NASONEX) 50 MCG/ACT nasal spray        No current facility-administered medications on file prior to visit.         Review of Systems:    Constitutional:  No weight loss, No fever, No chills, No night sweats.  Energy level fair  Eyes:  No diplopia, No transient or permanent loss of vision, No scotomata.  ENT / Mouth:  No epistaxis, No dysphagia, No hoarseness, No oral ulcers, No gingival bleeding.  No sore throat, No postnasal drip, No nasal drip, No mouth pain, No sinus pain,  No tinnitus, Normal hearing.  Cardiovascular:  No chest pain, No palpitations, No syncope, No upper extremity edema, No lower extremity edema, No calf discomfort.  Respiratory:  No cough.  No hemoptysis, No pleurisy, No wheezing, No dyspnea.  Gastrointestinal:  No abdominal pain, No abdominal cramping, No nausea, No vomiting, No constipation, No diarrhea, No hematochezia, No melena, No jaundice, No dyspepsia, No dysphagia.  Urinary:  No dysuria, No hematuria, No urinary incontinence.  Musculoskeletal:   Joint pains  Skin:  No rash, No nodules, No pruritus, No lesions.  Neurologic:  No confusion, No seizures, No syncope, No tremor, No speech change, No headache, No hiccups, No abnormal gait, No sensory changes, No weakness.     Vital Signs: BP 120/77 (Site: Left Upper Arm, Position: Sitting, Cuff Size: Medium Adult)    Pulse 72    Temp 97.1 ??F (36.2 ??C) (Infrared)    Resp 16    Ht _0  (1.702 m)    Wt 154 lb (69.9 kg)    SpO2 94%    BMI 24.12 kg/m??      Physical Exam:  CONSTITUTIONAL:  awake, alert, cooperative, very talkative  EYES:PEAR, no palor or any icetrus  ZDG:UYQI  NECK: No JVD  HEMATOLOGIC/LYMPHATIC: no cervical, supraclavicular or axillary lymphadenopathy   LUNGS: CTAB  CARDIOVASCULAR: s1s2 rrr ESM  ABDOMEN: soft ntnd bs pos  NEUROLOGIC: GI  SKIN: No rsh  EXTREMITIES: no LE edema bilaterally     Labs:  Hematology:  No results found for: WBC, RBC, HGB, HCT, MCV, MCH, MCHC, RDW, PLT, MPV, BANDSPCT, SEGSPCT, EOSRELPCT, BASOPCT, LYMPHOPCT, MONOPCT, BANDABS, SEGSABS, EOSABS, BASOSABS, LYMPHSABS, MONOSABS, DIFFTYPE, ANISOCYTOSIS, POLYCHROM, WBCMORP, PLTM  No results found for: ESR  Chemistry:  No results found for: NA, K, CL, CO2, BUN, CREATININE, GLUCOSE, CALCIUM, PROT, LABALBU, BILITOT, ALKPHOS, AST, ALT, LABGLOM, GFRAA, AGRATIO, GLOB, PHOS, MG, POCCA, POCGLU  No results found for: MMA, LDH, HOMOCYSTEINE  No components found for: LD  No results found for: TSHHS, T4FREE, FT3  Immunology:  No results found for: PROT, SPEP, ALBUMINELP, LABALPH, LABBETA, GAMGLOB  No results found for: KAPPAUVOL, LAMBDAUVOL, KLFLCR  No results found for: B2M  Coagulation Panel:  No results found for: PROTIME, INR, APTT, DDIMER  Anemia Panel:  No results found for: VITAMINB12, FOLATE  Tumor Markers:  No results found for: CA125, CEA, CA199, LABCA2, PSA     Observations:  ECOG:  PHQ-9 Total Score: 0 (11/22/2018  9:29 AM)       Assessment & Plan:                                                          Chronic thrombocytopenia: Etiology could be secondary to underlying MDS.  Cytogenetics were normal, blast being 2% his IPSS score was about 1.5 which is a low score.  Recommend observation at this point.    Normocytic anemia.  Appears to be chronic.  Note low B12 and absent bone marrow iron stores.  Recommended continuation of oral B12 and oral iron.    As above B12 borderline low normal.  Continue B12 supplements.    Monoclonal gammopathy noted to have small IgG lambda band on the immunofixation but normal serum  protein like to pheresis.  Normal renal function normal calcium levels.  Serum immunoglobulins and serum free light chains  were normal.  No evidence of any myeloma defining disease.  We will continue to monitor.    Paroxysmal atrial fibrillation.  Has pacemaker.  Continue Xarelto.  Denies any bleeding or any rash.  Continue as long as a platelet count of more than 50 K.      Family history of colon cancer.  Reported that he is up-to-date with the colonoscopy.    Family history ovarian cancer.  No genetic testing has been done.    Continue other medical care.    Thank you for letting us be part of the care and will follow along.    Discussed above findings and plan with him and he voiced understanding.  Answered all his questions.    Discussed healthy lifestyle including healthy diet, regular exercise as tolerated.  Also discussed importance of being up-to-date with age-appropriate screening tools.    Recommend follow-up with primary care physician another specialist.    Please do not hesitate to contact us if you need further information.    Return to clinic March 25, 2018 or earlier if new symptoms.    [place]

## 2018-11-22 NOTE — Progress Notes (Signed)
MA Rooming Questions  Patient: Dalton Warner  MRN: [id number]    Date: 11/22/2018        NEW PATIENT     5. Did the patient have a depression screening completed today? Yes    PHQ-9 Total Score: 0 (11/22/2018  9:29 AM)       PHQ-9 Given to (if applicable):               PHQ-9 Score (if applicable):                     []  Positive     []   Negative              Does question #9 need addressed (if applicable)                     []  Yes    []   No               Glenford Peers, MA

## 2018-12-19 ENCOUNTER — Encounter

## 2018-12-25 ENCOUNTER — Encounter

## 2018-12-29 ENCOUNTER — Inpatient Hospital Stay: Admit: 2018-12-29 | Payer: MEDICARE | Primary: Family

## 2018-12-29 DIAGNOSIS — M25511 Pain in right shoulder: Secondary | ICD-10-CM

## 2018-12-29 MED ORDER — IOPAMIDOL 76 % IV SOLN
76 % | Freq: Once | INTRAVENOUS | Status: AC | PRN
Start: 2018-12-29 — End: 2018-12-29
  Administered 2018-12-29: 18:00:00 15 mL

## 2018-12-29 MED ORDER — IOPAMIDOL 76 % IV SOLN
76 | INTRAVENOUS | Status: AC
Start: 2018-12-29 — End: 2018-12-29

## 2018-12-29 MED FILL — ISOVUE-370 76 % IV SOLN: 76 % | INTRAVENOUS | Qty: 100

## 2018-12-29 MED FILL — ISOVUE-370 76 % IV SOLN: 76 % | INTRAVENOUS | Qty: 50

## 2019-01-31 NOTE — Telephone Encounter (Signed)
Patient called and indicated his shoulder surgery at [facility] was canceled due to his plt count of 74.  Patient wanted our office to call [facility] and give approval for surgery.  I called and spoke with Truman Hayward nurse at [facility] and she indicated they will not complete the surgery at there facility because he has to many risk factors not just due to platelet count.  Per Dr. Grandville Silos office patient to be re schedule at [facility].

## 2019-02-06 NOTE — Progress Notes (Signed)
Surgery clearance request form faxed to Dr. Grandville Silos at [place] [phone] confirmation received.

## 2019-02-19 NOTE — Anesthesia Pre-Procedure Evaluation (Signed)
Department of Anesthesiology  Preprocedure Note       Name:  Dalton Warner   Age:  86 y.o.  DOB:  [birth year]                                          MRN:  [id number]         Date:  02/19/2019      Surgeon: Juliann Mule):  Avel Sensor, MD    Procedure: Procedure(s):  RIGHT SHOULDER ARTHROSCOPY ROTATOR CUFF REPAIR  SHOULDER TOTAL ARTHROPLASTY REVERSE    Medications prior to admission:   Prior to Admission medications    Medication Sig Start Date End Date Taking? Authorizing Provider   albuterol sulfate (PROAIR RESPICLICK) 123XX123 (90 Base) MCG/ACT aerosol powder inhalation     Historical Provider, MD   loratadine (CLARITIN) 10 MG capsule     Historical Provider, MD   clotrimazole-betamethasone (LOTRISONE) 1-0.05 % cream  02/09/18   Historical Provider, MD   fluticasone (FLONASE) 50 MCG/ACT nasal spray     Historical Provider, MD   ketoconazole (NIZORAL) 2 % cream  10/23/18   Historical Provider, MD   montelukast (SINGULAIR) 10 MG tablet  01/09/18   Historical Provider, MD   mometasone (NASONEX) 50 MCG/ACT nasal spray     Historical Provider, MD       Current medications:    Current Outpatient Medications   Medication Sig Dispense Refill   ??? albuterol sulfate (PROAIR RESPICLICK) 123XX123 (90 Base) MCG/ACT aerosol powder inhalation      ??? loratadine (CLARITIN) 10 MG capsule      ??? clotrimazole-betamethasone (LOTRISONE) 1-0.05 % cream      ??? fluticasone (FLONASE) 50 MCG/ACT nasal spray      ??? ketoconazole (NIZORAL) 2 % cream      ??? montelukast (SINGULAIR) 10 MG tablet      ??? mometasone (NASONEX) 50 MCG/ACT nasal spray        No current facility-administered medications for this encounter.        Allergies:    Allergies   Allergen Reactions   ??? Cat Hair Extract Other (See Comments)     Runny nose   ??? Dust Mite Extract Other (See Comments)     Runny nose   ??? Atorvastatin      Other reaction(s): Traumatic or non-traumatic rupture of tendon   ??? Other    ??? Fish Allergy Nausea And Vomiting     Salmon only       Problem List:     Patient Active Problem List   Diagnosis Code   ??? Thrombocytopenia ([place]) D69.6       Past Medical History:  No past medical history on file.    Past Surgical History:  No past surgical history on file.    Social History:    Social History     Tobacco Use   ??? Smoking status: Not on file   Substance Use Topics   ??? Alcohol use: Not on file                                Counseling given: Not Answered      Vital Signs (Current): There were no vitals filed for this visit.  BP Readings from Last 3 Encounters:   11/22/18 120/77       NPO Status:                                                                                 BMI:   Wt Readings from Last 3 Encounters:   11/22/18 154 lb (69.9 kg)         CBC  Lab Results   Component Value Date/Time    WBC 7.3 02/20/2019 01:00 PM    HGB 11.2 (L) 02/20/2019 01:00 PM    HCT 35.0 (L) 02/20/2019 01:00 PM    PLT 140 02/20/2019 01:00 PM     RENAL  Lab Results   Component Value Date/Time    NA 137 02/20/2019 01:00 PM    K 4.7 02/20/2019 01:00 PM    CL 103 02/20/2019 01:00 PM    CO2 21 02/20/2019 01:00 PM    BUN 22 02/20/2019 01:00 PM    CREATININE 0.9 02/20/2019 01:00 PM    GLUCOSE 132 (H) 02/20/2019 01:00 PM       COVID-19 Screening (If Applicable): No results found for: COVID19      Anesthesia Evaluation  Patient summary reviewed and Nursing notes reviewed  Airway:         Dental:          Pulmonary:   (+) COPD (chronic bronchitis ):  sleep apnea (unable to tolerate): on noncompliant,                            ROS comment:   Non smoker    PAT Airway. Limited exam. COVID 19 precautions. Patient wearing mask  Head/Neck movement: full, Hx fx C4  History of difficult intubation:  None  Teeth: missing upper right molars   Able to lie flat         COVID 19 screening  Denies recent fever or known COVID 19 exposure.  Instructed to quarantine until surgery and report any new respiratory or fever symptoms to surgeon.   Aware COVID testing must be  completed before DOS.   Recent COVID infection:   NO  Received 1st COVID vaccine 02/19/2019   COVID swab:  02/20/2019    CXR:  02/20/2019  No acute process.   Cardiovascular:    (+) hypertension (on beta blocker ):, pacemaker (implanted, 2008. New pacer ~07/2017.  [employer]. Jude. No interrogation for review): pacemaker, dysrhythmias (chronic AF, on xarelto, last dose 02/20/2019.  ): atrial fibrillation, hyperlipidemia      ECG reviewed        Stress test reviewed  Cleared by cardiology           ROS comment: Cardiac evaluation per Dr. Evalee Mutton.  Note in Care Everywhere. From my standpoint he is an acceptable risk and should proceed with this much-needed surgery to improve his quality of life. No further cardiac testing is needed and he can proceed with surgery. Hold Xarelto for 2 days before surgery and resume postop as soon as possible.      Stress test 09/2017  Nuclear Cardiology Conclusion:  Normal Regadenoson myocardial perfusion ??with Tc-88m  sestamibi imaging.  No convincing evidence of inducible ischemia identified. Global left  ventricular systolic function was normal, with an EF of 66%. Compared to  the prior study (02/27/2010), no clinically important changes are noted.      Stress ECG Conclusion:  Non-diagnostic pharmacologic ??Regadenoson Stress Test Non-diagnostic  changes secondary to resting ST-T abnormalities. HR Response to stress:  normal BP response to stress: hypotensive after Lexiscan. The patient  experienced no chest pain.      CP or SOB: NO   Exercise: NO     EKG:  02/20/2019  Atrial fibrillation with frequent ventricular-paced complexes and with premature ventricular or aberrantly conducted complexes   Right bundle branch block          Neuro/Psych:   (+) neuromuscular disease (acute nondisplaced type 2 dens neck fracture 2/2 fall, 12/2014, wore a C-Collar x 6 weeks. Full ROM neck. Neuropathy. CBP s/p injections. Unstable gait, uses walker.  FALL RISK ):, TIA (2009), psychiatric history (1-2  glass wine per day. Recent widower ):             ROS comment: HEENT:  HOH, bil hearing aids GI/Hepatic/Renal:   (+) PUD (08/2018), renal disease (BPH, prostate CA, 2010, treated with radiation):,          ROS comment: Diverticulitis s/p partial colectomy, 2000  Colon CA?.   Endo/Other:    (+) Diabetes (borderline diabetes.  ), blood dyscrasia (chronic thrombocytopenia, hx platelet transfusion 08/2018. Followed by Dr Franchot Mimes.  HGB: 11.2, HCT: 35.0, PLT: 140K, 02/20/2019): anemia, arthritis (knees and right shoulder.  s/p left TKA, 2018): OA., malignancy/cancer (skin and prostate).          Pt had PAT visit.        ROS comment: Right shoulder rotator cuff capsule with pain, limited ROM  S/p left rotator cuff repair, 2015    Recurrent left inguinal hernia, repaired 1980, last with mesh, 08/2018 Abdominal:           Vascular:   + DVT (esophagus ), .                         Anesthesia Plan        [place], APRN - CNP   02/19/2019

## 2019-02-20 ENCOUNTER — Inpatient Hospital Stay: Admit: 2019-02-20 | Payer: MEDICARE | Primary: Family

## 2019-02-20 ENCOUNTER — Encounter: Payer: MEDICARE | Primary: Family

## 2019-02-20 DIAGNOSIS — Z01818 Encounter for other preprocedural examination: Secondary | ICD-10-CM

## 2019-02-20 LAB — COMPREHENSIVE METABOLIC PANEL
ALT: 17 U/L (ref 10–40)
AST: 17 IU/L (ref 15–37)
Albumin: 4.2 GM/DL (ref 3.4–5.0)
Alkaline Phosphatase: 62 IU/L (ref 40–129)
Anion Gap: 13 (ref 4–16)
BUN: 22 MG/DL (ref 6–23)
CO2: 21 MMOL/L (ref 21–32)
Calcium: 8.9 MG/DL (ref 8.3–10.6)
Chloride: 103 mMol/L (ref 99–110)
Creatinine: 0.9 MG/DL (ref 0.9–1.3)
GFR African American: >60 mL/min/{1.73_m2} (ref >60)
GFR Non-African American: >60 mL/min/{1.73_m2} (ref >60)
Glucose: 132 MG/DL — ABNORMAL HIGH (ref 70–99)
Potassium: 4.7 MMOL/L (ref 3.5–5.1)
Sodium: 137 MMOL/L (ref 135–145)
Total Bilirubin: 0.6 MG/DL (ref 0.0–1.0)
Total Protein: 6.3 GM/DL — ABNORMAL LOW (ref 6.4–8.2)

## 2019-02-20 LAB — EKG 12-LEAD
Atrial Rate: 66 {beats}/min
Q-T Interval: 416 ms
QRS Duration: 136 ms
QTc Calculation (Bazett): 442 ms
R Axis: 53 degrees
T Axis: -21 degrees
Ventricular Rate: 68 {beats}/min

## 2019-02-20 LAB — SEDIMENTATION RATE: Sed Rate: 5 MM/HR (ref 0–20)

## 2019-02-20 NOTE — Progress Notes (Signed)
Called Dr Coralee Rud received a COVID order- was one done previously or to be done at office- per staff to be done with pretesting and to fax over the order

## 2019-02-21 LAB — CBC WITH AUTO DIFFERENTIAL
Basophils %: 0.5 % (ref 0–1)
Basophils Absolute: 0 10*3/uL
Eosinophils %: 1.1 % (ref 0–3)
Eosinophils Absolute: 0.1 10*3/uL
Hematocrit: 35 % — ABNORMAL LOW (ref 42–52)
Hemoglobin: 11.2 GM/DL — ABNORMAL LOW (ref 13.5–18.0)
Immature Neutrophil %: 0.8 % — ABNORMAL HIGH (ref 0–0.43)
Lymphocytes %: 17.7 % — ABNORMAL LOW (ref 24–44)
Lymphocytes Absolute: 1.3 10*3/uL
MCH: 29.2 PG (ref 27–31)
MCHC: 32 % (ref 32.0–36.0)
MCV: 91.1 FL (ref 78–100)
Monocytes %: 10.4 % — ABNORMAL HIGH (ref 0–4)
Monocytes Absolute: 0.8 10*3/uL
Nucleated RBC %: 0 %
Platelets: 140 10*3/uL (ref 140–440)
RBC: 3.84 10*6/uL — ABNORMAL LOW (ref 4.6–6.2)
RDW: 20.4 % — ABNORMAL HIGH (ref 11.7–14.9)
Segs Absolute: 5.1 10*3/uL
Segs Relative: 69.5 % — ABNORMAL HIGH (ref 36–66)
Total Immature Neutrophil: 0.06 10*3/uL
Total Nucleated RBC: 0 10*3/uL
WBC: 7.3 10*3/uL (ref 4.0–10.5)

## 2019-02-21 LAB — CULTURE, URINE: Culture: NO GROWTH

## 2019-02-22 LAB — COVID-19 AMBULATORY: SARS-CoV-2: NOT DETECTED

## 2019-02-22 NOTE — Progress Notes (Signed)
Confirmed new arrival time of 0800.  Pt verbalizes understanding.  Rozanna Box

## 2019-02-22 NOTE — Progress Notes (Signed)
Pt expects to stay overnight at [facility] and be admitted to [facility]) on Saturday.  Call Herbert Seta [phone] to make arrangements for discharge from [facility] to [facility] on Saturday.

## 2019-02-23 LAB — PROTIME-INR
INR: 1.01 INDEX
Protime: 12.2 SECONDS (ref 11.7–14.5)

## 2019-02-23 LAB — POCT GLUCOSE: POC Glucose: 115 MG/DL — ABNORMAL HIGH (ref 70–99)

## 2019-02-23 MED ORDER — LACTATED RINGERS IV SOLN
INTRAVENOUS | Status: DC
Start: 2019-02-23 — End: 2019-02-23

## 2019-02-23 NOTE — H&P (Signed)
Patient's surgery was canceled.      Patient took blood thinner and unsafe to proceed with surgery.      Patient was discharged home uneventfully and will reschedule for next week

## 2019-02-23 NOTE — Progress Notes (Signed)
Called [facility] to come get pt, instructed pt to return to hospital on Tuesday for surgery

## 2019-02-23 NOTE — Progress Notes (Signed)
0830-Patient stated that he may have accidentally taken his Xarelto last night. He held it the 12th and 13th but thinks he took it last night. Notified Dr. Genelle Gather and Dr. Janee Morn.   0840-Stat PT INR drawn and IV started.     Dr Janee Morn came and talked to patient, procedure will be cancelled and rescheduled.

## 2019-02-26 NOTE — Anesthesia Pre-Procedure Evaluation (Addendum)
Department of Anesthesiology  Preprocedure Note       Name:  Dalton Warner   Age:  86 y.o.  DOB:  [birth year]                                          MRN:  [id number]         Date:  02/26/2019      Surgeon: Juliann Mule):  Avel Sensor, MD    Procedure: Procedure(s):  RIGHT REVERSE SHOULDER TOTAL ARTHROPLASTY REVERSE    Medications prior to admission:   Prior to Admission medications    Medication Sig Start Date End Date Taking? Authorizing Provider   guaiFENesin (MUCINEX) 600 MG extended release tablet Take 1,200 mg by mouth 2 times daily    Historical Provider, MD   acetaminophen (TYLENOL) 500 MG tablet Take 500 mg by mouth every 6 hours as needed for Pain    Historical Provider, MD   alfuzosin (UROXATRAL) 10 MG extended release tablet Take 10 mg by mouth daily    Historical Provider, MD   rivaroxaban (XARELTO) 20 MG TABS tablet Take 20 mg by mouth    Historical Provider, MD   atorvastatin (LIPITOR) 40 MG tablet Take 40 mg by mouth daily    Historical Provider, MD   metoprolol succinate (TOPROL XL) 50 MG extended release tablet Take 50 mg by mouth nightly     Historical Provider, MD   albuterol sulfate (PROAIR RESPICLICK) 123XX123 (90 Base) MCG/ACT aerosol powder inhalation 2 puffs every 6 hours as needed     Historical Provider, MD   loratadine (CLARITIN) 10 MG capsule Take 10 mg by mouth daily     Historical Provider, MD   montelukast (SINGULAIR) 10 MG tablet 10 mg nightly  01/09/18   Historical Provider, MD       Current medications:    Current Outpatient Medications   Medication Sig Dispense Refill   ??? guaiFENesin (MUCINEX) 600 MG extended release tablet Take 1,200 mg by mouth 2 times daily     ??? acetaminophen (TYLENOL) 500 MG tablet Take 500 mg by mouth every 6 hours as needed for Pain     ??? alfuzosin (UROXATRAL) 10 MG extended release tablet Take 10 mg by mouth daily     ??? rivaroxaban (XARELTO) 20 MG TABS tablet Take 20 mg by mouth     ??? atorvastatin (LIPITOR) 40 MG tablet Take 40 mg by mouth daily     ???  metoprolol succinate (TOPROL XL) 50 MG extended release tablet Take 50 mg by mouth nightly      ??? albuterol sulfate (PROAIR RESPICLICK) 123XX123 (90 Base) MCG/ACT aerosol powder inhalation 2 puffs every 6 hours as needed      ??? loratadine (CLARITIN) 10 MG capsule Take 10 mg by mouth daily      ??? montelukast (SINGULAIR) 10 MG tablet 10 mg nightly        No current facility-administered medications for this visit.        Allergies:    Allergies   Allergen Reactions   ??? Cat Hair Extract Other (See Comments)     Runny nose   ??? Dust Mite Extract Other (See Comments)     Runny nose   ??? Other    ??? Fish Allergy Nausea And Vomiting     [place] only       Problem List:  Patient Active Problem List   Diagnosis Code   ??? Thrombocytopenia ([place]) D69.6       Past Medical History:        Diagnosis Date   ??? Arthritis    ??? Atrial fibrillation ([place])    ??? Bleeding ulcer 08/2018   ??? Cancer [facility])     Prostate cancer dx March 2010- tx with radiation    ??? Colon cancer ([place]) 2000    per old chart dx with colon ca - tx with surgery only   ??? Diabetes mellitus ([place])     "was borderline- at one time was on Metformin for several yrs-  when I went to [facility] they had me stop this in 2017"   ??? Fracture     "had broken neck in Nov 2016- wore a collar for 6 weeks only"   ??? HOH (hard of hearing)     bil hearing aide   ??? Hx of blood clots     "had blood clot - it was a  blood clot in  my esophagus "   ??? Hx of fall     "last fall 2016- use cane walker and have electric chair"   ??? Hyperlipidemia    ??? Hypertension     follow with Dr Freeman Caldron at [facility]   ??? Limited range of motion (ROM) of shoulder     "right shoulder haved 10 % usage and left shoulder 60 % of usage"   ??? Neuropathy     both feet   ??? Sleep apnea     "had sleep study- tried to give me cpap but could not tolerate it "   ??? TIA (transient ischemic attack)     'in Jan 2009- trouble with speech, numbness of lip, weakness right arm - all only lasted for 20 seconds"   ??? Unsteady gait     "hx  of back problem- was on steroids for 4 yrs- since then stability issues with my back -    ??? Wears glasses     to read        Past Surgical History:        Procedure Laterality Date   ??? ABDOMEN SURGERY  2000    per old chart had right hemicolectomy "removed 2 and a half feet of colon and my appendix"   ??? COLONOSCOPY  2019   ??? ENDOSCOPY, COLON, DIAGNOSTIC      per old chart had EGD done 08/2018 and 09/2018   ??? HERNIA REPAIR      per old chart open left ing hernia 1980 and then recurrent left ing hernia repair 08/2018   ??? JOINT REPLACEMENT      per old chart total left knee 2018   ??? PACEMAKER PLACEMENT      per old chart hx of st jude pacer insertion 2008- new pacer inserted 2019   ??? SHOULDER SURGERY Left 2015    rota. cuff       Social History:    Social History     Tobacco Use   ??? Smoking status: Never Smoker   ??? Smokeless tobacco: Never Used   Substance Use Topics   ??? Alcohol use: Yes     Comment: "maybe 6 times per year" "when had prostate cancer did drink one glass red wine daily in the past  Counseling given: Not Answered      Vital Signs (Current): There were no vitals filed for this visit.                                           BP Readings from Last 3 Encounters:   02/20/19 122/72   02/23/19 128/89   11/22/18 120/77       NPO Status:                                                                                 BMI:   Wt Readings from Last 3 Encounters:   02/20/19 165 lb (74.8 kg)   11/22/18 154 lb (69.9 kg)         CBC  Lab Results   Component Value Date/Time    WBC 7.3 02/20/2019 01:00 PM    HGB 11.2 (L) 02/20/2019 01:00 PM    HCT 35.0 (L) 02/20/2019 01:00 PM    PLT 140 02/20/2019 01:00 PM     RENAL  Lab Results   Component Value Date/Time    NA 137 02/20/2019 01:00 PM    K 4.7 02/20/2019 01:00 PM    CL 103 02/20/2019 01:00 PM    CO2 21 02/20/2019 01:00 PM    BUN 22 02/20/2019 01:00 PM    CREATININE 0.9 02/20/2019 01:00 PM    GLUCOSE 132 (H) 02/20/2019 01:00 PM       COVID-19  Screening (If Applicable):   Lab Results   Component Value Date    COVID19 NOT DETECTED 02/20/2019         Anesthesia Evaluation  Patient summary reviewed and Nursing notes reviewed  Airway:         Dental:          Pulmonary:   (+) COPD (chronic bronchitis ):  sleep apnea (unable to tolerate): on noncompliant,                            ROS comment:   Non smoker    PAT Airway. Limited exam. COVID 19 precautions. Patient wearing mask  Head/Neck movement: full, Hx fx C4  History of difficult intubation:  None  Teeth: missing upper right molars   Able to lie flat         COVID 19 screening  Denies recent fever or known COVID 19 exposure.  Instructed to quarantine until surgery and report any new respiratory or fever symptoms to surgeon.   Aware COVID testing must be completed before DOS.   Recent COVID infection:   NO  Received 1st COVID vaccine 02/19/2019   COVID swab:  02/20/2019    CXR:  02/20/2019  No acute process.   Cardiovascular:    (+) hypertension (on beta blocker ):, pacemaker (implanted, 2008. New pacer ~07/2017.  St. Jude. No interrogation for review): pacemaker, dysrhythmias (chronic AF, on xarelto, last dose 02/20/2019.  ): atrial fibrillation, hyperlipidemia      ECG reviewed        Stress test reviewed  Cleared by  cardiology           ROS comment: Cardiac evaluation per Dr. Evalee Mutton.  Note in Care Everywhere. From my standpoint he is an acceptable risk and should proceed with this much-needed surgery to improve his quality of life. No further cardiac testing is needed and he can proceed with surgery. Hold Xarelto for 2 days before surgery and resume postop as soon as possible.      Stress test 09/2017  Nuclear Cardiology Conclusion:  Normal Regadenoson myocardial perfusion ??with Tc-64m sestamibi imaging.  No convincing evidence of inducible ischemia identified. Global left  ventricular systolic function was normal, with an EF of 66%. Compared to  the prior study (02/27/2010), no clinically  important changes are noted.      Stress ECG Conclusion:  Non-diagnostic pharmacologic ??Regadenoson Stress Test Non-diagnostic  changes secondary to resting ST-T abnormalities. HR Response to stress:  normal BP response to stress: hypotensive after Lexiscan. The patient  experienced no chest pain.      CP or SOB: NO   Exercise: NO     EKG:  02/20/2019  Atrial fibrillation with frequent ventricular-paced complexes and with premature ventricular or aberrantly conducted complexes   Right bundle branch block          Neuro/Psych:   (+) neuromuscular disease (acute nondisplaced type 2 dens neck fracture 2/2 fall, 12/2014, wore a C-Collar x 6 weeks. Full ROM neck. Neuropathy. CBP s/p injections. Unstable gait, uses walker.  FALL RISK ):, TIA (2009), psychiatric history (1-2 glass wine per day. Recent widower ):             ROS comment: HEENT:  HOH, bil hearing aids GI/Hepatic/Renal:   (+) PUD (08/2018), renal disease (BPH, prostate CA, 2010, treated with radiation):,          ROS comment: Diverticulitis s/p partial colectomy, 2000  Colon CA?.   Endo/Other:    (+) Diabetes (borderline diabetes.  ), blood dyscrasia (chronic thrombocytopenia, hx platelet transfusion 08/2018. Followed by Dr Franchot Mimes.  HGB: 11.2, HCT: 35.0, PLT: 140K, 02/20/2019): anemia, arthritis (knees and right shoulder.  s/p left TKA, 2018): OA., malignancy/cancer (skin and prostate).          Pt had PAT visit.        ROS comment: Right shoulder rotator cuff capsule with pain, limited ROM  S/p left rotator cuff repair, 2015    Recurrent left inguinal hernia, repaired 1980, last with mesh, 08/2018 Abdominal:           Vascular:   + DVT (esophagus ), .                                       Anesthesia Plan      general and regional     ASA 4     (Chart review only   covid -  Case cx prior for recent blood thinner use )  Induction: intravenous.    MIPS: Postoperative opioids intended and Prophylactic antiemetics administered.      Plan discussed with  CRNA.    Attending anesthesiologist reviewed and agrees with Pre Eval content            Myrtie Neither, APRN - CRNA   02/26/2019

## 2019-02-27 ENCOUNTER — Observation Stay
Admission: RE | Admit: 2019-02-27 | Discharge: 2019-02-28 | Disposition: A | Discharge status: Skilled Nursing Facility | Payer: Medicare Other | Attending: Orthopaedic Surgery | Admitting: Orthopaedic Surgery

## 2019-02-27 ENCOUNTER — Observation Stay: Admit: 2019-02-27 | Payer: MEDICARE | Primary: Family

## 2019-02-27 LAB — POCT GLUCOSE: POC Glucose: 99 MG/DL (ref 70–99)

## 2019-02-27 MED ORDER — LABETALOL HCL 5 MG/ML IV SOLN
5 | INTRAVENOUS | Status: DC | PRN
Start: 2019-02-27 — End: 2019-02-27

## 2019-02-27 MED ORDER — ONDANSETRON HCL 4 MG/2ML IJ SOLN
4 MG/2ML | INTRAMUSCULAR | Status: DC | PRN
Start: 2019-02-27 — End: 2019-02-27
  Administered 2019-02-27: 19:00:00 4 via INTRAVENOUS

## 2019-02-27 MED ORDER — LACTATED RINGERS IV SOLN
INTRAVENOUS | Status: DC
Start: 2019-02-27 — End: 2019-02-28
  Administered 2019-02-27 – 2019-02-28 (×2): via INTRAVENOUS

## 2019-02-27 MED ORDER — MEPERIDINE HCL 25 MG/ML IJ SOLN
25 MG/ML | INTRAMUSCULAR | Status: DC | PRN
Start: 2019-02-27 — End: 2019-02-27

## 2019-02-27 MED ORDER — DEXAMETHASONE SODIUM PHOSPHATE 4 MG/ML IJ SOLN
4 MG/ML | INTRAMUSCULAR | Status: DC | PRN
Start: 2019-02-27 — End: 2019-02-27
  Administered 2019-02-27: 17:00:00 4 via INTRAVENOUS

## 2019-02-27 MED ORDER — LACTATED RINGERS IV SOLN
Freq: Once | INTRAVENOUS | Status: AC
Start: 2019-02-27 — End: 2019-02-27
  Administered 2019-02-27: 15:00:00 via INTRAVENOUS

## 2019-02-27 MED ORDER — ALBUMIN HUMAN 5 % IV SOLN
5 % | INTRAVENOUS | Status: DC | PRN
Start: 2019-02-27 — End: 2019-02-27
  Administered 2019-02-27: 18:00:00 12.5 via INTRAVENOUS

## 2019-02-27 MED ORDER — SUGAMMADEX SODIUM 200 MG/2ML IV SOLN
200 MG/2ML | INTRAVENOUS | Status: DC | PRN
Start: 2019-02-27 — End: 2019-02-27
  Administered 2019-02-27: 19:00:00 200 via INTRAVENOUS

## 2019-02-27 MED ORDER — HYDRALAZINE HCL 20 MG/ML IJ SOLN
20 MG/ML | INTRAMUSCULAR | Status: DC | PRN
Start: 2019-02-27 — End: 2019-02-27

## 2019-02-27 MED ORDER — LACTATED RINGERS IV SOLN
Freq: Once | INTRAVENOUS | Status: DC
Start: 2019-02-27 — End: 2019-02-27

## 2019-02-27 MED ORDER — PROMETHAZINE HCL 25 MG/ML IJ SOLN
25 MG/ML | Freq: Once | INTRAMUSCULAR | Status: DC | PRN
Start: 2019-02-27 — End: 2019-02-27

## 2019-02-27 MED ORDER — DEXAMETHASONE SODIUM PHOSPHATE 4 MG/ML IJ SOLN
4 | INTRAMUSCULAR | Status: AC
Start: 2019-02-27 — End: 2019-02-27

## 2019-02-27 MED ORDER — BUPIVACAINE-EPINEPHRINE (PF) 0.5% -1:200000 IJ SOLN
Freq: Once | INTRAMUSCULAR | Status: AC | PRN
Start: 2019-02-27 — End: 2019-02-27
  Administered 2019-02-27: 18:00:00 10

## 2019-02-27 MED ORDER — ROCURONIUM BROMIDE 50 MG/5ML IV SOLN
50 | INTRAVENOUS | Status: AC
Start: 2019-02-27 — End: 2019-02-27

## 2019-02-27 MED ORDER — PHENYLEPHRINE HCL 10 MG/ML SOLN (MIXTURES ONLY)
10 MG/ML | Status: DC | PRN
Start: 2019-02-27 — End: 2019-02-27
  Administered 2019-02-27: 19:00:00 100 via INTRAVENOUS
  Administered 2019-02-27 (×3): 200 via INTRAVENOUS

## 2019-02-27 MED ORDER — SODIUM CHLORIDE 0.9 % IV SOLN
0.9 | INTRAVENOUS | Status: DC | PRN
Start: 2019-02-27 — End: 2019-02-27

## 2019-02-27 MED ORDER — HYDROMORPHONE HCL 2 MG/ML IJ SOLN
2 MG/ML | INTRAMUSCULAR | Status: DC | PRN
Start: 2019-02-27 — End: 2019-02-27

## 2019-02-27 MED ORDER — ROCURONIUM BROMIDE 50 MG/5ML IV SOLN
50 | INTRAVENOUS | Status: DC | PRN
Start: 2019-02-27 — End: 2019-02-27
  Administered 2019-02-27: 17:00:00 30 via INTRAVENOUS
  Administered 2019-02-27: 18:00:00 20 via INTRAVENOUS

## 2019-02-27 MED ORDER — HYDROCODONE-ACETAMINOPHEN 5-325 MG PO TABS
5-325 MG | ORAL | Status: DC | PRN
Start: 2019-02-27 — End: 2019-02-27

## 2019-02-27 MED ORDER — CEFAZOLIN SODIUM 1 G IJ SOLR
1 g | Freq: Once | INTRAMUSCULAR | Status: AC
Start: 2019-02-27 — End: 2019-02-27
  Administered 2019-02-27: 17:00:00 1 g via INTRAVENOUS

## 2019-02-27 MED ORDER — ALBUMIN HUMAN 5 % IV SOLN
5 | INTRAVENOUS | Status: AC
Start: 2019-02-27 — End: 2019-02-27

## 2019-02-27 MED ORDER — PROPOFOL 200 MG/20ML IV EMUL
200 | INTRAVENOUS | Status: AC
Start: 2019-02-27 — End: 2019-02-27

## 2019-02-27 MED ORDER — LIDOCAINE HCL (CARDIAC) PF 100 MG/5ML IV SOLN
100 | INTRAVENOUS | Status: DC | PRN
Start: 2019-02-27 — End: 2019-02-27
  Administered 2019-02-27: 17:00:00 100 via INTRAVENOUS

## 2019-02-27 MED ORDER — ONDANSETRON HCL 4 MG/2ML IJ SOLN
4 MG/2ML | Freq: Once | INTRAMUSCULAR | Status: DC | PRN
Start: 2019-02-27 — End: 2019-02-27

## 2019-02-27 MED ORDER — LIDOCAINE HCL (PF) 1 % IJ SOLN
1 | INTRAMUSCULAR | Status: AC
Start: 2019-02-27 — End: 2019-02-27

## 2019-02-27 MED ORDER — ROPIVACAINE HCL 5 MG/ML IJ SOLN
INTRAMUSCULAR | Status: AC
Start: 2019-02-27 — End: 2019-02-27
  Administered 2019-02-27: 16:00:00 20 via PERINEURAL

## 2019-02-27 MED ORDER — LIDOCAINE HCL (CARDIAC) PF 100 MG/5ML IV SOLN
100 | INTRAVENOUS | Status: AC
Start: 2019-02-27 — End: 2019-02-27

## 2019-02-27 MED ORDER — LACTATED RINGERS IV SOLN
INTRAVENOUS | Status: DC | PRN
Start: 2019-02-27 — End: 2019-02-27
  Administered 2019-02-27 (×2): via INTRAVENOUS

## 2019-02-27 MED ORDER — ONDANSETRON HCL 4 MG/2ML IJ SOLN
4 | INTRAMUSCULAR | Status: AC
Start: 2019-02-27 — End: 2019-02-27

## 2019-02-27 MED ORDER — ROPIVACAINE HCL 5 MG/ML IJ SOLN
INTRAMUSCULAR | Status: AC
Start: 2019-02-27 — End: 2019-02-27

## 2019-02-27 MED ORDER — PROPOFOL 200 MG/20ML IV EMUL
200 | INTRAVENOUS | Status: DC | PRN
Start: 2019-02-27 — End: 2019-02-27
  Administered 2019-02-27: 17:00:00 100 via INTRAVENOUS

## 2019-02-27 MED ORDER — SODIUM CHLORIDE 0.9 % IV BOLUS
0.9 % | Freq: Once | INTRAVENOUS | Status: DC | PRN
Start: 2019-02-27 — End: 2019-02-27

## 2019-02-27 MED ORDER — SUGAMMADEX SODIUM 200 MG/2ML IV SOLN
200 | INTRAVENOUS | Status: AC
Start: 2019-02-27 — End: 2019-02-27

## 2019-02-27 MED ORDER — FENTANYL CITRATE (PF) 100 MCG/2ML IJ SOLN
100 | INTRAMUSCULAR | Status: AC
Start: 2019-02-27 — End: 2019-02-27

## 2019-02-27 MED ORDER — CMHP GENTAMICIN IRRIGATION
Status: AC
Start: 2019-02-27 — End: 2019-02-27

## 2019-02-27 MED ORDER — FENTANYL CITRATE (PF) 100 MCG/2ML IJ SOLN
100 MCG/2ML | INTRAMUSCULAR | Status: DC | PRN
Start: 2019-02-27 — End: 2019-02-27

## 2019-02-27 MED ORDER — DIPHENHYDRAMINE HCL 50 MG/ML IJ SOLN
50 MG/ML | Freq: Once | INTRAMUSCULAR | Status: DC | PRN
Start: 2019-02-27 — End: 2019-02-27

## 2019-02-27 MED ORDER — HYDROCODONE-ACETAMINOPHEN 5-325 MG PO TABS
5-325 MG | Freq: Four times a day (QID) | ORAL | Status: DC | PRN
Start: 2019-02-27 — End: 2019-02-27

## 2019-02-27 MED ORDER — LIDOCAINE HCL 1 % IJ SOLN
1 | INTRAMUSCULAR | Status: AC
Start: 2019-02-27 — End: 2019-02-27

## 2019-02-27 MED ORDER — FENTANYL CITRATE (PF) 100 MCG/2ML IJ SOLN
100 MCG/2ML | INTRAMUSCULAR | Status: DC | PRN
Start: 2019-02-27 — End: 2019-02-27
  Administered 2019-02-27 (×2): 50 via INTRAVENOUS

## 2019-02-27 MED FILL — BRIDION 200 MG/2ML IV SOLN: 200 MG/2ML | INTRAVENOUS | Qty: 2

## 2019-02-27 MED FILL — ROCURONIUM BROMIDE 50 MG/5ML IV SOLN: 50 MG/5ML | INTRAVENOUS | Qty: 5

## 2019-02-27 MED FILL — ROPIVACAINE HCL 5 MG/ML IJ SOLN: INTRAMUSCULAR | Qty: 30

## 2019-02-27 MED FILL — LACTATED RINGERS IV SOLN: INTRAVENOUS | Qty: 1000

## 2019-02-27 MED FILL — LIDOCAINE HCL (CARDIAC) PF 100 MG/5ML IV SOLN: 100 MG/5ML | INTRAVENOUS | Qty: 5

## 2019-02-27 MED FILL — DIPRIVAN 200 MG/20ML IV EMUL: 200 MG/20ML | INTRAVENOUS | Qty: 20

## 2019-02-27 MED FILL — XYLOCAINE-MPF 1 % IJ SOLN: 1 % | INTRAMUSCULAR | Qty: 5

## 2019-02-27 MED FILL — LIDOCAINE HCL 1 % IJ SOLN: 1 % | INTRAMUSCULAR | Qty: 20

## 2019-02-27 MED FILL — ONDANSETRON HCL 4 MG/2ML IJ SOLN: 4 MG/2ML | INTRAMUSCULAR | Qty: 2

## 2019-02-27 MED FILL — CMHP GENTAMICIN IRRIGATION: Qty: 1000

## 2019-02-27 MED FILL — SODIUM CHLORIDE 0.9 % IV SOLN: 0.9 % | INTRAVENOUS | Qty: 500

## 2019-02-27 MED FILL — CEFAZOLIN SODIUM 1 G IJ SOLR: 1 g | INTRAMUSCULAR | Qty: 1000

## 2019-02-27 MED FILL — DEXAMETHASONE SODIUM PHOSPHATE 4 MG/ML IJ SOLN: 4 mg/mL | INTRAMUSCULAR | Qty: 1

## 2019-02-27 MED FILL — ALBUTEIN 5 % IV SOLN: 5 % | INTRAVENOUS | Qty: 250

## 2019-02-27 MED FILL — FENTANYL CITRATE (PF) 100 MCG/2ML IJ SOLN: 100 MCG/2ML | INTRAMUSCULAR | Qty: 2

## 2019-02-27 NOTE — Brief Op Note (Signed)
Department of Orthopedic Surgery  Brief Operative Report                                                                                            BRIEF OPERATIVE NOTE    Patient name    Dalton Warner  MRN     [id number]    Date of Procedure  02/27/2019      Pre-operative Diagnosis: 1.  Right shoulder arthritis    Post-operative Diagnosis:  Same     Procedure:    1. Right reverse arthroplasty    Surgeon:      Avel Sensor, MD      Assistant:    Camille Bal PA-C      Anesthesia:   GETA    Estimated blood loss:   300 ml    Specimens:     None taken    Complications:    None    Condition:     Stable      See dictated operative report for full details. Patient tolerated the procedure well and returned to recovery in stable condition.  Postoperative orders and instructions have been provided.    Avel Sensor, MD

## 2019-02-27 NOTE — Anesthesia Pre-Procedure Evaluation (Signed)
Department of Anesthesiology  Preprocedure Note       Name:  Dalton Warner   Age:  86 y.o.  DOB:  18-Apr-1930                                          MRN:  [id number]         Date:  02/27/2019      Surgeon: Juliann Mule):  Avel Sensor, MD    Procedure: Procedure(s):  RIGHT REVERSE SHOULDER TOTAL ARTHROPLASTY REVERSE    Medications prior to admission:   Prior to Admission medications    Medication Sig Start Date End Date Taking? Authorizing Provider   guaiFENesin (MUCINEX) 600 MG extended release tablet Take 1,200 mg by mouth 2 times daily   Yes Historical Provider, MD   acetaminophen (TYLENOL) 500 MG tablet Take 500 mg by mouth every 6 hours as needed for Pain   Yes Historical Provider, MD   alfuzosin (UROXATRAL) 10 MG extended release tablet Take 10 mg by mouth daily   Yes Historical Provider, MD   atorvastatin (LIPITOR) 40 MG tablet Take 40 mg by mouth daily   Yes Historical Provider, MD   metoprolol succinate (TOPROL XL) 50 MG extended release tablet Take 50 mg by mouth nightly    Yes Historical Provider, MD   loratadine (CLARITIN) 10 MG capsule Take 10 mg by mouth daily    Yes Historical Provider, MD   montelukast (SINGULAIR) 10 MG tablet 10 mg nightly  01/09/18  Yes Historical Provider, MD   rivaroxaban (XARELTO) 20 MG TABS tablet Take 20 mg by mouth    Historical Provider, MD   albuterol sulfate (PROAIR RESPICLICK) 123XX123 (90 Base) MCG/ACT aerosol powder inhalation 2 puffs every 6 hours as needed     Historical Provider, MD       Current medications:    Current Facility-Administered Medications   Medication Dose Route Frequency Provider Last Rate Last Admin   ??? 0.9 % sodium chloride infusion   Intravenous PRN Avel Sensor, MD       ??? meperidine (DEMEROL) injection 12.5 mg  12.5 mg Intravenous Q5 Min PRN Atha Starks, MD       ??? fentaNYL (SUBLIMAZE) injection 50 mcg  50 mcg Intravenous Q5 Min PRN Atha Starks, MD       ??? HYDROmorphone (DILAUDID) injection 0.5 mg  0.5 mg Intravenous Q5 Min PRN  Atha Starks, MD       ??? HYDROcodone-acetaminophen (NORCO) 5-325 MG per tablet 1 tablet  1 tablet Oral PRN Atha Starks, MD        Or   ??? HYDROcodone-acetaminophen (NORCO) 5-325 MG per tablet 2 tablet  2 tablet Oral Q6H PRN Atha Starks, MD       ??? ondansetron [facility]) injection 4 mg  4 mg Intravenous Once PRN Atha Starks, MD       ??? promethazine (PHENERGAN) injection 6.25 mg  6.25 mg Intramuscular Once PRN Atha Starks, MD       ??? 0.9 % sodium chloride bolus  500 mL Intravenous Once PRN Atha Starks, MD       ??? diphenhydrAMINE (BENADRYL) injection 12.5 mg  12.5 mg Intravenous Once PRN Atha Starks, MD       ??? labetalol (NORMODYNE;TRANDATE) injection 5 mg  5 mg Intravenous Q10 Min PRN Atha Starks,  MD       ??? hydrALAZINE (APRESOLINE) injection 5 mg  5 mg Intravenous Q10 Min PRN Atha Starks, MD           Allergies:    Allergies   Allergen Reactions   ??? Cat Hair Extract Other (See Comments)     Runny nose   ??? Dust Mite Extract Other (See Comments)     Runny nose   ??? Other    ??? Fish Allergy Nausea And Vomiting     [place] only       Problem List:    Patient Active Problem List   Diagnosis Code   ??? Thrombocytopenia ([place]) D69.6       Past Medical History:        Diagnosis Date   ??? Arthritis    ??? Atrial fibrillation ([place])    ??? Bleeding ulcer 08/2018   ??? Cancer [facility])     Prostate cancer dx March 2010- tx with radiation    ??? Colon cancer ([place]) 2000    per old chart dx with colon ca - tx with surgery only   ??? Diabetes mellitus ([place])     "was borderline- at one time was on Metformin for several yrs-  when I went to [facility] they had me stop this in 2017"   ??? Fracture     "had broken neck in Nov 2016- wore a collar for 6 weeks only"   ??? HOH (hard of hearing)     bil hearing aide   ??? Hx of blood clots     "had blood clot - it was a  blood clot in  my esophagus "   ??? Hx of fall     "last fall 2016- use cane walker and have electric chair"   ??? Hyperlipidemia    ??? Hypertension     follow with Dr Freeman Caldron  at [facility]   ??? Limited range of motion (ROM) of shoulder     "right shoulder haved 10 % usage and left shoulder 60 % of usage"   ??? Neuropathy     both feet   ??? Sleep apnea     "had sleep study- tried to give me cpap but could not tolerate it "   ??? TIA (transient ischemic attack)     'in Jan 2009- trouble with speech, numbness of lip, weakness right arm - all only lasted for 20 seconds"   ??? Unsteady gait     "hx of back problem- was on steroids for 4 yrs- since then stability issues with my back -    ??? Wears glasses     to read        Past Surgical History:        Procedure Laterality Date   ??? ABDOMEN SURGERY  2000    per old chart had right hemicolectomy "removed 2 and a half feet of colon and my appendix"   ??? COLONOSCOPY  2019   ??? ENDOSCOPY, COLON, DIAGNOSTIC      per old chart had EGD done 08/2018 and 09/2018   ??? HERNIA REPAIR      per old chart open left ing hernia 1980 and then recurrent left ing hernia repair 08/2018   ??? JOINT REPLACEMENT      per old chart total left knee 2018   ??? PACEMAKER PLACEMENT      per old chart hx of st jude pacer insertion 2008- new pacer inserted 2019   ???  SHOULDER SURGERY Left 2015    rota. cuff       Social History:    Social History     Tobacco Use   ??? Smoking status: Never Smoker   ??? Smokeless tobacco: Never Used   Substance Use Topics   ??? Alcohol use: Yes     Comment: "maybe 6 times per year" "when had prostate cancer did drink one glass red wine daily in the past                                Counseling given: Not Answered      Vital Signs (Current):   Vitals:    02/27/19 0920 02/27/19 1123 02/27/19 1125   BP: (!) 137/91 (!) 145/85 (!) 140/82   Pulse: 71 80 67   Resp: 18     Temp: 37.4 ??C (99.3 ??F)     TempSrc: Temporal     SpO2: 98% 97% (!) 88%   Weight: 165 lb (74.8 kg)     Height: 5\' 7"  (1.702 m)                                                BP Readings from Last 3 Encounters:   02/27/19 (!) 140/82   02/20/19 122/72   02/23/19 128/89       NPO Status: Time of last liquid  consumption: 1900                        Time of last solid consumption: 1700                        Date of last liquid consumption: 02/26/19                        Date of last solid food consumption: 02/26/19    BMI:   Wt Readings from Last 3 Encounters:   02/27/19 165 lb (74.8 kg)   02/20/19 165 lb (74.8 kg)   11/22/18 154 lb (69.9 kg)         CBC  Lab Results   Component Value Date/Time    WBC 7.3 02/20/2019 01:00 PM    HGB 11.2 (L) 02/20/2019 01:00 PM    HCT 35.0 (L) 02/20/2019 01:00 PM    PLT 140 02/20/2019 01:00 PM     RENAL  Lab Results   Component Value Date/Time    NA 137 02/20/2019 01:00 PM    K 4.7 02/20/2019 01:00 PM    CL 103 02/20/2019 01:00 PM    CO2 21 02/20/2019 01:00 PM    BUN 22 02/20/2019 01:00 PM    CREATININE 0.9 02/20/2019 01:00 PM    GLUCOSE 132 (H) 02/20/2019 01:00 PM       COVID-19 Screening (If Applicable):   Lab Results   Component Value Date    COVID19 NOT DETECTED 02/20/2019         Anesthesia Evaluation  Patient summary reviewed and Nursing notes reviewed no history of anesthetic complications:   Airway: Mallampati: IV  TM distance: <3 FB   Neck ROM: full  Mouth opening: > = 3 FB Dental: normal exam         Pulmonary:   (+)  COPD (chronic bronchitis ):  sleep apnea (unable to tolerate): on noncompliant,  decreased breath sounds,                            ROS comment:   Non smoker    PAT Airway. Limited exam. COVID 19 precautions. Patient wearing mask  Head/Neck movement: full, Hx fx C4  History of difficult intubation:  None  Teeth: missing upper right molars   Able to lie flat         COVID 19 screening  Denies recent fever or known COVID 19 exposure.  Instructed to quarantine until surgery and report any new respiratory or fever symptoms to surgeon.   Aware COVID testing must be completed before DOS.   Recent COVID infection:   NO  Received 1st COVID vaccine 02/19/2019   COVID swab:  02/20/2019    CXR:  02/20/2019  No acute process.   Cardiovascular:  Exercise tolerance: poor (<4  METS),   (+) hypertension (on beta blocker ):, pacemaker (implanted, 2008. New pacer ~07/2017.  [employer]. Jude. No interrogation for review): pacemaker, dysrhythmias (chronic AF, on xarelto, last dose 02/20/2019.  ): atrial fibrillation, hyperlipidemia      ECG reviewed        Stress test reviewed  Cleared by cardiology           ROS comment: Cardiac evaluation per Dr. Evalee Mutton.  Note in Care Everywhere. From my standpoint he is an acceptable risk and should proceed with this much-needed surgery to improve his quality of life. No further cardiac testing is needed and he can proceed with surgery. Hold Xarelto for 2 days before surgery and resume postop as soon as possible.      Stress test 09/2017  Nuclear Cardiology Conclusion:  Normal Regadenoson myocardial perfusion ??with Tc-68m sestamibi imaging.  No convincing evidence of inducible ischemia identified. Global left  ventricular systolic function was normal, with an EF of 66%. Compared to  the prior study (02/27/2010), no clinically important changes are noted.      Stress ECG Conclusion:  Non-diagnostic pharmacologic ??Regadenoson Stress Test Non-diagnostic  changes secondary to resting ST-T abnormalities. HR Response to stress:  normal BP response to stress: hypotensive after Lexiscan. The patient  experienced no chest pain.      CP or SOB: NO   Exercise: NO     EKG:  02/20/2019  Atrial fibrillation with frequent ventricular-paced complexes and with premature ventricular or aberrantly conducted complexes   Right bundle branch block         PE comment: paced   Neuro/Psych:   (+) neuromuscular disease (acute nondisplaced type 2 dens neck fracture 2/2 fall, 12/2014, wore a C-Collar x 6 weeks. Full ROM neck. Neuropathy. CBP s/p injections. Unstable gait, uses walker.  FALL RISK ):, TIA (2009), psychiatric history (1-2 glass wine per day. Recent widower ):             ROS comment: HEENT:  HOH, bil hearing aids GI/Hepatic/Renal:   (+) PUD (08/2018), renal disease  (BPH, prostate CA, 2010, treated with radiation):,          ROS comment: Diverticulitis s/p partial colectomy, 2000  Colon CA?.   Endo/Other:    (+) Diabetes (borderline diabetes.  ), blood dyscrasia (chronic thrombocytopenia, hx platelet transfusion 08/2018. Followed by Dr Franchot Mimes.  HGB: 11.2, HCT: 35.0, PLT: 140K, 02/20/2019): anemia, arthritis (knees and right shoulder.  s/p left TKA, 2018): OA.,  malignancy/cancer (skin and prostate).          Pt had PAT visit.        ROS comment: Right shoulder rotator cuff capsule with pain, limited ROM  S/p left rotator cuff repair, 2015    Recurrent left inguinal hernia, repaired 1980, last with mesh, 08/2018 Abdominal:           Vascular:   + DVT (esophagus ), .                                     Anesthesia Plan      general and regional     ASA 4     (Chart review only   covid -  Case cx prior for recent blood thinner use )  Induction: intravenous.    MIPS: Postoperative opioids intended and Prophylactic antiemetics administered.  Anesthetic plan and risks discussed with patient.      Plan discussed with CRNA.    Attending anesthesiologist reviewed and agrees with Pre Eval content        Pre Anesthesia Evaluation complete. Anesthesia plan, risks, benefits, alternatives, and personnel discussed with patient and/or legal guardian. Patient and/or legal guardian verbalized an understanding and agreed to proceed. Anesthesia plan discussed with care team members and agreed upon.  Crissie Reese, APRN - CRNA  02/27/2019

## 2019-02-27 NOTE — Progress Notes (Signed)
1422: Patient arrived to PACU from OR. Monitors applied, alarms on. VSS. Patient awake, alert and oriented. Patient unable to move fingers or have any sensation to right upper ext at this time. Surgical dressing is clean, dry and intact. Small drainage noted on dressing, outlined in marker. Report obtained from [place] RN and Erma Heritage. CRNA.   1430: Matt from OR at bedside to apply sling and ice machine.   1440: Patient tolerating ice chips at this time.  1500: Patient tolerating water at this time.  1510: Patient states able to feel sensation in palm of hand.   1523: Radiology at bedside for post op xray.   1545: Report called to Dierdre Highman for room 1119.   1555: Patient transferred to room 1119 on cart by transport staff.

## 2019-02-27 NOTE — Anesthesia Post-Procedure Evaluation (Signed)
Department of Anesthesiology  Postprocedure Note    Patient: Dalton Warner  MRN: [id number]  [place]: May 14, 1930  Date of evaluation: 02/27/2019  Time:  5:55 PM     Procedure Summary     Date: 02/27/19 Room / Location: [place] 06 / V [facility]    Anesthesia Start: 1132 Anesthesia Stop: [id number]    Procedure: RIGHT REVERSE SHOULDER TOTAL ARTHROPLASTY REVERSE (Right Shoulder) Diagnosis: (SPRAIN OF RIGHT ROTATOR CUFF CAPSULE , INITIAL ENCOUNTER , PRIMARY OA OF RIGHT SHOULDER , RIGHT SHOULDER PAIN , UNSPECIFIED CHRONICITY)    Surgeons: Avel Sensor, MD Responsible Provider: Atha Starks, MD    Anesthesia Type: general, regional ASA Status: 4          Anesthesia Type: general, regional    Aldrete Phase I: Aldrete Score: 10    Aldrete Phase II:      Last vitals: Reviewed and per EMR flowsheets.       Anesthesia Post Evaluation    Patient location during evaluation: PACU  Patient participation: complete - patient participated  Level of consciousness: awake  Pain score: 3  Airway patency: patent  Nausea & Vomiting: no vomiting and no nausea  Complications: no  Cardiovascular status: blood pressure returned to baseline and hemodynamically stable  Respiratory status: acceptable  Hydration status: stable

## 2019-02-27 NOTE — H&P (Addendum)
HISTORY AND PHYSICAL    Patient Name:   Dalton Warner   (25-Aug-1930)  MRN     [id number]   Today's date:    02/27/2019     CHIEF COMPLAINT:  Right Shoulder Pain    History Obtained From:  patient    HISTORY OF PRESENT ILLNESS:      The patient is a 86 y.o. male  who presents with right shoulder pain    Here for right shoulder replacement      Past Medical History         Diagnosis Date   ??? Arthritis    ??? Atrial fibrillation ([place])    ??? Bleeding ulcer 08/2018   ??? Cancer [facility])     Prostate cancer dx March 2010- tx with radiation    ??? Colon cancer ([place]) 2000    per old chart dx with colon ca - tx with surgery only   ??? Diabetes mellitus ([place])     "was borderline- at one time was on Metformin for several yrs-  when I went to [facility] they had me stop this in 2017"   ??? Fracture     "had broken neck in Nov 2016- wore a collar for 6 weeks only"   ??? HOH (hard of hearing)     bil hearing aide   ??? Hx of blood clots     "had blood clot - it was a  blood clot in  my esophagus "   ??? Hx of fall     "last fall 2016- use cane walker and have electric chair"   ??? Hyperlipidemia    ??? Hypertension     follow with Dr Freeman Caldron at [facility]   ??? Limited range of motion (ROM) of shoulder     "right shoulder haved 10 % usage and left shoulder 60 % of usage"   ??? Neuropathy     both feet   ??? Sleep apnea     "had sleep study- tried to give me cpap but could not tolerate it "   ??? TIA (transient ischemic attack)     'in Jan 2009- trouble with speech, numbness of lip, weakness right arm - all only lasted for 20 seconds"   ??? Unsteady gait     "hx of back problem- was on steroids for 4 yrs- since then stability issues with my back -    ??? Wears glasses     to read        Past Surgical History         Procedure Laterality Date   ??? ABDOMEN SURGERY  2000    per old chart had right hemicolectomy "removed 2 and a half  feet of colon and my appendix"   ??? COLONOSCOPY  2019   ??? ENDOSCOPY, COLON, DIAGNOSTIC      per old chart had EGD done 08/2018 and 09/2018   ??? HERNIA REPAIR      per old chart open left ing hernia 1980 and then recurrent left ing hernia repair 08/2018   ??? JOINT REPLACEMENT      per old chart total left knee 2018   ??? PACEMAKER PLACEMENT      per old chart hx of st jude pacer insertion 2008- new pacer inserted 2019   ??? SHOULDER SURGERY Left 2015    rota. cuff       Medications Prior to Admission:     Prior to Admission medications    Medication Sig  Start Date End Date Taking? Authorizing Provider   guaiFENesin (MUCINEX) 600 MG extended release tablet Take 1,200 mg by mouth 2 times daily    Historical Provider, MD   acetaminophen (TYLENOL) 500 MG tablet Take 500 mg by mouth every 6 hours as needed for Pain    Historical Provider, MD   alfuzosin (UROXATRAL) 10 MG extended release tablet Take 10 mg by mouth daily    Historical Provider, MD   rivaroxaban (XARELTO) 20 MG TABS tablet Take 20 mg by mouth    Historical Provider, MD   atorvastatin (LIPITOR) 40 MG tablet Take 40 mg by mouth daily    Historical Provider, MD   metoprolol succinate (TOPROL XL) 50 MG extended release tablet Take 50 mg by mouth nightly     Historical Provider, MD   albuterol sulfate (PROAIR RESPICLICK) 123XX123 (90 Base) MCG/ACT aerosol powder inhalation 2 puffs every 6 hours as needed     Historical Provider, MD   loratadine (CLARITIN) 10 MG capsule Take 10 mg by mouth daily     Historical Provider, MD   montelukast (SINGULAIR) 10 MG tablet 10 mg nightly  01/09/18   Historical Provider, MD       Allergies     Cat hair extract, Dust mite extract, Other, and Fish allergy    Social History   TOBACCO:   reports that he has never smoked. He has never used smokeless tobacco.  ETOH:   reports current alcohol use.  Patient currently lives [facility]    Family History         Problem Relation Age of Onset   ??? Cancer Mother         lung cancer   ??? Diabetes Mother     ??? Diabetes Sister    ??? Cancer Brother    ??? High Blood Pressure Brother        Review of Systems   Constitutional:  No weight loss, no night sweats  Eyes:  No visual changes  ENT:  No nasal drainage or ear pain  CV:  No chest pain  PULM:  No cough, no shortness of breath  GI:  No nausea, vomiting, diarrhea  GU:  No dysuria  Musc:  No weakness of arms or legs  Neuro: No numbness, tingling or parethesias  Skin:  No rashes  Psych: No depression symptoms    Physical Exam:    Vitals: There were no vitals taken for this visit.,  There is no height or weight on file to calculate BMI.   General appearance: alert, appears stated age and cooperative  Skin: Skin color, texture, turgor normal. No rashes or lesions  HEENT: Head: Normocephalic, no lesions, without obvious abnormality.  Neck: no adenopathy, no carotid bruit, no JVD, supple, symmetrical, trachea midline and thyroid not enlarged, symmetric, no tenderness/mass/nodules  Lungs: clear to auscultation bilaterally  Heart: irregularly irregular rhythm  Abdomen: soft, non-tender; bowel sounds normal; no masses,  no organomegaly  Extremities: See Office notes, No change  Neurologic: Mental status: Alert, oriented, thought content appropriate    Labs     CBC: No results for input(s): WBC, HGB, PLT in the last 72 hours.  BMP:  No results for input(s): NA, K, CL, CO2, BUN, CREATININE, GLUCOSE in the last 72 hours.  INR: No results for input(s): INR in the last 72 hours.      X-ray view   Imaging Review    Narrative   EXAMINATION:   THREE XRAY VIEWS OF  THE RIGHT SHOULDER   ??   02/20/2019 2:46 pm   ??   COMPARISON:   None.   ??   HISTORY:   ORDERING SYSTEM PROVIDED HISTORY: Preoperative testing   TECHNOLOGIST PROVIDED HISTORY:   Order reads AP and lateral operative shoulder( for right shoulder surg   02/20/2019   Reason for Exam: Preoperative testing   Acuity: Acute   Type of Exam: Initial   ??   FINDINGS:   Anatomic alignment. ??No fracture. ??No destructive bony abnormality. ??Severe    degenerative changes in the glenohumeral joint and AC joint.   ??   ??   Impression   1. No acute bony abnormality   2. Severe degenerative changes in the glenohumeral joint and AC joint   ??         Assessment and Plan     Patient Active Problem List   Diagnosis Code   ??? Thrombocytopenia (H[facility]) D69.6       Impression:   1.  Right shoulder arthritis    Impression:   1.  Right shoulder replacement        Avel Sensor, MD

## 2019-02-27 NOTE — Anesthesia Procedure Notes (Signed)
Peripheral Block    Patient location during procedure: procedure area  Start time: 02/27/2019 11:12 AM  End time: 02/27/2019 11:26 AM  Staffing  Performed: resident/CRNA   Resident/CRNA: Crissie Reese, APRN - CRNA  Preanesthetic Checklist  Completed: patient identified, IV checked, site marked, risks and benefits discussed, surgical consent, monitors and equipment checked, pre-op evaluation, timeout performed, anesthesia consent given, oxygen available and patient being monitored  Peripheral Block  Patient position: supine  Prep: ChloraPrep and site prepped and draped  Patient monitoring: cardiac monitor, continuous pulse ox, continuous capnometry, frequent blood pressure checks and IV access  Block type: Brachial plexus  Laterality: right  Injection technique: single-shot  Guidance: ultrasound guided  Local infiltration: ropivacaine  Infiltration strength: 0.5 %  Interscalene  Provider prep: mask and sterile gloves  Local infiltration: ropivacaine  Needle  Needle type: combined needle/nerve stimulator   Needle gauge: 21 G  Needle length: 8 cm  Needle localization: ultrasound guidance  Needle insertion depth: 4 cm  Assessment  Injection assessment: negative aspiration for heme, no paresthesia on injection and local visualized surrounding nerve on ultrasound  Slow fractionated injection: yes  Hemodynamics: stable  Medications Administered  Ropivacaine (NAROPIN) injection 0.5%, 20 mL  Reason for block: procedure for pain and at surgeon's request

## 2019-02-28 LAB — TYPE AND SCREEN
ABO/Rh: A POS
ABO/Rh: A POS
Antibody Screen: NEGATIVE
Antibody Screen: NEGATIVE
Unit Divison: 0

## 2019-02-28 LAB — CBC WITH AUTO DIFFERENTIAL
Bands Absolute: 0.5 10*3/uL
Hematocrit: 26.8 % — ABNORMAL LOW (ref 42–52)
Hemoglobin: 8.6 GM/DL — ABNORMAL LOW (ref 13.5–18.0)
Lymphocytes %: 9 % — ABNORMAL LOW (ref 24–44)
Lymphocytes Absolute: 1.1 K/CU MM
MCH: 29.2 PG (ref 27–31)
MCHC: 32.1 % (ref 32.0–36.0)
MCV: 90.8 FL (ref 78–100)
MPV: 11.3 FL — ABNORMAL HIGH (ref 7.5–11.1)
Monocytes %: 16 % — ABNORMAL HIGH (ref 0–4)
Monocytes Absolute: 2 10*3/uL
Platelets: 62 10*3/uL — ABNORMAL LOW (ref 140–440)
RBC: 2.95 10*6/uL — ABNORMAL LOW (ref 4.6–6.2)
RDW: 19.3 % — ABNORMAL HIGH (ref 11.7–14.9)
Segs Absolute: 8.9 10*3/uL
Segs Relative: 71 % — ABNORMAL HIGH (ref 36–66)
WBC: 12.5 10*3/uL — ABNORMAL HIGH (ref 4.0–10.5)

## 2019-02-28 LAB — BASIC METABOLIC PANEL W/ REFLEX TO MG FOR LOW K
Anion Gap: 9 (ref 4–16)
BUN: 24 MG/DL — ABNORMAL HIGH (ref 6–23)
CO2: 23 MMOL/L (ref 21–32)
Calcium: 7.8 MG/DL — ABNORMAL LOW (ref 8.3–10.6)
Chloride: 103 mMol/L (ref 99–110)
Creatinine: 0.9 MG/DL (ref 0.9–1.3)
GFR African American: >60 mL/min/1.73m2 (ref >60)
GFR Non-African American: >60 mL/min/{1.73_m2} (ref >60)
Glucose: 166 MG/DL — ABNORMAL HIGH (ref 70–99)
Potassium: 4.8 MMOL/L (ref 3.5–5.1)
Sodium: 135 MMOL/L (ref 135–145)

## 2019-02-28 LAB — COVID-19: SARS-CoV-2, NAAT: NOT DETECTED

## 2019-02-28 MED ORDER — METOPROLOL SUCCINATE ER 50 MG PO TB24
50 MG | Freq: Every evening | ORAL | Status: DC
Start: 2019-02-28 — End: 2019-02-28
  Administered 2019-02-28: 04:00:00 50 mg via ORAL

## 2019-02-28 MED ORDER — HYDROMORPHONE HCL 1 MG/ML IJ SOLN
1 MG/ML | INTRAMUSCULAR | Status: DC | PRN
Start: 2019-02-28 — End: 2019-02-28

## 2019-02-28 MED ORDER — CEFAZOLIN 2000 MG IN D5W 100 ML IVPB
Freq: Three times a day (TID) | Status: AC
Start: 2019-02-28 — End: 2019-02-28
  Administered 2019-02-28 (×2): 2 g via INTRAVENOUS

## 2019-02-28 MED ORDER — OXYCODONE HCL 10 MG PO TABS
10 MG | ORAL | Status: DC | PRN
Start: 2019-02-28 — End: 2019-02-28

## 2019-02-28 MED ORDER — GUAIFENESIN ER 600 MG PO TB12
600 MG | Freq: Two times a day (BID) | ORAL | Status: DC
Start: 2019-02-28 — End: 2019-02-28
  Administered 2019-02-28 (×2): 1200 mg via ORAL

## 2019-02-28 MED ORDER — OXYCODONE-ACETAMINOPHEN 5-325 MG PO TABS
5-325 MG | ORAL_TABLET | Freq: Four times a day (QID) | ORAL | 0 refills | Status: DC | PRN
Start: 2019-02-28 — End: 2019-03-05

## 2019-02-28 MED ORDER — TAMSULOSIN HCL 0.4 MG PO CAPS
0.4 MG | Freq: Every day | ORAL | Status: DC
Start: 2019-02-28 — End: 2019-02-28
  Administered 2019-02-28: 14:00:00 0.4 mg via ORAL

## 2019-02-28 MED ORDER — ACETAMINOPHEN 325 MG PO TABS
325 MG | Freq: Four times a day (QID) | ORAL | Status: DC
Start: 2019-02-28 — End: 2019-02-28
  Administered 2019-02-28 (×3): 650 mg via ORAL

## 2019-02-28 MED ORDER — OXYCODONE HCL 5 MG PO TABS
5 MG | ORAL | Status: DC | PRN
Start: 2019-02-28 — End: 2019-02-28
  Administered 2019-02-28: 19:00:00 5 mg via ORAL

## 2019-02-28 MED ORDER — RIVAROXABAN 20 MG PO TABS
20 MG | Freq: Every day | ORAL | Status: DC
Start: 2019-02-28 — End: 2019-02-28

## 2019-02-28 MED ORDER — NORMAL SALINE FLUSH 0.9 % IV SOLN
0.9 | INTRAVENOUS | Status: DC | PRN
Start: 2019-02-28 — End: 2019-02-28

## 2019-02-28 MED ORDER — MAGNESIUM HYDROXIDE 400 MG/5ML PO SUSP
400 MG/5ML | Freq: Every day | ORAL | Status: DC | PRN
Start: 2019-02-28 — End: 2019-02-28

## 2019-02-28 MED ORDER — SENNA-DOCUSATE SODIUM 8.6-50 MG PO TABS
Freq: Two times a day (BID) | ORAL | Status: DC
Start: 2019-02-28 — End: 2019-02-28
  Administered 2019-02-28 (×2): 1 via ORAL

## 2019-02-28 MED ORDER — MONTELUKAST SODIUM 10 MG PO TABS
10 MG | Freq: Every evening | ORAL | Status: DC
Start: 2019-02-28 — End: 2019-02-28
  Administered 2019-02-28: 04:00:00 10 mg via ORAL

## 2019-02-28 MED ORDER — CETIRIZINE HCL 10 MG PO TABS
10 MG | Freq: Every day | ORAL | Status: DC
Start: 2019-02-28 — End: 2019-02-28

## 2019-02-28 MED ORDER — ATORVASTATIN CALCIUM 40 MG PO TABS
40 MG | Freq: Every day | ORAL | Status: DC
Start: 2019-02-28 — End: 2019-02-28

## 2019-02-28 MED ORDER — NORMAL SALINE FLUSH 0.9 % IV SOLN
0.9 | Freq: Two times a day (BID) | INTRAVENOUS | Status: DC
Start: 2019-02-28 — End: 2019-02-28
  Administered 2019-02-28 (×2): 10 mL via INTRAVENOUS

## 2019-02-28 MED FILL — TAMSULOSIN HCL 0.4 MG PO CAPS: 0.4 mg | ORAL | Qty: 1

## 2019-02-28 MED FILL — PAIN & FEVER 325 MG PO TABS: 325 mg | ORAL | Qty: 2

## 2019-02-28 MED FILL — DOK PLUS 50-8.6 MG PO TABS: 50-8.6 mg | ORAL | Qty: 1

## 2019-02-28 MED FILL — CEFAZOLIN 2000 MG IN D5W 100 ML IVPB: Qty: 2

## 2019-02-28 MED FILL — MUCINEX 600 MG PO TB12: 600 mg | ORAL | Qty: 2

## 2019-02-28 MED FILL — OXYCODONE HCL 5 MG PO TABS: 5 mg | ORAL | Qty: 1

## 2019-02-28 MED FILL — NORMAL SALINE FLUSH 0.9 % IV SOLN: 0.9 % | INTRAVENOUS | Qty: 10

## 2019-02-28 MED FILL — LIPITOR 40 MG PO TABS: 40 mg | ORAL | Qty: 1

## 2019-02-28 MED FILL — MONTELUKAST SODIUM 10 MG PO TABS: 10 mg | ORAL | Qty: 1

## 2019-02-28 MED FILL — CETIRIZINE HCL 10 MG PO TABS: 10 mg | ORAL | Qty: 1

## 2019-02-28 MED FILL — METOPROLOL SUCCINATE ER 50 MG PO TB24: 50 mg | ORAL | Qty: 1

## 2019-02-28 NOTE — Progress Notes (Signed)
Dalton Warner ([birth year])    Daily Progress Note-  Avel Sensor, MD                     Today's Date:   02/28/2019          Subjective:     Awake and alert this morning  Pain level controlled rates 4/10  Denies chest pain or shortness of breath.      Objective:     Patient Vitals for the past 6 hrs:   BP Temp Pulse Resp SpO2   02/28/19 0334 120/65 98.1 ??F (36.7 ??C) 86 18 98 %     I/O last 3 completed shifts:  In: 1500 [I.V.:1200; Blood:300]  Out: 550 [Blood:550]  DRAIN/TUBE OUTPUT:  Closed/Suction Drain Right Shoulder Accordion-Output (ml): 30 ml    Physical Exam:       General: alert, appears stated age and cooperative   Right Shoulder: Sling intact  Dressing clean and dry.  Good radial pulse and moving fingers/wrist     DVT Exam: No evidence of DVT seen on physical exam.       Data Review  CBC:   Recent Labs     02/28/19  0512   WBC 12.5*   HGB 8.6*   PLT 62*         Meds:   ??? tamsulosin  0.4 mg Oral Daily   ??? atorvastatin  40 mg Oral Daily   ??? guaiFENesin  1,200 mg Oral BID   ??? cetirizine  10 mg Oral Daily   ??? metoprolol succinate  50 mg Oral Nightly   ??? montelukast  10 mg Oral Nightly   ??? rivaroxaban  20 mg Oral Daily   ??? sodium chloride flush  10 mL Intravenous 2 times per day   ??? sennosides-docusate sodium  1 tablet Oral BID   ??? ceFAZolin (ANCEF) IVPB  2 g Intravenous Q8H   ??? acetaminophen  650 mg Oral Q6H                                                   Assessment:      1.  Right Reverse shoulder arthroplasty 02/27/19    Plan:      Postoperative Day #  1    1:  Keep in sling.  2:  PT work ROM   -active and passive range of motion permitted.  No restrictions  3.  Acute blood loss anemia, not a complication  4.  Resume Xarelto  5:  Continue Pain Control  6.  Discharge planning.   [facility]      Avel Sensor, MD  02/28/2019

## 2019-02-28 NOTE — Consults (Signed)
[place] [facility] ACUTE CARE PHYSICAL THERAPY EVALUATION  Dalton Warner, 11-01-1930, 1119/1119-A, 02/28/2019    History  HOPI:  The encounter diagnosis was History of arthroplasty of right shoulder.  Patient  has a past medical history of Arthritis, Atrial fibrillation (HCC), Bleeding ulcer, Cancer (HCC), Colon cancer (HCC), Diabetes mellitus (HCC), Fracture, HOH (hard of hearing), Hx of blood clots, Hx of fall, Hyperlipidemia, Hypertension, Limited range of motion (ROM) of shoulder, Neuropathy, Sleep apnea, TIA (transient ischemic attack), Unsteady gait, and Wears glasses.  Patient  has a past surgical history that includes shoulder surgery (Left, 2015); Colonoscopy (2019); pacemaker placement; hernia repair; Endoscopy, colon, diagnostic; Abdomen surgery (2000); and joint replacement.    Subjective:  Patient states:  Very agreeable and talkative.    Pain:  7/10 in RUE, surgical pain.    Communication with other providers:  Handoff to RN, co-eval with OT.   Restrictions: partial WB RUE, ok for AROM and PROM at RUE.    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    Musculoskeletal   ROM R/L:  WFL.     Strength R/L:  4+/5, min weakness in function and endurance.     Neuro:  Decreased coordination     Gait pattern: reciprocal and improved stability when ambulating forward, increasing instability with turning, min decreased coordination and fluidity     Mobility:   Transfers: CGA   Sitting balance:  SBA.     Standing balance:  CGA.     Gait: CGA    AMPAC 6 Clicks Inpatient Mobility:  18/24    Treatment:  Transfers: STS from chair CGA with cane, from bed CGA, CGA for balance.    Gait: ambulated x78ft x2, x42ft, with CGA, increased instability with stopping/starting and turns. No overt LOB but requires CGA throughout.    Safety: patient left in chair, call light within reach, RN notified, gait belt used.    Assessment:  Patient is a 86 yo male who presents with R reverse shoulder replacement.  Patient demonstrates impaired mobility and has baseline impaired balance, rec d/c to SNF.    Complexity: Moderate  Prognosis: Good, no significant barriers to participation at this time.   Plan  /week, 1 week,      Equipment: TBD    Goals:  N/A, patient d/c.    Treatment plan:  Bed mobility, transfers, balance, gait, TA, TX,     Recommendations for NURSING mobility: ambulate with cane and gait belt.    Time:   Time in: 1028  Time out: 1122  Timed treatment minutes: 30  Total time: 53    Electronically signed by:    Jane Canary, PT  02/28/2019, 1:10 PM

## 2019-02-28 NOTE — Progress Notes (Signed)
Occupational Therapy    [place] [facility] ACUTE CARE OCCUPATIONAL THERAPY EVALUATION    Dalton Warner, 02/13/1930, 1119/1119-A, 02/28/2019    Discharge Recommendation: Skilled Nursing Facility      History:  HOPI:  The encounter diagnosis was History of arthroplasty of right shoulder.    Subjective:  Patient states: "I need to go to [facility] for a little rehab before I go back to my apartment."   Pain: Pt reported 8/10 surgical pain in Rt shoulder  Communication with other providers: PT Adelina Mings  Restrictions: General Precautions, Fall Risk, NWB Rt UE with sling, No ROM restrictions for Rt UE, Hemovac, Pocket Telemetry, Bed/chair alarm    Home Setup/Prior level of function:  Social/Functional History  Lives With: Alone  Type of Home: Apartment [facility])  Home Layout: One level  Home Access: Level entry  Bathroom Shower/Tub: Pension scheme manager: Handicap height  Bathroom Equipment: Biomedical scientist: Accessible  Home Equipment: Medical laboratory scientific officer, 4 wheeled walker  ADL Assistance: Independent  Homemaking Assistance: Independent  Homemaking Responsibilities: Yes  Ambulation Assistance: Independent (mod I with 4ww)  Transfer Assistance: Independent  Active Driver: Yes  Occupation: Retired  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 Pt reports he earned a doctorate degree in business and has written a book.    Examination:   Observation: Sitting in chair upon OT arrival. Pleasant and agreeable to evaluation. Very talkative throughout session requiring intermittent re-direction.    Vision: WFL    Hearing: WFL (with BL hearing aids)   Vitals: Stable vitals throughout session    Body Systems and functions:   ROM: Lt UE active shoulder flexion grossly 0-90' (old rotator cuff repair), Lt UE WNL distally, Rt UE minimal active or passive shoulder flexion (0-5' with significant guarding), Rt UE  elbow flexion/extension, wrist flexion/extension, and finger flexion/extension WFL   Strength: Lt UE 4+/5 MMT all major muscle groups, Rt UE not assessed due to NWB status   Sensation: Grossly WFL in BL UEs   Tone: Normal   Coordination: Lt hand WFL for ADLs, Rt hand with minimal functional use     Activities of Daily Living (ADLs):   Feeding: Supervision/setup (using non-dominant Lt hand; setup for cutting up food and opening packages/containers)   Grooming: Supervision/setup (using non dominant Lt hand; completed oral hygiene tasks of brushing/rinsing and grooming task of combing hair; setup for opening toothbrush package and applying paste to brush, able to unscrew toothpaste cap using Lt hand)   UB bathing: Min A (washing Lt arm due to limited functional Rt UE use)   LB bathing: Mod A (thoroughness with reaching distal LEs and for reaching bottom)   UB dressing: Max A (assist for threading Rt arm through robe sleeve and overhead mgmt)   LB dressing: Max A (dependent with donning BL socks this date, able to cross legs but unable to utilize one-handed technique this date, able to manage clothing to hips in standing with Lt hand)   Toileting: Max A (anticipate assist for clothing mgmt both directions)    Cognitive and Psychosocial Functioning:   Overall cognitive status: WNL (pt extremely sharp, very talkative requiring re-direction at times, full of very interesting stories)   Affect: Normal     Balance:    Sitting: SBA in unsupported sitting EOB   Standing: CGA with quad cane in Lt hand    Functional Mobility:  Bed Mobility: Not observed. Pt received sitting in chair.   Transfers: CGA to and from bed and recliner (min cues for technique/maintaining NWB status Rt UE)   Ambulation: CGA to Min A with quad cane 100 ft; intermittent unsteadiness, standing rest break required, mod coaching for sequencing steps with quad cane      AM-PAC 6 click short form for inpatient daily activity:   How much help  from another person does the patient currently need... Unable  Dep A Lot  Max A A Lot   Mod A A Little  Min A A Little   CGA  SBA None   Mod I  Indep  Sup   1.  Putting on and taking off regular lower body clothing? []  1    [x]  2   []  2   []  3   []  3   []  4      2. Bathing (including washing, rinsing, drying)? []  1   []  2   [x]  2 []  3 []  3 []  4   3. Toileting, which includes using toilet, bedpan, or urinal? []  1    [x]  2   []  2   []  3   []  3   []  4     4. Putting on and taking off regular upper body clothing? []  1   [x]  2   []  2   []  3   []  3    []  4      5. Taking care of personal grooming such as brushing teeth? []  1   []  2    []  2 []  3    [x]  3   []  4      6. Eating meals?   []  1   []  2   []  2   []  3   [x]  3   []  4      Raw Score:  14     [24=0% impaired(CH), 23=1-19%(CI), 20-22=20-39%(CJ), 15-19=40-59%(CK), 10-14=60-79%(CL), 7-9=80-99%(CM), 6=100%(CN)]     Treatment:  Therapeutic Activity Training:   Therapeutic activity training was instructed today.  Cues were given for safety, sequence, UE/LE placement, awareness, and balance.    Activities performed today included safe transfer training, HH mobility with quad cane, education on role of OT, POC, importance of OOB activity, NWB status Rt UE and wear of sling, AROM/PROM of Rt UE, d/c planning  Self Care Training:   Cues were given for safety, sequence, UE/LE placement, visual cues, and balance.    Activities performed today included UB/LB dressing tasks including donning/adjustment of sling, oral hygiene routine, grooming task of combing hair  Therapeutic Exercise:  Cues were given for technique, safety, recruitment, and rationale.  Cues were verbal and/or tactile.  Activities performed today included attempted PROM of Rt shoulder, AROM of Rt elbow, wrist, and fingers        Safety Measures: Gait belt used, Left in Chair, Alarm in place    Assessment:  Pt is an 86 year old male with a past medical history of Arthritis, Atrial fibrillation (HCC), Bleeding ulcer,  Cancer (HCC), Colon cancer (HCC), Diabetes mellitus (HCC), Fracture, HOH (hard of hearing), Hx of blood clots, Hx of fall, Hyperlipidemia, Hypertension, Limited range of motion (ROM) of shoulder, Neuropathy, Sleep apnea, TIA (transient ischemic attack), Unsteady gait, and Wears glasses. Pt admitted with Rt shoulder OA and underwent elective Rt shoulder reverse arthroplasty on 1-19. Pt lives alone in [facility] independent living and at baseline is independent with ADLs, IADLs, ambulates mod  I with a 4ww, and drives. Pt currently presents with the above impairments. Recommend rehab stay in SNF at discharge prior to returning to [place].      Complexity: Moderate  Prognosis: Good  Plan: Eval and discharge; d/c order in, leaving today      Time:   Time in: 1028  Time out: 1122  Timed treatment minutes: 39  Total time: 54      Electronically signed by:    Dimple Casey, OTR/L, MOT, [phone]

## 2019-02-28 NOTE — Discharge Instructions (Signed)
See BLUE FOLDER to be sent home with patient:   -Includes wound care instructions, signs / symptoms of infection with action              plan, and follow up instructions with contact information.   -Prescriptions included:    -Percocet 5mg 

## 2019-02-28 NOTE — Discharge Summary (Signed)
DISCHARGE SUMMARY:  Avel Sensor, MD     Date of Admission:      02/27/2019  Date of Discharge:    02/28/2019     Admission Diagnosis   History of arthroplasty of right shoulder [Z96.611]   Discharge Diagnosis   Same    Procedure:    Right Total shoulder replacement 02/27/2019    Consultations:  IP CONSULT TO CASE MANAGEMENT    Brief history and hospital stay.    The FAUSTINO FERM is a 86 y.o. male underwent total shoulder replacement procedure without complication.  Venita Sheffield was admitted to the floor following surgery.    Patient was admitted to the floor and appropriate consults were obtained as outlined in progress notes. The patient???s diet was advanced as tolerated per recommendations.  The patient remained neurovascularly intact in the lower extremity and had intact pulses distally.      Patient???s calf remained soft and showed no evidence of DVT.  The patient was able to move their affected extremity without any problems as their hospital course progressed.  Physical therapy and occupational therapy were consulted and began working with the patient during their stay.  The patient progressed with PT/OT as would be expected and continued to improve through their stay.      Hemoglobin was monitored and DVT prophylaxis was given.    The patients pain was controlled with the use off pain medications soon after arrival to the floor and their pain remained under good control through their hospital stay.      From a medical standpoint the patient remained stable and continued to have the medicine team follow throughout their stay.      The patient will be discharged at this time to Rehab with their current diet restrictions and will continue to follow the precautions outlined to them by [place] and PT/OT.     Condition on Discharge: Stable    Medications     Stefan, Mcfeely "Bill"   Home Medication Instructions [id number]    Printed on:03/05/19  1508   Medication Information                      acetaminophen (TYLENOL) 500 MG tablet  Take 500 mg by mouth every 6 hours as needed for Pain             albuterol sulfate (PROAIR RESPICLICK) 123XX123 (90 Base) MCG/ACT aerosol powder inhalation  2 puffs every 6 hours as needed              alfuzosin (UROXATRAL) 10 MG extended release tablet  Take 10 mg by mouth daily             atorvastatin (LIPITOR) 40 MG tablet  Take 40 mg by mouth daily             guaiFENesin (MUCINEX) 600 MG extended release tablet  Take 1,200 mg by mouth 2 times daily             loratadine (CLARITIN) 10 MG capsule  Take 10 mg by mouth daily              metoprolol succinate (TOPROL XL) 50 MG extended release tablet  Take 50 mg by mouth nightly              montelukast (SINGULAIR) 10 MG tablet  10 mg nightly  Plan  A discharge packet was given to the patient with post-operative instructions, Rx for pain medications as well as DVT prophylaxis.    Plan is to follow up in 14 days for suture removal.  Patient was instructed on the use of pain medications, the signs and symptoms of infection, and was given our number to call should they have any questions or concerns following discharge.    Avel Sensor, MD

## 2019-02-28 NOTE — Care Coordination-Inpatient (Addendum)
CM met w/ pt to initiate discharge planning. Pt is from [facility] independent living. Pt states he would like to go to the skilled unit. CM called Nira Conn, [facility] admissions, who stated pt is able to come today after rapid COVID completed. Perfect serve sent to Dr. Grandville Silos with request for rapid COVID order. Telephone order received and order placed for rapid COVID.     12:22 PM Transport set up w/ [facility] for 3:30-4:30pm. Updated primary RN, [facility] admissions. Pt will be going to [facility] at [facility].

## 2019-02-28 NOTE — Discharge Instructions (Addendum)
Continuity of Care Form    Patient Name: Dalton Warner   DOB:  [birth year]  MRN:  [id number]    Admit date:  02/27/2019  Discharge date:  02/28/19    Code Status Order: Full Code   Advance Directives:   Advance Care Flowsheet Documentation       Date/Time Healthcare Directive Type of Healthcare Directive Copy in Chart Healthcare Agent Appointed Healthcare Agent's Name Healthcare Agent's Phone Number    02/27/19 [phone]  Yes, patient has an advance directive for healthcare treatment --  No, copy requested from family -- -- --            Admitting Physician:  Gracelyn Nurse, MD  PCP: Leanna Sato, APRN - CNP    Discharging Nurse: A. [facility] Unit/Room#: 1119/1119-A  Discharging Unit Phone Number: [phone]    Emergency Contact:   Extended Emergency Contact Information  Primary Emergency Contact: Candelaria Celeste  Home Phone: [phone]  Mobile Phone: [phone]  Relation: Other  Secondary Emergency Contact: [facility]  Home Phone: [phone]  Relation: Child  Interpreter needed? No    Past Surgical History:  Past Surgical History:   Procedure Laterality Date   . ABDOMEN SURGERY  2000    per old chart had right hemicolectomy "removed 2 and a half feet of colon and my appendix"   . COLONOSCOPY  2019   . ENDOSCOPY, COLON, DIAGNOSTIC      per old chart had EGD done 08/2018 and 09/2018   . HERNIA REPAIR      per old chart open left ing hernia 1980 and then recurrent left ing hernia repair 08/2018   . JOINT REPLACEMENT      per old chart total left knee 2018   . PACEMAKER PLACEMENT      per old chart hx of st jude pacer insertion 2008- new pacer inserted 2019   . SHOULDER SURGERY Left 2015    rota. cuff       Immunization History:     There is no immunization history on file for this patient.    Active Problems:  Patient Active Problem List   Diagnosis Code   . Thrombocytopenia (HCC) D69.6   . History of arthroplasty of right shoulder Z96.611       Isolation/Infection:   Isolation             No Isolation          Patient Infection Status       Infection Onset Added Last Indicated Last Indicated By Review Planned Expiration Resolved Resolved By    None active    Resolved    COVID-19 Rule Out 02/20/19 02/20/19 02/20/19 Covid-19 Ambulatory (Ordered)   02/21/19 Rule-Out Test Resulted            Nurse Assessment:  Last Vital Signs: BP 120/65   Pulse 86   Temp 98.1 F (36.7 C)   Resp 18   Ht 5\' 7"  (1.702 m)   Wt 165 lb (74.8 kg)   SpO2 98%   BMI 25.84 kg/m     Last documented pain score (0-10 scale): Pain Level: 4  Last Weight:   Wt Readings from Last 1 Encounters:   02/27/19 165 lb (74.8 kg)     Mental Status:  oriented and alert    IV Access:  - None    Nursing Mobility/ADLs:  Walking   Assisted  Transfer  Assisted  Bathing  Assisted  Dressing  Assisted  Toileting  Assisted  Feeding  Assisted  Med Admin  Assisted  Med Delivery   whole    Wound Care Documentation and Therapy:        Elimination:  Continence:    Bowel: Yes   Bladder: Yes  Urinary Catheter: None   Colostomy/Ileostomy/Ileal Conduit: No       Date of Last BM: ***    Intake/Output Summary (Last 24 hours) at 02/28/2019 0720  Last data filed at 02/28/2019 0602  Gross per 24 hour   Intake 1500 ml   Output 580 ml   Net 920 ml     I/O last 3 completed shifts:  In: 1500 [I.V.:1200; Blood:300]  Out: 580 [Drains:30; Blood:550]    Safety Concerns:     At Risk for Falls    Impairments/Disabilities:      None    Patient's personal belongings (please select all that are sent with patient):  Hearing Aides bilateral    RN SIGNATURE:  Electronically signed by Jeanne Ivan, RN on 02/28/19 at 1:30 PM EST    CASE MANAGEMENT/SOCIAL WORK SECTION    Inpatient Status Date: ***    Readmission Risk Assessment Score:  Readmission Risk              Risk of Unplanned Readmission:        0           Discharging to Facility/ Agency    Name:    Address:   Phone:   Fax:    Dialysis Facility (if applicable)    Name:   Address:   Dialysis  Schedule:   Phone:   Fax:    Case Manager/Social Worker signature: {Esignature:304088025:::0}    PHYSICIAN SECTION  Nutrition Therapy:  Current Nutrition Therapy:   {MH COC Diet List:304088271:::0}    Routes of Feeding: {CHP DME Other Feedings:304088042:::0}  Liquids: {Slp liquid thickness:30034}  Daily Fluid Restriction: {CHP DME Yes amt example:304088041:::0}  Last Modified Barium Swallow with Video (Video Swallowing Test): {Done Not Done ZOXW:960454098:::1}    Treatments at the Time of Hospital Discharge:   Respiratory Treatments: ***  Oxygen Therapy:  {Therapy; copd oxygen:17808:::0}  Ventilator:    {MH CC Vent List:304088111:::0}    Rehab Therapies: {THERAPEUTIC INTERVENTION:502-274-1338}  Weight Bearing Status/Restrictions: {MH CC Weight Bearing:304508812:::0}  Other Medical Equipment (for information only, NOT a DME order):  {EQUIPMENT:304520077}  Other Treatments: ***    Prognosis: {Prognosis:580-288-8798:::0}    Condition at Discharge: {MH Patient Condition:304088024:::0}    Rehab Potential (if transferring to Rehab): {Prognosis:580-288-8798:::0}    Recommended Labs or Other Treatments After Discharge:     Physician Certification: I certify the above information and transfer of TRANQUILINO CIMINO  is necessary for the continuing treatment of the diagnosis listed and that he requires {Admit to Appropriate Level of Care:20763:::0} for {GREATER/LESS:304500278} 30 days.     Update Admission H&P: {CHP DME Changes in HandP:304088045:::0}    PHYSICIAN SIGNATURE:  Pamala Duffel. Janee Morn, MD

## 2019-03-02 ENCOUNTER — Inpatient Hospital Stay: Payer: MEDICARE | Primary: Family

## 2019-03-02 ENCOUNTER — Inpatient Hospital Stay: Admit: 2019-03-03 | Payer: MEDICARE | Primary: Family

## 2019-03-02 ENCOUNTER — Inpatient Hospital Stay
Admission: EM | Admit: 2019-03-02 | Discharge: 2019-03-05 | Disposition: A | Discharge status: Home / Self Care | Payer: Medicare Other | Source: Other Acute Inpatient Hospital | Admitting: Internal Medicine

## 2019-03-02 DIAGNOSIS — D62 Acute posthemorrhagic anemia: Secondary | ICD-10-CM

## 2019-03-02 LAB — CBC WITH AUTO DIFFERENTIAL
Basophils %: 0.3 % (ref 0–1)
Basophils Absolute: 0 10*3/uL
Eosinophils %: 0.8 % (ref 0–3)
Eosinophils Absolute: 0.1 K/CU MM
Hematocrit: 20.9 % — ABNORMAL LOW (ref 42–52)
Hemoglobin: 6.5 GM/DL — CL (ref 13.5–18.0)
Immature Neutrophil %: 1.5 % — ABNORMAL HIGH (ref 0–0.43)
Lymphocytes %: 14.6 % — ABNORMAL LOW (ref 24–44)
Lymphocytes Absolute: 1.1 10*3/uL
MCH: 29.4 PG (ref 27–31)
MCHC: 31.1 % — ABNORMAL LOW (ref 32.0–36.0)
MCV: 94.6 FL (ref 78–100)
MPV: 12.4 FL — ABNORMAL HIGH (ref 7.5–11.1)
Monocytes %: 12.2 % — ABNORMAL HIGH (ref 0–4)
Monocytes Absolute: 0.9 10*3/uL
Nucleated RBC %: 0 %
Platelets: 50 10*3/uL — ABNORMAL LOW (ref 140–440)
RBC: 2.21 10*6/uL — ABNORMAL LOW (ref 4.6–6.2)
RDW: 19.8 % — ABNORMAL HIGH (ref 11.7–14.9)
Segs Absolute: 5.3 10*3/uL
Segs Relative: 70.6 % — ABNORMAL HIGH (ref 36–66)
Total Immature Neutrophil: 0.11 10*3/uL
Total Nucleated RBC: 0 10*3/uL
WBC: 7.6 K/CU MM (ref 4.0–10.5)

## 2019-03-02 LAB — COMPREHENSIVE METABOLIC PANEL
ALT: 10 U/L (ref 10–40)
AST: 22 IU/L (ref 15–37)
Albumin: 3 GM/DL — ABNORMAL LOW (ref 3.4–5.0)
Alkaline Phosphatase: 44 IU/L (ref 40–129)
Anion Gap: 10 (ref 4–16)
BUN: 28 MG/DL — ABNORMAL HIGH (ref 6–23)
CO2: 24 MMOL/L (ref 21–32)
Calcium: 8.2 MG/DL — ABNORMAL LOW (ref 8.3–10.6)
Chloride: 100 mMol/L (ref 99–110)
Creatinine: 1.1 MG/DL (ref 0.9–1.3)
GFR African American: >60 mL/min/1.73m2 (ref >60)
GFR Non-African American: >60 mL/min/{1.73_m2} (ref >60)
Glucose: 153 MG/DL — ABNORMAL HIGH (ref 70–99)
Potassium: 4 MMOL/L (ref 3.5–5.1)
Sodium: 134 MMOL/L — ABNORMAL LOW (ref 135–145)
Total Bilirubin: 0.6 MG/DL (ref 0.0–1.0)
Total Protein: 5.2 GM/DL — ABNORMAL LOW (ref 6.4–8.2)

## 2019-03-02 LAB — IRON AND TIBC
Iron: 15 ug/dL — ABNORMAL LOW (ref 59–158)
TIBC: 194 ug/dL — ABNORMAL LOW (ref 250–450)
Transferrin %: 8 % — ABNORMAL LOW (ref 10–44)
UIBC: 179 ug/dL (ref 110–370)

## 2019-03-02 LAB — PROTIME-INR
INR: 1.5 INDEX
Protime: 18.2 SECONDS — ABNORMAL HIGH (ref 11.7–14.5)

## 2019-03-02 LAB — APTT: aPTT: 59.7 SECONDS — ABNORMAL HIGH (ref 25.1–37.1)

## 2019-03-02 LAB — MAGNESIUM: Magnesium: 1.9 mg/dl (ref 1.8–2.4)

## 2019-03-02 LAB — RETICULOCYTES: Retic Ct Pct: 2.2 % (ref 0.2–2.20)

## 2019-03-02 LAB — FERRITIN: Ferritin: 171 NG/ML (ref 30–400)

## 2019-03-02 MED ORDER — PANTOPRAZOLE SODIUM 40 MG IV SOLR
40 MG | Freq: Once | INTRAVENOUS | Status: AC
Start: 2019-03-02 — End: 2019-03-02
  Administered 2019-03-03: 80 mg via INTRAVENOUS

## 2019-03-02 MED ORDER — SODIUM CHLORIDE 0.9 % IV SOLN
0.9 | INTRAVENOUS | Status: DC | PRN
Start: 2019-03-02 — End: 2019-03-05

## 2019-03-02 MED ORDER — PANTOPRAZOLE SODIUM 40 MG IV SOLR
40 MG | INTRAVENOUS | Status: DC
Start: 2019-03-02 — End: 2019-03-03
  Administered 2019-03-03 (×2): 8 mg/h via INTRAVENOUS

## 2019-03-02 MED FILL — PANTOPRAZOLE SODIUM 40 MG IV SOLR: 40 mg | INTRAVENOUS | Qty: 80

## 2019-03-02 NOTE — ED Notes (Signed)
Consent for blood approved by all parties.      Ilean Skill, RN  03/02/19 [phone]

## 2019-03-02 NOTE — ED Triage Notes (Signed)
86 Y/o presented to ED with low Blood level .

## 2019-03-02 NOTE — ED Provider Notes (Signed)
As physician-in-triage, I performed a medical screening history and physical exam on this patient.    HISTORY OF PRESENT ILLNESS  Dalton Warner is a 86 y.o. male presents to the emergency department stating that he had recent shoulder surgery with Dr. Grandville Silos.  States he felt weak and lives alone and was concerned so he was transferred to another facility.  States they have been checking his blood and his hemoglobin dropped from 10-7 and they are sending him in for a blood transfusion.  He denies weakness..      PHYSICAL EXAM  BP (!) 151/107    Pulse 103    Temp 97 ??F (36.1 ??C)    Resp 14    SpO2 99%     On exam, the patient appears in no acute distress. Speech is clear. Breathing is unlabored.  Moves all extremities    Comment: Please note this report has been produced using speech recognition software and may contain errors related to that system including errors in grammar, punctuation, and spelling, as well as words and phrases that may be inappropriate. If there are any questions or concerns please feel free to contact the dictating provider for clarification.       Morley Kos, MD  03/02/19 1524

## 2019-03-02 NOTE — ED Notes (Signed)
1 unit of blood infused without difficulty. Pt to floor. VSS. Alert and oriented x 3.      Welford Roche, RN  03/02/19 2210

## 2019-03-02 NOTE — ED Notes (Signed)
Dr. Mickle Asper at bedside     Welford Roche, RN  03/02/19 [phone]

## 2019-03-02 NOTE — ED Notes (Signed)
Report given to [facility].      Ilean Skill, RN  03/02/19 2155

## 2019-03-02 NOTE — H&P (Signed)
History and Physical      Name:  Dalton Warner DOB/Age/Sex: 1930-07-27  (86 y.o. male)   MRN & CSN:  [id number] & [id number] Admission Date/Time: 03/02/2019  4:31 PM   Location:  ED28/ED-28 PCP: Carloyn Manner, [facility] Day: 1        Admitting Physician: Dr. Jerilynn Mages. Patel     Assessment and Plan:   Dalton [facility] is a 86 y.o. male who presents with Abnormal Lab (Reports low hemoglobin from labs drawn today)    ??? GI Bleed- suspected upper. Hx of recent bleed.  EGD performed 7/31"venous blebs in the esophagus; hiatal hernia; 40 mm mucosal papule with surface ulceration oozing blood"   Admit to PCU   IV PPI infusion   Consult GI    NPO                 ??? Symptomatic ABL Anemia H/H 6.5/20.9   Serial H/H q 6 hr/ T&S   Transfuse PRN patient agreeable. Reviewed risks and benefits with patient at bedside    ??? PAF- on chronic anticoagulation-Holding for now    ??? History of MDS follows with Dr. Franchot Mimes     Patient case discussed with ED provider    Diet    DVT Prophylaxis []  Lovenox, []   Heparin, [x]  SCDs, []  Ambulation  []  Long term AC   GI Prophylaxis []  PPI,  []  H2 Blocker,  []  Carafate,  []  Diet/Tube Feeds   Code Status Full     Disposition Admit to inpatient .    Patient plans to return home upon discharge   MDM []  Low, []  Moderate,[x]   High     -Patient assessment and plan discussed and reviewed with admitting physician: London Pepper, MD.     History of Present Illness:     Chief Complaint: Abnormal Lab (Reports low hemoglobin from labs drawn today)    Dalton Warner is a 86 y.o. male who presents with with concern of worsening anemia.  He has a recent right shoulder arthroplasty and discharge from the facility 12/29/2019 to [facility] home.  He was noted to have a 2 g drop in hemoglobin since discharge.  With an ~ 5 g drop in approximately 10 days.  He does report a recent significant GI bleed (08/2018) and was treated at [facility]. He reports generalized weakness. States significant shoulder  pain at surgical site    Denies HA, vision changes, fever, chills, sweats, CP, shortness of breath, wheezing, congestion, cough, abdominal pain, diarrhea, constipation, or dysuria.      Ten point ROS: reviewed negative, unless as noted in above HPI.    Objective:   No intake or output data in the 24 hours ending 03/02/19 1917     Vitals:   Vitals:    03/02/19 1517 03/02/19 1657 03/02/19 1914   BP: (!) 151/107     Pulse: 103 116 93   Resp: 14  22   Temp: 97 ??F (36.1 ??C)  98.3 ??F (36.8 ??C)   TempSrc:   Oral   SpO2: 99%  99%       Physical Exam: 03/02/19     GEN -Awake nontoxic appearing male, sitting upright in bed , NAD. Normal  body habitus. Appears given age.  EYES -PERRLA.  No scleral erythema, discharge, or conjunctivitis.  HENT -MM are moist. Oral pharynx without exudates, no evidence of thrush.  NECK -Supple, no apparent thyromegaly  or masses.  RESP -CTA, no wheezes, rales or rhonchi.  Symmetric chest movement while on RA.   C/V -S1/S2 auscultated. RRR holosystolic murmur. No JVD or carotid bruits. Peripheral pulses equal bilaterally and palpable. Cap refill <3 sec. No peripheral edema.   GI -Abdomen is soft non distended and without significant TTP. + BS. No masses or guarding.  Rectal exam deferred. No HSM  GU -No CVA/ flank tenderness. Foley catheter is not present.  LYMPH-No palpable cervical lymphadenopathy and no hepatosplenomegaly. No petechiae or ecchymoses.  MS -No gross joint deformities.  SKIN -pallor, warm, dry.  NEURO-Cranial nerves appear grossly intact, normal speech, no lateralizing weakness.  PSYC-Awake, alert, oriented x 4- person, place, time, situation,  Appropriate affect.    Past Medical History:      Past Medical History:   Diagnosis Date   ??? Arthritis    ??? Atrial fibrillation ([place])    ??? Bleeding ulcer 08/2018   ??? Cancer [facility])     Prostate cancer dx March 2010- tx with radiation    ??? Colon cancer ([place]) 2000    per old chart dx with colon ca - tx with surgery only   ??? Diabetes mellitus  ([place])     "was borderline- at one time was on Metformin for several yrs-  when I went to [facility] they had me stop this in 2017"   ??? Fracture     "had broken neck in Nov 2016- wore a collar for 6 weeks only"   ??? HOH (hard of hearing)     bil hearing aide   ??? Hx of blood clots     "had blood clot - it was a  blood clot in  my esophagus "   ??? Hx of fall     "last fall 2016- use cane walker and have electric chair"   ??? Hyperlipidemia    ??? Hypertension     follow with Dr Freeman Caldron at [facility]   ??? Limited range of motion (ROM) of shoulder     "right shoulder haved 10 % usage and left shoulder 60 % of usage"   ??? Neuropathy     both feet   ??? Sleep apnea     "had sleep study- tried to give me cpap but could not tolerate it "   ??? TIA (transient ischemic attack)     'in Jan 2009- trouble with speech, numbness of lip, weakness right arm - all only lasted for 20 seconds"   ??? Unsteady gait     "hx of back problem- was on steroids for 4 yrs- since then stability issues with my back -    ??? Wears glasses     to read        Past Surgical  History:    has a past surgical history that includes shoulder surgery (Left, 2015); Colonoscopy (2019); pacemaker placement; hernia repair; Endoscopy, colon, diagnostic; Abdomen surgery (2000); joint replacement; and shoulder surgery (Right, 02/27/2019).    Social History:    FAM HX: Reviewed  family history includes Cancer in his brother and mother; Diabetes in his mother and sister; High Blood Pressure in his brother.    Soc HX:   Social History     Socioeconomic History   ??? Marital status: Married     Spouse name: Not on file   ??? Number of children: Not on file   ??? Years of education: Not on file   ??? Highest education level: Not on file  Occupational History   ??? Not on file   Social Needs   ??? Financial resource strain: Not on file   ??? Food insecurity     Worry: Not on file     Inability: Not on file   ??? Transportation needs     Medical: Not on file     Non-medical: Not on file   Tobacco  Use   ??? Smoking status: Never Smoker   ??? Smokeless tobacco: Never Used   Substance and Sexual Activity   ??? Alcohol use: Yes     Comment: "maybe 6 times per year" "when had prostate cancer did drink one glass red wine daily in the past   ??? Drug use: Never   ??? Sexual activity: Not on file   Lifestyle   ??? Physical activity     Days per week: Not on file     Minutes per session: Not on file   ??? Stress: Not on file   Relationships   ??? Social Product manager on phone: Not on file     Gets together: Not on file     Attends religious service: Not on file     Active member of club or organization: Not on file     Attends meetings of clubs or organizations: Not on file     Relationship status: Not on file   ??? Intimate partner violence     Fear of current or ex partner: Not on file     Emotionally abused: Not on file     Physically abused: Not on file     Forced sexual activity: Not on file   Other Topics Concern   ??? Not on file   Social History Narrative   ??? Not on file     TOBACCO:   reports that he has never smoked. He has never used smokeless tobacco.  ETOH:   reports current alcohol use.  Drugs:  reports no history of drug use.    Allergies:   Allergies   Allergen Reactions   ??? Cat Hair Extract Other (See Comments)     Runny nose   ??? Dust Mite Extract Other (See Comments)     Runny nose   ??? Other    ??? Fish Allergy Nausea And Vomiting     Salmon only       Home Medications:     Prior to Admission medications    Medication Sig Start Date End Date Taking? Authorizing Provider   oxyCODONE-acetaminophen (PERCOCET) 5-325 MG per tablet Take 1 tablet by mouth every 6 hours as needed for Pain for up to 7 days. Intended supply: 7 days. Take lowest dose possible to manage pain 02/28/19 03/07/19  Avel Sensor, MD   guaiFENesin [facility]) 600 MG extended release tablet Take 1,200 mg by mouth 2 times daily    Historical Provider, MD   acetaminophen (TYLENOL) 500 MG tablet Take 500 mg by mouth every 6 hours as needed for Pain     Historical Provider, MD   alfuzosin (UROXATRAL) 10 MG extended release tablet Take 10 mg by mouth daily    Historical Provider, MD   rivaroxaban (XARELTO) 20 MG TABS tablet Take 20 mg by mouth    Historical Provider, MD   atorvastatin (LIPITOR) 40 MG tablet Take 40 mg by mouth daily    Historical Provider, MD   metoprolol succinate (TOPROL XL) 50 MG extended release tablet Take 50 mg by mouth nightly  Historical Provider, MD   albuterol sulfate (PROAIR RESPICLICK) 123XX123 (90 Base) MCG/ACT aerosol powder inhalation 2 puffs every 6 hours as needed     Historical Provider, MD   loratadine (CLARITIN) 10 MG capsule Take 10 mg by mouth daily     Historical Provider, MD   montelukast (SINGULAIR) 10 MG tablet 10 mg nightly  01/09/18   Historical Provider, MD         Medications:   Medications:   ??? pantoprazole          Infusions:   ??? pantoprozole (PROTONIX) infusion     ??? sodium chloride       PRN Meds:   ???  sodium chloride, , PRN        Data:     Laboratory this visit:  Reviewed  Recent Labs     02/28/19  0512 03/02/19  1540   WBC 12.5* 7.6   HGB 8.6* 6.5*   HCT 26.8* 20.9*   PLT 62* 50*      Recent Labs     02/28/19  0512 03/02/19  1540   NA 135 134*   K 4.8 4.0   CL 103 100   CO2 23 24   BUN 24* 28*   CREATININE 0.9 1.1     Recent Labs     03/02/19  1540   AST 22   ALT 10   BILITOT 0.6   ALKPHOS 44     Recent Labs     03/02/19  1540   INR 1.50         Radiology this visit:  Reviewed.    Xr Chest (2 Vw)    Result Date: 02/20/2019  EXAMINATION: TWO XRAY VIEWS OF THE CHEST 02/20/2019 2:46 pm COMPARISON: None. HISTORY: ORDERING SYSTEM PROVIDED HISTORY: Preoperative testing TECHNOLOGIST PROVIDED HISTORY: Reason for Exam: Preoperative testing Acuity: Acute Type of Exam: Initial Preoperative evaluation for shoulder surgery FINDINGS: Transvenous pacer place. The lungs are without acute focal process.  There is no effusion or pneumothorax. The cardiomediastinal silhouette is without acute process. The osseous structures are  without acute process.     No acute process.     Xr Shoulder Right (min 2 Views)    Result Date: 02/27/2019  EXAMINATION: THREE XRAY VIEWS OF THE RIGHT SHOULDER 02/27/2019 4:50 pm COMPARISON: None. HISTORY: ORDERING SYSTEM PROVIDED HISTORY: post op arthroplasty TECHNOLOGIST PROVIDED HISTORY: AP, Grashey, Scapular Y view, axillary views Reason for exam:->post op arthroplasty Reason for exam:->AP, Grashey, Scapular Y view, axillary views Reason for Exam: post op arthroplasty Acuity: Unknown Type of Exam: Initial Additional signs and symptoms: none Relevant Medical/Surgical History: arthritis FINDINGS: Reverse glenohumeral arthroplasty appears normally aligned, without hardware failure or fracture.  Other osseous structures of the shoulder appear intact and align normally.  AC joint space narrowing and marginal osteophytosis is seen.  Visualized portions of the hemithorax are unremarkable.  Surgical skin closure staples are seen posterolateral about the shoulder joint.     Unremarkable appearance of reverse glenohumeral arthroplasty. AC joint osteoarthritis.     Xr Shoulder Right (min 2 Views)    Result Date: 02/20/2019  EXAMINATION: THREE XRAY VIEWS OF THE RIGHT SHOULDER 02/20/2019 2:46 pm COMPARISON: None. HISTORY: ORDERING SYSTEM PROVIDED HISTORY: Preoperative testing TECHNOLOGIST PROVIDED HISTORY: Order reads AP and lateral operative shoulder( for right shoulder surg 02/20/2019 Reason for Exam: Preoperative testing Acuity: Acute Type of Exam: Initial FINDINGS: Anatomic alignment.  No fracture.  No destructive bony abnormality.  Severe degenerative changes in the  glenohumeral joint and AC joint.     1. No acute bony abnormality 2. Severe degenerative changes in the glenohumeral joint and AC joint     EKG this visit:  Reviewed.      Electronically signed by Carolee Rota, APRN - CNP on 03/02/2019 at 7:17 PM

## 2019-03-02 NOTE — ED Notes (Signed)
1102 ready bed      Deane Wattenbarger Catarina Hartshorn  03/02/19 2136

## 2019-03-02 NOTE — Care Coordination-Inpatient (Signed)
CM review of pt chart for readmission risk, last admission 1/19-20/21, surgery to rt shoulder. Pt from [facility] was discharged back to SNF/rehab unit at [facility].    Pt returns to ER today from [facility]SNF due to low hemoglobin, results 6.5. [facility] sent for blood transfusion.    Spoke with Dr Hollice Espy ER provider, Plans to infuse and discharge back to [facility]. BAM,RN/CM

## 2019-03-02 NOTE — ED Provider Notes (Signed)
Emergency Department Encounter    Patient: Dalton Warner  MRN: [id number]  DOB: [birth year]  Date of Evaluation: 03/03/2019  ED Provider:  Bryelle Spiewak      Triage Chief Complaint:   Abnormal Lab (Reports low hemoglobin from labs drawn today)      HOPI:  Dalton Warner is a 86 y.o. male that presents to the emergency department with abnormal labs, severe anemia.  Patient underwent recent right shoulder surgery at this hospital.  Due to pain and need for rehabilitation, patient was discharged to the [facility].  Upon discharge from this hospital on Wednesday, he had a hemoglobin of about 10 he reports.  Upon recheck prior to arrival today, hemoglobin was down to 7.  Patient does report history of prior transfusion.  He does not report recent dark tarry or bloody stools.  He reports significant pain associated with his right shoulder surgery, generally controlled with Percocet.  He denies abdominal pain or chest pain otherwise.  Patient reports no other particular provocative or alleviating factors.    Patient reports that about 6 months ago he had a bleeding ulcer.  Endoscopy was performed but they could not find the exact spot that was bleeding right away.  "They were concerned that they were going to lose me for a while."    ROS - see HPI, below listed is current ROS at time of my eval:  CONSTITUTIONAL: No fevers, chills, or sweats.  EYES: No vision change, redness, drainage, or discharge.  HENT: No sore throat, runny nose, or earache.  No dental pain.  No painful swallowing.  RESPIRATORY: No difficulty breathing, cough, or sputum production.  CARDIOVASCULAR: No anginal-type chest pain, orthopnea, or edema.  GASTROINTESTINAL: No nausea, vomiting, or abdominal pain.  No diarrhea or constipation.  No hematemesis, hematochezia, or melena.  GENITOURINARY: No frequency, urgency, or dysuria.  No hematuria.  MUSCULOSKELETAL: As above.  NEUROLOGICAL: No focal weakness, numbness, or tingling.  SKIN: No rashes or  other lesions reported.  No yellowing of the skin.      Past Medical History:   Diagnosis Date   . Arthritis    . Atrial fibrillation (HCC)    . Bleeding ulcer 08/2018   . Cancer [facility])     Prostate cancer dx March 2010- tx with radiation    . Colon cancer (HCC) 2000    per old chart dx with colon ca - tx with surgery only   . Diabetes mellitus (HCC)     "was borderline- at one time was on Metformin for several yrs-  when I went to [facility] they had me stop this in 2017"   . Fracture     "had broken neck in Nov 2016- wore a collar for 6 weeks only"   . HOH (hard of hearing)     bil hearing aide   . Hx of blood clots     "had blood clot - it was a  blood clot in  my esophagus "   . Hx of fall     "last fall 2016- use cane walker and have electric chair"   . Hyperlipidemia    . Hypertension     follow with Dr Maryclare Labrador at [facility]   . Limited range of motion (ROM) of shoulder     "right shoulder haved 10 % usage and left shoulder 60 % of usage"   . Neuropathy     both feet   . Sleep apnea     "  had sleep study- tried to give me cpap but could not tolerate it "   . TIA (transient ischemic attack)     'in Jan 2009- trouble with speech, numbness of lip, weakness right arm - all only lasted for 20 seconds"   . Unsteady gait     "hx of back problem- was on steroids for 4 yrs- since then stability issues with my back -    . Wears glasses     to read      Past Surgical History:   Procedure Laterality Date   . ABDOMEN SURGERY  2000    per old chart had right hemicolectomy "removed 2 and a half feet of colon and my appendix"   . COLONOSCOPY  2019   . ENDOSCOPY, COLON, DIAGNOSTIC      per old chart had EGD done 08/2018 and 09/2018   . HERNIA REPAIR      per old chart open left ing hernia 1980 and then recurrent left ing hernia repair 08/2018   . JOINT REPLACEMENT      per old chart total left knee 2018   . PACEMAKER PLACEMENT      per old chart hx of st jude pacer insertion 2008- new pacer inserted 2019   . SHOULDER SURGERY  Left 2015    rota. cuff   . SHOULDER SURGERY Right 02/27/2019    RIGHT REVERSE SHOULDER TOTAL ARTHROPLASTY REVERSE performed by Gracelyn Nurse, MD at [facility] OR     Family History   Problem Relation Age of Onset   . Cancer Mother         lung cancer   . Diabetes Mother    . Diabetes Sister    . Cancer Brother    . High Blood Pressure Brother      Social History     Socioeconomic History   . Marital status: Married     Spouse name: Not on file   . Number of children: Not on file   . Years of education: Not on file   . Highest education level: Not on file   Occupational History   . Not on file   Social Needs   . Financial resource strain: Not on file   . Food insecurity     Worry: Not on file     Inability: Not on file   . Transportation needs     Medical: Not on file     Non-medical: Not on file   Tobacco Use   . Smoking status: Never Smoker   . Smokeless tobacco: Never Used   Substance and Sexual Activity   . Alcohol use: Yes     Comment: "maybe 6 times per year" "when had prostate cancer did drink one glass red wine daily in the past   . Drug use: Never   . Sexual activity: Not on file   Lifestyle   . Physical activity     Days per week: Not on file     Minutes per session: Not on file   . Stress: Not on file   Relationships   . Social Wellsite geologist on phone: Not on file     Gets together: Not on file     Attends religious service: Not on file     Active member of club or organization: Not on file     Attends meetings of clubs or organizations: Not on file     Relationship status: Not  on file   . Intimate partner violence     Fear of current or ex partner: Not on file     Emotionally abused: Not on file     Physically abused: Not on file     Forced sexual activity: Not on file   Other Topics Concern   . Not on file   Social History Narrative   . Not on file     Current Facility-Administered Medications   Medication Dose Route Frequency Provider Last Rate Last Admin   . 0.9 % sodium chloride infusion    Intravenous PRN Rosezetta Schlatter, MD       . Melene Muller ON 03/04/2019] pantoprazole (PROTONIX) injection 40 mg  40 mg Intravenous BID Ilsa Iha, MD       . 0.9 % sodium chloride infusion   Intravenous PRN Amanda Pea, MD       . sodium chloride flush 0.9 % injection 10 mL  10 mL Intravenous 2 times per day Nicholes Calamity, APRN - CNP       . sodium chloride flush 0.9 % injection 10 mL  10 mL Intravenous PRN Nicholes Calamity, APRN - CNP       . promethazine (PHENERGAN) tablet 12.5 mg  12.5 mg Oral Q6H PRN Nicholes Calamity, APRN - CNP        Or   . ondansetron [facility]) injection 4 mg  4 mg Intravenous Q6H PRN Nicholes Calamity, APRN - CNP       . acetaminophen (TYLENOL) tablet 650 mg  650 mg Oral Q6H PRN Nicholes Calamity, APRN - CNP        Or   . acetaminophen (TYLENOL) suppository 650 mg  650 mg Rectal Q6H PRN Nicholes Calamity, APRN - CNP       . oxyCODONE-acetaminophen (PERCOCET) 5-325 MG per tablet 1 tablet  1 tablet Oral Q6H PRN Nicholes Calamity, APRN - CNP   1 tablet at 03/03/19 0733   . guaiFENesin (MUCINEX) extended release tablet 600 mg  600 mg Oral BID Treasa School, APRN - NP   600 mg at 03/03/19 0981     Allergies   Allergen Reactions   . Cat Hair Extract Other (See Comments)     Runny nose   . Dust Mite Extract Other (See Comments)     Runny nose   . Other    . Fish Allergy Nausea And Vomiting     Salmon only       Nursing Notes Reviewed    Physical Exam:  Triage VS:    ED Triage Vitals [03/02/19 1517]   Enc Vitals Group      BP (!) 151/107      Pulse 103      Resp 14      Temp 97 F (36.1 C)      Temp src       SpO2 99 %      Weight       Height       Head Circumference       Peak Flow       Pain Score       Pain Loc       Pain Edu?       Excl. in GC?        My pulse ox interpretation is - normal    GENERAL: Patient is awake, alert, and oriented appropriately.  Patient is resting comfortably in a still position on the exam table.  Patient speaking in  full and complete sentences.  Well-nourished and well-developed.    HEENT: Normocephalic and  atraumatic.  No midface, zygomatic, maxillary, or mandibular tenderness.  No dental malocclusion.  Pupils equal, round, and reactive to light.  Pale conjunctiva.  No redness or matting.  Bilateral external ears are unremarkable.  Tympanic membranes are pearly and gray without visible effusion or retraction.  Nasal mucosa is pink without purulence.  Oral mucosa is moist and pink.    NECK: Supple without Kernig's or Brudzinski signs.  No significant lymphadenopathy or limitation range of motion.  No midline spinal tenderness.    RESPIRATORY: Symmetric aeration bilaterally.  No audible wheezes, rales, rhonchi, or stridor.  No chest wall tenderness.    CARDIOVASCULAR: Mild tachycardia.  No audible murmurs, rubs, or gallops.  No central or peripheral cyanosis.    GASTROINTESTINAL: Soft, nontender, and nondistended.  No McBurney's or Murphy's point tenderness.  No guarding, rebound, rigidity.  No mass or pulsatile mass.  Bowel sounds are present in all quadrants.  No costovertebral angle tenderness.  Rectal exam performed with a male chaperone present reveals no visible or palpable hemorrhoids, fissures, fistulae, or mass.  Maroon stool without gross blood noted on the examining finger.  Stool is guaiac positive with a positive quality control.    NEUROLOGICAL: Awake, alert and oriented x 3.  Cranial nerves III through XII are grossly intact as tested without facial droop or dermatomal paresthesias.  Of note, forehead wrinkles are symmetric and intact.  Conjugate gaze without entrapment.  No asymmetry of the corners of the mouth or nasolabial folds.  No gross motor or cerebellar deficits.  Right arm is in a sling and swath, but motor and sensory function appears appropriate.    MUSCULOSKELETAL: Right shoulder sling and swath.  Surgical sites appear clean with intact dressing.  No asymmetric edema, Denna Haggard' sign, or cords.  No tenderness or limitation range of motion to the bilateral shoulders, elbows, wrists, hips,  knees, or ankles.  No accompanying long bone tenderness or deformity.    SKIN: Normal tone for ethnicity.  Normal turgor and brisk capillary refill peripherally.  No petechiae, purpura, vesicles, bullae, or other lesions.  No icterus.    PSYCHIATRIC: Normal mood.  Normal affect.  No voiced suicidal or homicidal ideation.  Patient does not respond to internal stimuli.    Emergency department course.  Patient is brought to bed 28 and assessed and reassessed by me.  Prior to initial evaluation, initial medical screening studies ordered by the provider in triage.  Additional orders are placed for iron studies among others.  After initial evaluation, medical screening studies are pending.  I am concerned about the precipitous drop in the patient's hemoglobin, particularly without an adequate explanation.  We have begun to discuss placement in the hospital for further evaluation.  Patient is agreeable to continuing plan.     Review of recent laboratory results show hemoglobin of 11.2 on December 21, 2019.  Hemoglobin was 8.6 on February 28, 2019.    As of approximately 1835, rectal exam was completed and does appear guaiac positive.  With history of bleeding ulcers, an upper GI source is presumed until ruled out.  Pantoprazole bolus and infusion are ordered.  First unit of packed red blood cells is ordered for transfusion.  Patient is agreeable to admission to the hospital.  Upon most recent reevaluation, patient remains clinically stable.  We have discussed benefits of admission to the hospital, particularly with the precipitous decline in hemoglobin and concern  for ongoing bleeding.  Patient is agreeable. Hospitalist is paged at 1838 to discuss admission.  Initial verbal orders are discussed at 1856.    Decision time to admit: 1838.      Patient seen during Covid19 Pandemic, I did don appropriate PPE during my encounters with the patient, including n95 (when appropriate) mask and eye protection as appropriate.    I have  reviewed and interpreted all of the currently available lab results from this visit (if applicable):  Results for orders placed or performed during the hospital encounter of 03/02/19   CBC with Auto Diff   Result Value Ref Range    WBC 7.6 4.0 - 10.5 K/CU MM    RBC 2.21 (L) 4.6 - 6.2 M/CU MM    Hemoglobin 6.5 (LL) 13.5 - 18.0 GM/DL    Hematocrit 16.1 (L) 42 - 52 %    MCV 94.6 78 - 100 FL    MCH 29.4 27 - 31 PG    MCHC 31.1 (L) 32.0 - 36.0 %    RDW 19.8 (H) 11.7 - 14.9 %    Platelets 50 (L) 140 - 440 K/CU MM    MPV 12.4 (H) 7.5 - 11.1 FL    Differential Type AUTOMATED DIFFERENTIAL     Segs Relative 70.6 (H) 36 - 66 %    Lymphocytes % 14.6 (L) 24 - 44 %    Monocytes % 12.2 (H) 0 - 4 %    Eosinophils % 0.8 0 - 3 %    Basophils % 0.3 0 - 1 %    Segs Absolute 5.3 K/CU MM    Lymphocytes Absolute 1.1 K/CU MM    Monocytes Absolute 0.9 K/CU MM    Eosinophils Absolute 0.1 K/CU MM    Basophils Absolute 0.0 K/CU MM    Nucleated RBC % 0.0 %    Total Nucleated RBC 0.0 K/CU MM    Total Immature Neutrophil 0.11 K/CU MM    Immature Neutrophil % 1.5 (H) 0 - 0.43 %   CMP   Result Value Ref Range    Sodium 134 (L) 135 - 145 MMOL/L    Potassium 4.0 3.5 - 5.1 MMOL/L    Chloride 100 99 - 110 mMol/L    CO2 24 21 - 32 MMOL/L    BUN 28 (H) 6 - 23 MG/DL    CREATININE 1.1 0.9 - 1.3 MG/DL    Glucose 096 (H) 70 - 99 MG/DL    Calcium 8.2 (L) 8.3 - 10.6 MG/DL    Alb 3.0 (L) 3.4 - 5.0 GM/DL    Total Protein 5.2 (L) 6.4 - 8.2 GM/DL    Total Bilirubin 0.6 0.0 - 1.0 MG/DL    ALT 10 10 - 40 U/L    AST 22 15 - 37 IU/L    Alkaline Phosphatase 44 40 - 129 IU/L    GFR Non-African American >60 >60 mL/min/1.67m2    GFR African American >60 >60 mL/min/1.45m2    Anion Gap 10 4 - 16   Protime-INR   Result Value Ref Range    Protime 18.2 (H) 11.7 - 14.5 SECONDS    INR 1.50 INDEX   APTT   Result Value Ref Range    aPTT 59.7 (H) 25.1 - 37.1 SECONDS   Iron and TIBC   Result Value Ref Range    Iron 15 (L) 59 - 158 ug/dL    UIBC 045 409 - 811 ug/dL    TIBC 914 (L)  782 -  450 ug/dL    Transferrin % 8 (L) 10 - 44 %   Ferritin   Result Value Ref Range    Ferritin 171 30 - 400 NG/ML   Reticulocytes   Result Value Ref Range    Retic Ct Pct 2.2 0.2 - 2.20 %   Urinalysis, reflex to microscopic   Result Value Ref Range    Color, UA YELLOW YELLOW    Clarity, UA CLEAR CLEAR    Glucose, Urine NEGATIVE NEGATIVE MG/DL    Bilirubin Urine NEGATIVE NEGATIVE MG/DL    Ketones, Urine NEGATIVE NEGATIVE MG/DL    Specific Gravity, UA 1.024 1.001 - 1.035    Blood, Urine NEGATIVE NEGATIVE    pH, Urine 5.0 5.0 - 8.0    Protein, UA NEGATIVE NEGATIVE MG/DL    Urobilinogen, Urine 1 0.2 - 1.0 MG/DL    Nitrite Urine, Quantitative NEGATIVE NEGATIVE    Leukocyte Esterase, Urine NEGATIVE NEGATIVE    RBC, UA 1 0 - 3 /HPF    WBC, UA 1 0 - 2 /HPF    Bacteria, UA NEGATIVE NEGATIVE /HPF    Yeast, UA RARE /HPF    Squam Epithel, UA <1 /HPF    Mucus, UA RARE (A) NEGATIVE HPF    Trichomonas, UA NONE SEEN NONE SEEN /HPF   Magnesium   Result Value Ref Range    Magnesium 1.9 1.8 - 2.4 mg/dl   [id number]    Specimen: Nasopharyngeal Swab   Result Value Ref Range    Source THROAT     SARS-CoV-2, NAAT NOT DETECTED    Comprehensive Metabolic Panel w/ Reflex to MG   Result Value Ref Range    Sodium 133 (L) 135 - 145 MMOL/L    Potassium 4.2 3.5 - 5.1 MMOL/L    Chloride 102 99 - 110 mMol/L    CO2 21 21 - 32 MMOL/L    BUN 28 (H) 6 - 23 MG/DL    CREATININE 0.9 0.9 - 1.3 MG/DL    Glucose 188 (H) 70 - 99 MG/DL    Calcium 7.6 (L) 8.3 - 10.6 MG/DL    Alb 3.0 (L) 3.4 - 5.0 GM/DL    Total Protein 4.4 (L) 6.4 - 8.2 GM/DL    Total Bilirubin 1.0 0.0 - 1.0 MG/DL    ALT 11 10 - 40 U/L    AST 22 15 - 37 IU/L    Alkaline Phosphatase 40 40 - 129 IU/L    GFR Non-African American >60 >60 mL/min/1.12m2    GFR African American >60 >60 mL/min/1.55m2    Anion Gap 10 4 - 16   CBC   Result Value Ref Range    WBC 7.5 4.0 - 10.5 K/CU MM    RBC 2.41 (L) 4.6 - 6.2 M/CU MM    Hemoglobin 7.2 (L) 13.5 - 18.0 GM/DL    Hematocrit 41.6 (L) 42 - 52 %    MCV 91.3 78  - 100 FL    MCH 29.9 27 - 31 PG    MCHC 32.7 32.0 - 36.0 %    RDW 18.6 (H) 11.7 - 14.9 %    Platelets 45 (L) 140 - 440 K/CU MM    MPV 11.3 (H) 7.5 - 11.1 FL   APTT   Result Value Ref Range    aPTT 46.4 (H) 25.1 - 37.1 SECONDS   Protime-INR   Result Value Ref Range    Protime 17.0 (H) 11.7 - 14.5 SECONDS    INR 1.40 INDEX  Hemoglobin and Hematocrit, Blood   Result Value Ref Range    Hemoglobin 7.5 (L) 13.5 - 18.0 GM/DL    Hematocrit 13.0 (L) 42 - 52 %   TYPE AND SCREEN   Result Value Ref Range    ABO/Rh A POSITIVE     Antibody Screen NEGATIVE     Unit Number [id number]     Component LEUKO-POOR RED CELLS     Unit Divison 00     Status ISSUED,FINAL     Transfusion Status OK TO TRANSFUSE     Crossmatch Result COMPATIBLE    TYPE AND SCREEN   Result Value Ref Range    ABO/Rh A POSITIVE     Antibody Screen NEGATIVE     Unit Number [id number]     Component LEUKO-POOR RED CELLS     Unit Divison 00     Status ISSUED     Transfusion Status OK TO TRANSFUSE     Crossmatch Result COMPATIBLE         Radiographs (if obtained):  Radiologist's Report Reviewed:  Xr Acute Abd Series Chest 1 Vw    Result Date: 03/02/2019  EXAMINATION: TWO XRAY VIEWS OF THE ABDOMEN AND SINGLE  XRAY VIEW OF THE CHEST 03/02/2019 7:41 pm COMPARISON: 02/20/2019 HISTORY: ORDERING SYSTEM PROVIDED HISTORY: Severe anemia, likely upper GI hemorrhage TECHNOLOGIST PROVIDED HISTORY: Reason for exam:->Severe anemia, likely upper GI hemorrhage Reason for Exam: Severe anemia, likely upper GI hemorrhage Acuity: Acute Type of Exam: Initial Additional signs and symptoms: na Relevant Medical/Surgical History: colon cancer, hypertension FINDINGS: Cardiomediastinal silhouette is stable.  The lungs are clear.  No pleural effusion or pneumothorax.  No gross bony abnormality.  Gaseous distention of multiple loops of small and large bowel.  Moderate volume stool throughout the colon.  No radiodense renal calculi.  No free air or pneumatosis.     1. No acute cardiopulmonary  abnormality. 2. Mild ileus.    Medical decision making:  Patient presents to the emergency department with a precipitous decline in hemoglobin over the past 1 to 2 weeks.  He has guaiac positive stools today, though they are not frankly bloody.  Abdomen is soft and nontender, but he does report history of bleeding ulcers.  Physical exam does not strongly suggest mesenteric ischemia or an aortic catastrophe or other surgical emergency otherwise.  There has been no respiratory distress, hypoxia, or cyanosis.  There is no chest pain or discomfort.  Though there is a severe anemia, there does not appear to be evidence of endorgan injury otherwise at this time.    Procedures: Rectal exam completed by me with a male chaperone present.    Consultations: Hospitalist service.    Clinical Impression:  1. Severe anemia    2. Guaiac positive stools    3. History of peptic ulcer disease      Disposition referral (if applicable):  No follow-up provider specified.  Disposition medications (if applicable):  Current Discharge Medication List        ED Provider Disposition Time  DISPOSITION Admitted 03/02/2019 07:16:15 PM      Comment: Please note this report has been produced using speech recognition software and may contain errors related to that system including errors in grammar, punctuation, and spelling, as well as words and phrases that may be inappropriate.  Efforts were made to edit the dictations.        Amanda Pea, MD  03/03/19 [phone]

## 2019-03-03 ENCOUNTER — Encounter: Primary: Family

## 2019-03-03 LAB — URINALYSIS
Bacteria, UA: NEGATIVE /HPF
Bilirubin Urine: NEGATIVE MG/DL
Blood, Urine: NEGATIVE
Glucose, Urine: NEGATIVE MG/DL
Ketones, Urine: NEGATIVE MG/DL
Leukocyte Esterase, Urine: NEGATIVE
Nitrite Urine, Quantitative: NEGATIVE
Protein, UA: NEGATIVE MG/DL
Specific Gravity, UA: 1.024 (ref 1.001–1.035)
Squam Epithel, UA: <1 /HPF
Trichomonas, UA: NONE SEEN /HPF
Urobilinogen, Urine: 1 MG/DL (ref 0.2–1.0)
WBC, UA: 1 /HPF (ref 0–2)

## 2019-03-03 LAB — COMPREHENSIVE METABOLIC PANEL W/ REFLEX TO MG FOR LOW K
ALT: 11 U/L (ref 10–40)
AST: 22 IU/L (ref 15–37)
Albumin: 3 GM/DL — ABNORMAL LOW (ref 3.4–5.0)
Alkaline Phosphatase: 40 IU/L (ref 40–129)
Anion Gap: 10 (ref 4–16)
BUN: 28 MG/DL — ABNORMAL HIGH (ref 6–23)
CO2: 21 MMOL/L (ref 21–32)
Calcium: 7.6 MG/DL — ABNORMAL LOW (ref 8.3–10.6)
Chloride: 102 mMol/L (ref 99–110)
Creatinine: 0.9 MG/DL (ref 0.9–1.3)
GFR African American: >60 mL/min/{1.73_m2} (ref >60)
GFR Non-African American: >60 mL/min/{1.73_m2} (ref >60)
Glucose: 120 MG/DL — ABNORMAL HIGH (ref 70–99)
Potassium: 4.2 MMOL/L (ref 3.5–5.1)
Sodium: 133 MMOL/L — ABNORMAL LOW (ref 135–145)
Total Bilirubin: 1 MG/DL (ref 0.0–1.0)
Total Protein: 4.4 GM/DL — ABNORMAL LOW (ref 6.4–8.2)

## 2019-03-03 LAB — TYPE AND SCREEN
ABO/Rh: A POS
Antibody Screen: NEGATIVE
Unit Divison: 0

## 2019-03-03 LAB — PROTIME-INR
INR: 1.4 INDEX
Protime: 17 SECONDS — ABNORMAL HIGH (ref 11.7–14.5)

## 2019-03-03 LAB — CBC
Hematocrit: 22 % — ABNORMAL LOW (ref 42–52)
Hemoglobin: 7.2 GM/DL — ABNORMAL LOW (ref 13.5–18.0)
MCH: 29.9 PG (ref 27–31)
MCHC: 32.7 % (ref 32.0–36.0)
MCV: 91.3 FL (ref 78–100)
MPV: 11.3 FL — ABNORMAL HIGH (ref 7.5–11.1)
Platelets: 45 10*3/uL — ABNORMAL LOW (ref 140–440)
RBC: 2.41 10*6/uL — ABNORMAL LOW (ref 4.6–6.2)
RDW: 18.6 % — ABNORMAL HIGH (ref 11.7–14.9)
WBC: 7.5 10*3/uL (ref 4.0–10.5)

## 2019-03-03 LAB — HEMOGLOBIN AND HEMATOCRIT
Hematocrit: 24 % — ABNORMAL LOW (ref 42–52)
Hemoglobin: 7.5 GM/DL — ABNORMAL LOW (ref 13.5–18.0)

## 2019-03-03 LAB — APTT: aPTT: 46.4 SECONDS — ABNORMAL HIGH (ref 25.1–37.1)

## 2019-03-03 LAB — COVID-19: SARS-CoV-2, NAAT: NOT DETECTED

## 2019-03-03 MED ORDER — NORMAL SALINE FLUSH 0.9 % IV SOLN
0.9 % | INTRAVENOUS | Status: DC | PRN
Start: 2019-03-03 — End: 2019-03-05
  Administered 2019-03-05: 02:00:00 10 mL via INTRAVENOUS

## 2019-03-03 MED ORDER — OXYCODONE-ACETAMINOPHEN 5-325 MG PO TABS
5-325 MG | Freq: Four times a day (QID) | ORAL | Status: DC | PRN
Start: 2019-03-03 — End: 2019-03-05
  Administered 2019-03-03 – 2019-03-05 (×6): 1 via ORAL

## 2019-03-03 MED ORDER — PANTOPRAZOLE SODIUM 40 MG IV SOLR
40 MG | Freq: Two times a day (BID) | INTRAVENOUS | Status: DC
Start: 2019-03-03 — End: 2019-03-05
  Administered 2019-03-04 – 2019-03-05 (×3): 40 mg via INTRAVENOUS

## 2019-03-03 MED ORDER — ACETAMINOPHEN 650 MG RE SUPP
650 MG | Freq: Four times a day (QID) | RECTAL | Status: DC | PRN
Start: 2019-03-03 — End: 2019-03-05

## 2019-03-03 MED ORDER — GUAIFENESIN ER 600 MG PO TB12
600 MG | Freq: Two times a day (BID) | ORAL | Status: DC
Start: 2019-03-03 — End: 2019-03-05
  Administered 2019-03-03 – 2019-03-05 (×6): 600 mg via ORAL

## 2019-03-03 MED ORDER — SODIUM CHLORIDE 0.9 % IV SOLN
0.9 | INTRAVENOUS | Status: DC | PRN
Start: 2019-03-03 — End: 2019-03-04

## 2019-03-03 MED ORDER — NORMAL SALINE FLUSH 0.9 % IV SOLN
0.9 % | Freq: Two times a day (BID) | INTRAVENOUS | Status: DC
Start: 2019-03-03 — End: 2019-03-05
  Administered 2019-03-04 – 2019-03-05 (×4): 10 mL via INTRAVENOUS

## 2019-03-03 MED ORDER — PROMETHAZINE HCL 25 MG PO TABS
25 MG | Freq: Four times a day (QID) | ORAL | Status: DC | PRN
Start: 2019-03-03 — End: 2019-03-05

## 2019-03-03 MED ORDER — SODIUM CHLORIDE 0.9 % IV SOLN
0.9 | INTRAVENOUS | Status: DC
Start: 2019-03-03 — End: 2019-03-03
  Administered 2019-03-03: 03:00:00 via INTRAVENOUS

## 2019-03-03 MED ORDER — PANTOPRAZOLE SODIUM 40 MG IV SOLR
40 MG | INTRAVENOUS | Status: AC
Start: 2019-03-03 — End: 2019-03-03

## 2019-03-03 MED ORDER — ONDANSETRON HCL 4 MG/2ML IJ SOLN
4 MG/2ML | Freq: Four times a day (QID) | INTRAMUSCULAR | Status: DC | PRN
Start: 2019-03-03 — End: 2019-03-05

## 2019-03-03 MED ORDER — ACETAMINOPHEN 325 MG PO TABS
325 MG | Freq: Four times a day (QID) | ORAL | Status: DC | PRN
Start: 2019-03-03 — End: 2019-03-05

## 2019-03-03 MED FILL — PROTONIX 40 MG IV SOLR: 40 mg | INTRAVENOUS | Qty: 80

## 2019-03-03 MED FILL — OXYCODONE-ACETAMINOPHEN 5-325 MG PO TABS: 5-325 mg | ORAL | Qty: 1

## 2019-03-03 MED FILL — PROTONIX 40 MG IV SOLR: 40 mg | INTRAVENOUS | Qty: 40

## 2019-03-03 MED FILL — NORMAL SALINE FLUSH 0.9 % IV SOLN: 0.9 % | INTRAVENOUS | Qty: 10

## 2019-03-03 MED FILL — MUCINEX 600 MG PO TB12: 600 mg | ORAL | Qty: 1

## 2019-03-03 MED FILL — PANTOPRAZOLE SODIUM 40 MG IV SOLR: 40 mg | INTRAVENOUS | Qty: 80

## 2019-03-03 NOTE — Progress Notes (Addendum)
Hospitalist Progress Note      Name:  Dalton Warner DOB/Age/Sex: 09-17-1930  (86 y.o. male)   MRN & CSN:  [id number] & [id number] Admission Date/Time: 03/02/2019  4:31 PM   Location:  1102/1102-A PCP: Dalton Warner, [facility] Day: 2    Assessment and Plan:   Dalton Warner is a 86 y.o.  male  who presents with symptomatic anemia.  His Hb was 6.78 g/dl on admission. He denied melena, hematemesis or brbpr.  He had right shoulder reverse arthroplasty 3 days prior to presentation.  Blood loss during that surgery was estimated to be 300 ml.     -Acute on chronic anemia due to blood loss from recent surgery  No S/S of GI bleed.   Hb was 6.5 g/dL on admission from 8.6 g/dl 2 days prior.   S/p 1 unit of packed RBC with inadequate response.  Hb today up to 7.2 g/dl.   Plan  Transfuse another unit of RBC today.  Monitor H&H for another day.  No procedures at this time per GI.  Last EGD 6 months ago.  See findings below.  Last C-scope 5 years ago.  Anticoagulation currently held.      Other diagnoses, medications resumed unless contraindicated   -Bleeding GEJ polyp treated with norepinephrine injection on EGD September 08 2018.  EGD August 2020 showed ulcer at the gastro esophageal junction. Continue p.o. PPI twice daily.  -MDS  -Chronic thrombocytopenia.  PLT count 45K. Follows up with hematology outpatient  -Type II DM  -Atrial fibrillation.  On chronic anticoagulation with Xarelto (currently held due to acute anemia and significant thrombocytopenia).  I do not believe anticoagulation should be resumed at discharge given significant thrombocytopenia and issues with anemia.  He also needs to establish with a cardiologist locally.  Will ask one of our cardiologists to see.      Diet DIET DENTAL SOFT;   DVT Prophylaxis [x]  SCD   GI Prophylaxis [x]  PPI,  []  H2 Blocker,  []  Carafate,  []  Diet/Tube Feeds   Code Status Full Code   Disposition  back to [place] []  Low, []  Moderate,[]   High      History of Present Illness:   Patient seen and examined.  He feels well.  Hemoglobin improved from 6.5- to 7.2 g/dl after 1 unit of RBC transfusion yesterday.  He denies hematemesis or blood in stools.    Ten point ROS reviewed negative, unless as noted above    Objective:       Intake/Output Summary (Last 24 hours) at 03/03/2019 1328  Last data filed at 03/03/2019 0735  Gross per 24 hour   Intake 1882.5 ml   Output 650 ml   Net 1232.5 ml      Vitals:   Vitals:    03/03/19 0832   BP: 121/63   Pulse: 74   Resp: 18   Temp: 98.4 ??F (36.9 ??C)   SpO2: 95%     Physical Exam:   GEN Awake male, sitting upright in bed.  RESP breathing comfortably on room air.  CARDIO/VASC S1/S2 auscultated. Regular rate without appreciable murmurs. No peripheral edema.  GI Abdomen is soft without significant tenderness, masses, or guarding. Bowel sounds are normoactive.   MSK No gross joint deformities.  SKIN Normal coloration, warm, dry.  NEURO normal speech, no lateralizing weakness.  PSYCH Awake, alert, oriented x 3.    Medications:  Medications:   ??? [START ON 03/04/2019] pantoprazole  40 mg Intravenous BID   ??? sodium chloride flush  10 mL Intravenous 2 times per day   ??? guaiFENesin  600 mg Oral BID      Infusions:   ??? sodium chloride     ??? sodium chloride       PRN Meds:   ???  sodium chloride, , PRN    ???  sodium chloride, , PRN    ???  sodium chloride flush, 10 mL, PRN    ???  promethazine, 12.5 mg, Q6H PRN    Or    ???  ondansetron, 4 mg, Q6H PRN    ???  acetaminophen, 650 mg, Q6H PRN    Or    ???  acetaminophen, 650 mg, Q6H PRN    ???  oxyCODONE-acetaminophen, 1 tablet, Q6H PRN        CBC   Recent Labs     03/02/19  1540 03/03/19  0055   WBC 7.6 7.5   HGB 6.5* 7.2*   HCT 20.9* 22.0*   PLT 50* 45*      BMP   Recent Labs     03/02/19  1540 03/03/19  0055   NA 134* 133*   K 4.0 4.2   CL 100 102   CO2 24 21   BUN 28* 28*   CREATININE 1.1 0.9       Radiology report reviewed     Electronically signed by Darolyn Rua, MD on 03/03/2019 at 1:28 PM

## 2019-03-03 NOTE — Consults (Signed)
[facility]  Gastroenterology Consultation    03/03/2019  12:25 PM    Patient:    Dalton Warner  DOB: 18-Oct-1930   86 y.o.             MRN: [id number]  Admitted: 03/02/2019  4:31 PM ATT: Darolyn Rua, MD   1102/1102-A  AdmitDx: Guaiac positive stools [R19.5]  History of peptic ulcer disease [Z87.11]  Severe anemia [D64.9]  Symptomatic anemia [D64.9]  PCP: Carloyn Manner, APRN - CNP    Reason for Consult:  anemia    IMPRESSION and RECOMMENDATIONS:    ANEMIA  Has Chronic thrombocytopenia  No overt Bleed  RECENT Rt Shoulder arthroplastic  Has constipation - probably sec to narcotics  Drop in hb probably sec to surgery  RECOMMEND:  PPI BID for gastric mucosal protection  Hb 7.2 after one unit of PRBC  Will give one more unit  Start diet  NO GI procedures at this time  Last EGD 6 months ago  Last C scope 5 years ago    Agree with cautious anticoagulation      Patient Active Problem List   Diagnosis Code   ??? Thrombocytopenia ([place]) D69.6   ??? History of arthroplasty of right shoulder Z96.611   ??? Symptomatic anemia D64.9             Requesting Physician:  Darolyn Rua, MD      History Obtained From:  Patient and review of all records    HISTORY OF PRESENT ILLNESS:                The patient is a 86 y.o. male with significant past medical history as below who presents with above mentioned causes in reason for consult.    Admitted with anemia  No over bleed  Has constipation  No abd pain  Recent Rt shoulder arthroplasty    EGD at [facility]:  EGD completed - oozing nodule at ge junction with adherent clot and surface ulceration. Injected with epi, some clot removed. No discrete bleeding area identified to treat with clips. Small biopsy taken. ? Venous blebs vs less likely varices in esophagus     Pain free today        Past Medical History:        Diagnosis Date   ??? Arthritis    ??? Atrial fibrillation ([place])    ??? Bleeding ulcer 08/2018   ??? Cancer [facility])      Prostate cancer dx March 2010- tx with radiation    ??? Colon cancer ([place]) 2000    per old chart dx with colon ca - tx with surgery only   ??? Diabetes mellitus ([place])     "was borderline- at one time was on Metformin for several yrs-  when I went to [facility] they had me stop this in 2017"   ??? Fracture     "had broken neck in Nov 2016- wore a collar for 6 weeks only"   ??? HOH (hard of hearing)     bil hearing aide   ??? Hx of blood clots     "had blood clot - it was a  blood clot in  my esophagus "   ??? Hx of fall     "last fall 2016- use cane walker and have electric chair"   ??? Hyperlipidemia    ??? Hypertension     follow with Dr Freeman Caldron at [facility]   ???  Limited range of motion (ROM) of shoulder     "right shoulder haved 10 % usage and left shoulder 60 % of usage"   ??? Neuropathy     both feet   ??? Sleep apnea     "had sleep study- tried to give me cpap but could not tolerate it "   ??? TIA (transient ischemic attack)     'in Jan 2009- trouble with speech, numbness of lip, weakness right arm - all only lasted for 20 seconds"   ??? Unsteady gait     "hx of back problem- was on steroids for 4 yrs- since then stability issues with my back -    ??? Wears glasses     to read        Past Surgical History:        Procedure Laterality Date   ??? ABDOMEN SURGERY  2000    per old chart had right hemicolectomy "removed 2 and a half feet of colon and my appendix"   ??? COLONOSCOPY  2019   ??? ENDOSCOPY, COLON, DIAGNOSTIC      per old chart had EGD done 08/2018 and 09/2018   ??? HERNIA REPAIR      per old chart open left ing hernia 1980 and then recurrent left ing hernia repair 08/2018   ??? JOINT REPLACEMENT      per old chart total left knee 2018   ??? PACEMAKER PLACEMENT      per old chart hx of st jude pacer insertion 2008- new pacer inserted 2019   ??? SHOULDER SURGERY Left 2015    rota. cuff   ??? SHOULDER SURGERY Right 02/27/2019    RIGHT REVERSE SHOULDER TOTAL ARTHROPLASTY REVERSE performed by Avel Sensor, MD at [facility] OR         Current  Medications:    Medications    Prior to Admission medications    Medication Sig Start Date End Date Taking? Authorizing Provider   oxyCODONE-acetaminophen (PERCOCET) 5-325 MG per tablet Take 1 tablet by mouth every 6 hours as needed for Pain for up to 7 days. Intended supply: 7 days. Take lowest dose possible to manage pain 02/28/19 03/07/19  Avel Sensor, MD   guaiFENesin [facility]) 600 MG extended release tablet Take 1,200 mg by mouth 2 times daily    Historical Provider, MD   acetaminophen (TYLENOL) 500 MG tablet Take 500 mg by mouth every 6 hours as needed for Pain    Historical Provider, MD   alfuzosin (UROXATRAL) 10 MG extended release tablet Take 10 mg by mouth daily    Historical Provider, MD   rivaroxaban (XARELTO) 20 MG TABS tablet Take 20 mg by mouth    Historical Provider, MD   atorvastatin (LIPITOR) 40 MG tablet Take 40 mg by mouth daily    Historical Provider, MD   metoprolol succinate (TOPROL XL) 50 MG extended release tablet Take 50 mg by mouth nightly     Historical Provider, MD   albuterol sulfate (PROAIR RESPICLICK) 123XX123 (90 Base) MCG/ACT aerosol powder inhalation 2 puffs every 6 hours as needed     Historical Provider, MD   loratadine (CLARITIN) 10 MG capsule Take 10 mg by mouth daily     Historical Provider, MD   montelukast (SINGULAIR) 10 MG tablet 10 mg nightly  01/09/18   Historical Provider, MD      Scheduled Medications:   ??? sodium chloride flush  10 mL Intravenous 2 times per day   ??? guaiFENesin  600 mg Oral BID     Infusions:   ??? pantoprozole (PROTONIX) infusion 8 mg/hr (03/03/19 [id number])   ??? sodium chloride     ??? sodium chloride 75 mL/hr at 03/02/19 2229     PRN Medications: sodium chloride, sodium chloride flush, promethazine **OR** ondansetron, acetaminophen **OR** acetaminophen, oxyCODONE-acetaminophen  Allergies:   Allergies   Allergen Reactions   ??? Cat Hair Extract Other (See Comments)     Runny nose   ??? Dust Mite Extract Other (See Comments)     Runny nose   ??? Other    ??? Fish  Allergy Nausea And Vomiting     [place] only         Allergies:  Cat hair extract, Dust mite extract, Other, and Fish allergy    Social History:   TOBACCO:   reports that he has never smoked. He has never used smokeless tobacco.  ETOH:   reports current alcohol use.    Family History:       Problem Relation Age of Onset   ??? Cancer Mother         lung cancer   ??? Diabetes Mother    ??? Diabetes Sister    ??? Cancer Brother    ??? High Blood Pressure Brother      No family history of colon cancer, Crohn's disease, or ulcerative colitis.    REVIEW OF SYSTEMS:      Neg apart from HPI  PHYSICAL EXAM:      Vitals:    BP 121/63    Pulse 74    Temp 98.4 ??F (36.9 ??C) (Oral)    Resp 18    Ht 5\' 7"  (1.702 m)    Wt 167 lb (75.8 kg)    SpO2 95%    BMI 26.16 kg/m??     General Appearance:    Alert, cooperative, no distress, appears stated age. Tight shoulder in in sling   HEENT:    Pale conjunctiva,  gums normal   Neck:   Supple, no JVD, No lymph nodes   Lungs:     Clear to auscultation bilaterally,    Chest Wall:    No tenderness or deformity    Heart:     S1 and S2 normal,   Abdomen:     Soft, non-tender, bowel sounds active all four quadrants,     no masses, no organomegaly, no ascitis   Rectal:    Patient refused   Extremities:  , no cyanosis or edema       Skin:   Skin , no major lesions   Lymph nodes:   No abnormality   Neurologic:   No focal deficits, moving all four extremities            DATA:    ABGs: No results found for: PHART, PO2ART, PCO2ART  CBC:   Recent Labs     03/02/19  1540 03/03/19  0055   WBC 7.6 7.5   HGB 6.5* 7.2*   PLT 50* 45*     BMP:    Recent Labs     03/02/19  1540 03/03/19  0055   NA 134* 133*   K 4.0 4.2   CL 100 102   CO2 24 21   BUN 28* 28*   CREATININE 1.1 0.9   GLUCOSE 153* 120*     Magnesium:   Lab Results   Component Value Date    MG 1.9 03/02/2019     Hepatic:  Recent Labs     03/02/19  1540 03/03/19  0055   AST 22 22   ALT 10 11   BILITOT 0.6 1.0   ALKPHOS 44 40     No results for input(s): LIPASE,  AMYLASE in the last 72 hours.  Recent Labs     03/02/19  1540 03/03/19  0055   PROTIME 18.2* 17.0*   INR 1.50 1.40     No results for input(s): PTT in the last 72 hours.  Lipids: No results for input(s): CHOL, HDL in the last 72 hours.    Invalid input(s): LDLCALCU  INR:   Recent Labs     03/02/19  1540 03/03/19  0055   INR 1.50 1.40     TSH: No results found for: TSH    Intake/Output Summary (Last 24 hours) at 03/03/2019 1225  Last data filed at 03/03/2019 0735  Gross per 24 hour   Intake 1882.5 ml   Output 650 ml   Net 1232.5 ml     ??? pantoprozole (PROTONIX) infusion 8 mg/hr (03/03/19 0653)   ??? sodium chloride     ??? sodium chloride 75 mL/hr at 03/02/19 2229       Imaging Studies  reviewed        Devany Aja  03/03/2019  12:25 PM

## 2019-03-03 NOTE — ED Triage Notes (Addendum)
Report given to Jim Like at nursing home: [phone].

## 2019-03-03 NOTE — ED Triage Notes (Signed)
Report provided to Jim Like at nursing home.

## 2019-03-04 LAB — TYPE AND SCREEN
ABO/Rh: A POS
Antibody Screen: NEGATIVE
Unit Divison: 0

## 2019-03-04 LAB — HEMOGLOBIN AND HEMATOCRIT
Hematocrit: 27.8 % — ABNORMAL LOW (ref 42–52)
Hematocrit: 25.7 % — ABNORMAL LOW (ref 42–52)
Hemoglobin: 9 GM/DL — ABNORMAL LOW (ref 13.5–18.0)
Hemoglobin: 8.2 GM/DL — ABNORMAL LOW (ref 13.5–18.0)

## 2019-03-04 LAB — HEMOGLOBIN: Hemoglobin: 8.9 GM/DL — ABNORMAL LOW (ref 13.5–18.0)

## 2019-03-04 MED ORDER — APIXABAN 2.5 MG PO TABS
2.5 MG | Freq: Two times a day (BID) | ORAL | Status: DC
Start: 2019-03-04 — End: 2019-03-05
  Administered 2019-03-04 – 2019-03-05 (×3): 2.5 mg via ORAL

## 2019-03-04 MED ORDER — POLYETHYLENE GLYCOL 3350 17 G PO PACK
17 g | Freq: Every day | ORAL | Status: DC
Start: 2019-03-04 — End: 2019-03-04
  Administered 2019-03-04: 16:00:00 17 g via ORAL

## 2019-03-04 MED ORDER — POLYETHYLENE GLYCOL 3350 17 G PO PACK
17 g | Freq: Every day | ORAL | Status: DC
Start: 2019-03-04 — End: 2019-03-05
  Administered 2019-03-05: 15:00:00 17 g via ORAL

## 2019-03-04 MED ORDER — POLYETHYLENE GLYCOL 3350 17 G PO PACK
17 g | Freq: Two times a day (BID) | ORAL | Status: DC
Start: 2019-03-04 — End: 2019-03-04

## 2019-03-04 MED FILL — OXYCODONE-ACETAMINOPHEN 5-325 MG PO TABS: 5-325 mg | ORAL | Qty: 1

## 2019-03-04 MED FILL — MUCINEX 600 MG PO TB12: 600 mg | ORAL | Qty: 1

## 2019-03-04 MED FILL — POLYETHYLENE GLYCOL 3350 17 G PO PACK: 17 g | ORAL | Qty: 1

## 2019-03-04 MED FILL — ELIQUIS 2.5 MG PO TABS: 2.5 mg | ORAL | Qty: 1

## 2019-03-04 MED FILL — PROTONIX 40 MG IV SOLR: 40 mg | INTRAVENOUS | Qty: 40

## 2019-03-04 NOTE — Consults (Signed)
INPATIENT CARDIOLOGY CONSULT NOTE       Reason for consultation:  Anemia, ? Anticoagulation     Referring physician:  London Pepper, MD     Primary care physician: Carloyn Manner, APRN - CNP      Dear London Pepper, MD Thank you for the consult    Chief Complaint   Patient presents with   ??? Abnormal Lab     Reports low hemoglobin from labs drawn today       History of present illness:Dalton Warner is a 86 y.o.year old who  presents with  Chief Complaint   Patient presents with   ??? Abnormal Lab     Reports low hemoglobin from labs drawn today       Patient is 86 year old male with prior medical history significant for chronic atrial fibrillation, history of pacemaker for sick sinus syndrome, history of hypertension hyperlipidemia, history of TIA, diabetes mellitus, prior history of LV thrombus per history presents to the hospital with symptomatic anemia.    Patient recently underwent shoulder surgery after which she was noted to have low hemoglobin.  Patient also has history of peptic ulcer disease and previously underwent EGD and colonoscopy.    Cardiologist consulted to evaluate patient for oral anticoagulation management.    Patient currently denies any chest pain shortness of breath or palpitation Dalton Warner appears to be at baseline.  Dalton Warner has currently moved in from [place] to take care of his wife who has dementia and is in a nursing home locally.    Recent EKG shows atrial fibrillation with intermittent ventricular paced beats      Past medical history:    has a past medical history of Arthritis, Atrial fibrillation ([place]), Bleeding ulcer, Cancer ([place]), Colon cancer ([place]), Diabetes mellitus ([place]), Fracture, HOH (hard of hearing), Hx of blood clots, Hx of fall, Hyperlipidemia, Hypertension, Limited range of motion (ROM) of shoulder, Neuropathy, Sleep apnea, TIA (transient ischemic attack), Unsteady gait, and Wears glasses.  Past surgical history:   has a past surgical history that includes shoulder surgery (Left,  2015); Colonoscopy (2019); pacemaker placement; hernia repair; Endoscopy, colon, diagnostic; Abdomen surgery (2000); joint replacement; and shoulder surgery (Right, 02/27/2019).  Social History:   reports that Dalton Warner has never smoked. Dalton Warner has never used smokeless tobacco. Dalton Warner reports current alcohol use. Dalton Warner reports that Dalton Warner does not use drugs.  Family history:   no family history of CAD, STROKE of DM    Allergies   Allergen Reactions   ??? Cat Hair Extract Other (See Comments)     Runny nose   ??? Dust Mite Extract Other (See Comments)     Runny nose   ??? Other    ??? Fish Allergy Nausea And Vomiting     [place] only         ???  [START ON 03/05/2019] polyethylene glycol (GLYCOLAX) packet 17 g, Daily    ???  pantoprazole (PROTONIX) injection 40 mg, BID    ???  0.9 % sodium chloride infusion, PRN    ???  sodium chloride flush 0.9 % injection 10 mL, 2 times per day    ???  sodium chloride flush 0.9 % injection 10 mL, PRN    ???  promethazine (PHENERGAN) tablet 12.5 mg, Q6H PRN    Or    ???  ondansetron (ZOFRAN) injection 4 mg, Q6H PRN    ???  acetaminophen (TYLENOL) tablet 650 mg, Q6H PRN    Or    ???  acetaminophen (  TYLENOL) suppository 650 mg, Q6H PRN    ???  oxyCODONE-acetaminophen (PERCOCET) 5-325 MG per tablet 1 tablet, Q6H PRN    ???  guaiFENesin (MUCINEX) extended release tablet 600 mg, BID      Current Facility-Administered Medications   Medication Dose Route Frequency Provider Last Rate Last Admin   ??? [START ON 03/05/2019] polyethylene glycol (GLYCOLAX) packet 17 g  17 g Oral Daily Darolyn Rua, MD       ??? pantoprazole (PROTONIX) injection 40 mg  40 mg Intravenous BID Darolyn Rua, MD   40 mg at 03/04/19 1029   ??? 0.9 % sodium chloride infusion   Intravenous PRN Lincoln Brigham, MD       ??? sodium chloride flush 0.9 % injection 10 mL  10 mL Intravenous 2 times per day Carolee Rota, APRN - CNP   10 mL at 03/04/19 1029   ??? sodium chloride flush 0.9 % injection 10 mL  10 mL Intravenous PRN Carolee Rota, APRN - CNP       ??? promethazine (PHENERGAN)  tablet 12.5 mg  12.5 mg Oral Q6H PRN Carolee Rota, APRN - CNP        Or   ??? ondansetron (ZOFRAN) injection 4 mg  4 mg Intravenous Q6H PRN Carolee Rota, APRN - CNP       ??? acetaminophen (TYLENOL) tablet 650 mg  650 mg Oral Q6H PRN Carolee Rota, APRN - CNP        Or   ??? acetaminophen (TYLENOL) suppository 650 mg  650 mg Rectal Q6H PRN Carolee Rota, APRN - CNP       ??? oxyCODONE-acetaminophen (PERCOCET) 5-325 MG per tablet 1 tablet  1 tablet Oral Q6H PRN Carolee Rota, APRN - CNP   1 tablet at 03/04/19 0258   ??? guaiFENesin (MUCINEX) extended release tablet 600 mg  600 mg Oral BID Jamelle Haring, APRN - NP   600 mg at 03/04/19 1029         Review of Systems:     ?? Constitutional: No Fever or Weight Loss   ?? Eyes: No Decreased Vision  ?? ENT: No Headaches, Hearing Loss or Vertigo  ?? Cardiovascular: No chest pain, dyspnea on exertion, palpitations or loss of consciousness  ?? Respiratory: No cough or wheezing    ?? Gastrointestinal: No abdominal pain, appetite loss, blood in stools, constipation, diarrhea or heartburn  ?? Genitourinary: No dysuria, trouble voiding, or hematuria  ?? Musculoskeletal:  No gait disturbance, weakness or joint complaints  ?? Integumentary: No rash or pruritis  ?? Neurological: No TIA or stroke symptoms  ?? Psychiatric: No anxiety or depression  ?? Endocrine: No malaise, fatigue or temperature intolerance  ?? Hematologic/Lymphatic: No bleeding problems, blood clots or swollen lymph nodes  ?? Allergic/Immunologic: No nasal congestion or hives    All other systems were reviewed and were negative otherwise.         Physical Examination:      Vitals:    03/04/19 0427   BP: 120/71   Pulse: 99   Resp: 16   Temp: 98.4 ??F (36.9 ??C)   SpO2: 96%      Wt Readings from Last 3 Encounters:   03/04/19 168 lb (76.2 kg)   02/27/19 165 lb (74.8 kg)   02/20/19 165 lb (74.8 kg)     Body mass index is 26.31 kg/m??.    General Appearance:  No distress, conversant  Constitutional:  Well developed, Well nourished  HEENT:  Normocephalic,  Atraumatic, Oropharynx  moist, No oral exudates,   Nose normal. Neck Supple Carotid: no carotid bruit  Eyes:  Conjunctiva normal, No discharge.   Respiratory:    Normal breath sounds, No respiratory distress, No wheezing, no use of accessory muscles, diaphragm movement is normal  No chest Tenderness  Cardiovascular: IRIR S1-S2 No murmurs auscultated. No rubs, thrills or gallops.  Pedal pulses are normal. No pedal edema  GI:  Soft Non tender, non distended.   GU:  no CVA tenderness.   Musculoskeletal:   No tenderness, No cyanosis, No clubbing.   Integument:  Warm, Dry, No erythema, No rash.   Lymphatic:  No lymphadenopathy noted.   Neurologic:  Alert & oriented x 3  No focal deficits noted.   Psychiatric:  Affect normal, Judgment normal, Mood normal.       Lab Review     Recent Labs     03/03/19  0055 03/03/19  0055 03/04/19  0117 03/04/19  1205   WBC 7.5  --   --   --    HGB 7.2*   < > 8.2* 8.9*   HCT 22.0*   < > 25.7*  --    PLT 45*  --   --   --     < > = values in this interval not displayed.      Recent Labs     03/03/19  0055   NA 133*   K 4.2   CL 102   CO2 21   BUN 28*   CREATININE 0.9     Recent Labs     03/03/19  0055   AST 22   ALT 11   BILITOT 1.0   ALKPHOS 40     No results for input(s): TROPONINI in the last 72 hours.  No results found for: BNP  Lab Results   Component Value Date    INR 1.40 03/03/2019    PROTIME 17.0 (H) 03/03/2019         All labs, images, EKGs were personally reviewed      Assessment: 86 y.o.year old with PMH of  has a past medical history of Arthritis, Atrial fibrillation ([place]), Bleeding ulcer, Cancer ([place]), Colon cancer ([place]), Diabetes mellitus ([place]), Fracture, HOH (hard of hearing), Hx of blood clots, Hx of fall, Hyperlipidemia, Hypertension, Limited range of motion (ROM) of shoulder, Neuropathy, Sleep apnea, TIA (transient ischemic attack), Unsteady gait, and Wears glasses.       Recommendations:      1. Chronic atrial fibrillation, with history of TIA as well as LV thrombus.   CHA2DS2-VASc score is > 6.  Will start patient on low-dose Eliquis understanding high risk of ischemic stroke versus bleeding risk.  2. Anemia as well as some thrombocytopenia.  Patient evaluated by GI.  Anemia is presumed to be surgical blood loss. Cont to monitor. Pt has ch. Thrombocytopenia and can be observed on [place].   3. HTN: Cont home meds  4. SSS s.p PPM . Follow up with pacer clinic as outpt        Thank you for the consult    Dr. Fannie Knee  03/04/2019 1:33 PM

## 2019-03-04 NOTE — Progress Notes (Addendum)
Hospitalist Progress Note      Name:  ROPE BRIETZKE DOB/Age/Sex: 28-Apr-1930  (86 y.o. male)   MRN & CSN:  [id number] & [id number] Admission Date/Time: 03/02/2019  4:31 PM   Location:  1102/1102-A PCP: Carloyn Manner, [place] [facility] Day: 3    Assessment and Plan:   NERMIN BIELAK is a 86 y.o.  male  who presents with symptomatic anemia.  His Hb was 6.78 g/dl on admission. He denied melena, hematemesis or brbpr.  He had right shoulder reverse arthroplasty 3 days prior to presentation.  Blood loss during that surgery was estimated to be 300 ml.     -Acute on chronic anemia due to blood loss from recent surgery  No S/S of GI bleed.   Hb was 6.5 g/dL on admission, down from 8.6 g/dl 2 days prior.   S/p 2 unit of packed RBC. Last Hb 8.2 g/dl.   Plan  No procedures at this time per GI.    Last EGD 6 months ago.  See findings below.    Last C-scope 5 years ago.  Anticoagulation currently held.  Monitor Hb for another 24 hrs.    --Constipation - Miralax ordered.    Other diagnoses, medications resumed unless contraindicated   -Bleeding GEJ polyp treated with norepinephrine injection on EGD September 08 2018.  EGD August 2020 showed ulcer at the gastro esophageal junction. Continue p.o. PPI twice daily.  -MDS  -Chronic thrombocytopenia.  PLT count 45K. Follows up with hematology outpatient  -Type II DM  -Atrial fibrillation.  On chronic anticoagulation with Xarelto (currently held due to acute anemia and significant thrombocytopenia).  I do not believe anticoagulation should be resumed at discharge given significant thrombocytopenia and issues with anemia.  I have advised patient to not resume Xarelto until he follows up with cardiologist in office.    Diet DIET DENTAL SOFT;   DVT Prophylaxis [x]  SCD   GI Prophylaxis [x]  PPI,  []  H2 Blocker,  []  Carafate,  []  Diet/Tube Feeds   Code Status Full Code   Disposition  back to [facility] tomorrow. Monitor Hb for another day.   MDM []  Low, []  Moderate,[]   High      History of Present Illness:   Patient seen and examined.  He feels well.  Hemoglobin is stable at improved from 6.5- to 7.2 g/dl after 1 unit of RBC transfusion yesterday.  He denies hematemesis or blood in stools.    Ten point ROS reviewed negative, unless as noted above    Objective:       Intake/Output Summary (Last 24 hours) at 03/04/2019 1058  Last data filed at 03/04/2019 0251  Gross per 24 hour   Intake 348 ml   Output 350 ml   Net -2 ml      Vitals:   Vitals:    03/04/19 0427   BP: 120/71   Pulse: 99   Resp: 16   Temp: 98.4 ??F (36.9 ??C)   SpO2: 96%     Physical Exam:   GEN Awake male, sitting upright in bed.  RESP breathing comfortably on room air.  CARDIO/VASC S1/S2 auscultated. Regular rate without appreciable murmurs. No peripheral edema.  GI Abdomen is soft without significant tenderness, masses, or guarding. Bowel sounds are normoactive.   MSK Rt arm in a sling. Wound dressing over the Rt shoulder.  SKIN Normal coloration, warm, dry.  NEURO normal speech, no lateralizing weakness.  PSYCH Awake, alert, oriented x 3.    Medications:   Medications:   ??? polyethylene glycol  17 g Oral Daily   ??? pantoprazole  40 mg Intravenous BID   ??? sodium chloride flush  10 mL Intravenous 2 times per day   ??? guaiFENesin  600 mg Oral BID      Infusions:   ??? sodium chloride       PRN Meds:   ???  sodium chloride, , PRN    ???  sodium chloride flush, 10 mL, PRN    ???  promethazine, 12.5 mg, Q6H PRN    Or    ???  ondansetron, 4 mg, Q6H PRN    ???  acetaminophen, 650 mg, Q6H PRN    Or    ???  acetaminophen, 650 mg, Q6H PRN    ???  oxyCODONE-acetaminophen, 1 tablet, Q6H PRN        CBC   Recent Labs     03/02/19  1540 03/03/19  0055 03/03/19  1327 03/03/19  2047 03/04/19  0117   WBC 7.6 7.5  --   --   --    HGB 6.5* 7.2* 7.5* 9.0* 8.2*   HCT 20.9* 22.0* 24.0* 27.8* 25.7*   PLT 50* 45*  --   --   --       BMP   Recent Labs     03/02/19  1540 03/03/19  0055   NA 134* 133*   K 4.0 4.2   CL 100 102   CO2 24 21   BUN 28* 28*   CREATININE 1.1 0.9        Radiology report reviewed     Electronically signed by Darolyn Rua, MD on 03/04/2019 at 10:58 AM

## 2019-03-04 NOTE — Progress Notes (Signed)
[facility]        Progress Note       03/04/2019  9:13 AM    Patient:    Dalton Warner  DOB: 08-05-1930   86 y.o.             MRN: [id number]  Admitted: 03/02/2019  4:31 PM ATT: Darolyn Rua, MD   1102/1102-A  AdmitDx: Guaiac positive stools [R19.5]  History of peptic ulcer disease [Z87.11]  Severe anemia [D64.9]  Symptomatic anemia [D64.9]  PCP: Carloyn Manner, APRN - CNP    SUBJECTIVE:  Chart reviewed, events noted  Patient feeling well.  No complaints. Tolerating diet. Denies N/V or abd pain. Last BM 5 days ago.     ROS:  The positive ROS will be identified in bold     CONSTITUTIONAL:  Neg  Weight loss, fatigue, fever  MOUTH/THROAT:  Neg  Bleeding gums, hoarseness or sore throat  RESPIRATORY:   Neg SOB, wheeze, cough, hemoptysis or bronchitis  CARDIOVASCULAR:  Neg Chest pain, palpitations, dyspnea on exertion, edema  GASTROINTESTINAL:  SEE HPI  HEMATOLOGIC/LYMPHATIC:  Neg  Anemia, bleeding tendency  MUSCULOSKELETAL: Neg  New myalgias, joint pain, swelling or stiffness  NEUROLOGICAL:  Neg  Loss of Consciousness, memory loss, forgetfulness, periods of confusion, difficulty concentrating, seizures, insomnia, aphasia    SKIN:  Neg No itching, rashes, or sores  PSYCHIATRIC:  Neg Depression, personality changes, anxiety    OBJECTIVE:      BP 120/71    Pulse 99    Temp 98.4 ??F (36.9 ??C) (Oral)    Resp 16    Ht 5\' 7"  (1.702 m)    Wt 168 lb (76.2 kg)    SpO2 96%    BMI 26.31 kg/m??     NAD, appears comfortable on side of bed  Lips and mucous membranes pink and moist  RRR, Nl s1s2  Lungs CTA bilaterally, respirations even and unlabored   Abdomen soft, ND, NT, bowel sounds normal  Skin pink, warm and dry  No edema bilateral lower extremities   AAOx3    CBC:   Recent Labs     03/02/19  1540 03/03/19  0055 03/03/19  1327 03/03/19  2047 03/04/19  0117   WBC 7.6 7.5  --   --   --    HGB 6.5* 7.2* 7.5* 9.0* 8.2*   PLT 50* 45*  --   --   --      BMP:     Recent Labs     03/02/19  1540 03/03/19  0055   NA 134* 133*   K 4.0 4.2   CL 100 102   CO2 24 21   BUN 28* 28*   CREATININE 1.1 0.9   GLUCOSE 153* 120*     Magnesium:   Lab Results   Component Value Date    MG 1.9 03/02/2019     Hepatic:   Recent Labs     03/02/19  1540 03/03/19  0055   AST 22 22   ALT 10 11   BILITOT 0.6 1.0   ALKPHOS 44 40     INR:   Recent Labs     03/02/19  1540 03/03/19  0055   INR 1.50 1.40         Intake/Output Summary (Last 24 hours) at 03/04/2019 0913  Last data filed at 03/04/2019 0251  Gross per 24 hour   Intake 348 ml   Output 350 ml  Net -2 ml         Medications    Scheduled Medications:   ??? pantoprazole  40 mg Intravenous BID   ??? sodium chloride flush  10 mL Intravenous 2 times per day   ??? guaiFENesin  600 mg Oral BID     PRN Medications: sodium chloride, sodium chloride flush, promethazine **OR** ondansetron, acetaminophen **OR** acetaminophen, oxyCODONE-acetaminophen  Infusions:   ??? sodium chloride             Patient Active Problem List   Diagnosis Code   ??? Thrombocytopenia ([place]) D69.6   ??? History of arthroplasty of right shoulder Z96.611   ??? Symptomatic anemia D64.9       IMPRESSION and RECOMMENDATIONS:  ??  ANEMIA  Hb 8.2 s/p 2 units PRBC  Has Chronic thrombocytopenia- 45  No overt Bleed  RECENT Rt Shoulder arthroplastic  Has constipation - probably sec to narcotics  Drop in hb probably sec to surgery      RECOMMEND:  PPI BID for gastric mucosal protection   Continue diet   CBC daily  Transfuse to keep hgb > 7.5  Start miralax     NO GI procedures at this time  Last EGD 6 months ago at [place] polyp treated with norepinephrine injection on EGD September 08 2018  [place] C [place] 5 years ago in [place]  ??  Agree with cautious anticoagulation    Discussed plan of care with patient and RN     Patient clinical, biochemical, and radiological information discussed with Dr. Franchot Mimes.  He agrees with the assessment and plan.     Evon Slack, CNP  03/04/2019  9:13 AM      No abd issues  No  BM  NO GI bleed  Treat symptomatically  May have advance liver disease  Cautious anticoagulation      I have seen and examined this patient personally, and independently of the nurse practitioner.    The plan was developed mutually at the time of the visit with the patient.  Evon Slack and myself have spoken with patient, nursing staff and provided written and verbal instructions .    The above note has been reviewed and I agree with the Assessment,  Diagnosis, and Treatment plan as suggested by Evon Slack CNP      [place] [facility]

## 2019-03-05 LAB — CBC
Hematocrit: 27.8 % — ABNORMAL LOW (ref 42–52)
Hemoglobin: 9 GM/DL — ABNORMAL LOW (ref 13.5–18.0)
MCH: 29.5 PG (ref 27–31)
MCHC: 32.4 % (ref 32.0–36.0)
MCV: 91.1 FL (ref 78–100)
MPV: 12.1 FL — ABNORMAL HIGH (ref 7.5–11.1)
Platelets: 82 10*3/uL — ABNORMAL LOW (ref 140–440)
RBC: 3.05 M/CU MM — ABNORMAL LOW (ref 4.6–6.2)
RDW: 17.3 % — ABNORMAL HIGH (ref 11.7–14.9)
WBC: 6.6 10*3/uL (ref 4.0–10.5)

## 2019-03-05 LAB — COVID-19: SARS-CoV-2, NAAT: NOT DETECTED

## 2019-03-05 MED ORDER — APIXABAN 2.5 MG PO TABS
2.5 MG | ORAL_TABLET | Freq: Two times a day (BID) | ORAL | Status: DC
Start: 2019-03-05 — End: 2019-03-29

## 2019-03-05 MED ORDER — BISACODYL 5 MG PO TBEC
5 MG | Freq: Once | ORAL | Status: AC
Start: 2019-03-05 — End: 2019-03-05
  Administered 2019-03-05: 15:00:00 10 mg via ORAL

## 2019-03-05 MED ORDER — PANTOPRAZOLE SODIUM 40 MG PO TBEC
40 MG | ORAL_TABLET | Freq: Two times a day (BID) | ORAL | 2 refills | Status: AC
Start: 2019-03-05 — End: ?

## 2019-03-05 MED ORDER — OXYCODONE-ACETAMINOPHEN 5-325 MG PO TABS
5-325 MG | ORAL_TABLET | Freq: Four times a day (QID) | ORAL | 0 refills | Status: AC | PRN
Start: 2019-03-05 — End: 2019-03-08

## 2019-03-05 MED FILL — PROTONIX 40 MG IV SOLR: 40 mg | INTRAVENOUS | Qty: 40

## 2019-03-05 MED FILL — OXYCODONE-ACETAMINOPHEN 5-325 MG PO TABS: 5-325 mg | ORAL | Qty: 1

## 2019-03-05 MED FILL — POLYETHYLENE GLYCOL 3350 17 G PO PACK: 17 g | ORAL | Qty: 1

## 2019-03-05 MED FILL — ELIQUIS 2.5 MG PO TABS: 2.5 mg | ORAL | Qty: 1

## 2019-03-05 MED FILL — MUCINEX 600 MG PO TB12: 600 mg | ORAL | Qty: 1

## 2019-03-05 MED FILL — BISACODYL EC 5 MG PO TBEC: 5 mg | ORAL | Qty: 2

## 2019-03-05 NOTE — Progress Notes (Signed)
[facility]        Progress Note       03/05/2019  8:29 AM    Patient:    Dalton Warner  DOB: March 19, 1930   86 y.o.             MRN: [id number]  Admitted: 03/02/2019  4:31 PM ATT: Darolyn Rua, MD   1102/1102-A  AdmitDx: Guaiac positive stools [R19.5]  History of peptic ulcer disease [Z87.11]  Severe anemia [D64.9]  Symptomatic anemia [D64.9]  PCP: Carloyn Manner, APRN - CNP    SUBJECTIVE:  Chart reviewed, events noted  Patient feeling well. R arm swollen overnight, in sling, not swollen this am. No BM overnight. Eating well. Denies N/V or abd pain.     ROS:  The positive ROS will be identified in bold     CONSTITUTIONAL:  Neg  Weight loss, fatigue, fever  MOUTH/THROAT:  Neg  Bleeding gums, hoarseness or sore throat  RESPIRATORY:   Neg SOB, wheeze, cough, hemoptysis or bronchitis  CARDIOVASCULAR:  Neg Chest pain, palpitations, dyspnea on exertion, edema  GASTROINTESTINAL:  SEE HPI  HEMATOLOGIC/LYMPHATIC:  Neg  Anemia, bleeding tendency  MUSCULOSKELETAL: Neg  New myalgias, joint pain, swelling or stiffness  NEUROLOGICAL:  Neg  Loss of Consciousness, memory loss, forgetfulness, periods of confusion, difficulty concentrating, seizures, insomnia, aphasia    SKIN:  Neg No itching, rashes, or sores  PSYCHIATRIC:  Neg Depression, personality changes, anxiety    OBJECTIVE:      BP (!) 141/75    Pulse 90    Temp 98.6 ??F (37 ??C) (Oral)    Resp 18    Ht 5\' 7"  (1.702 m)    Wt 169 lb (76.7 kg)    SpO2 95%    BMI 26.47 kg/m??     NAD, appears comfortable  Lips and mucous membranes pink and moist  RRR, Nl s1s2  Lungs CTA bilaterally, respirations even and unlabored   Abdomen soft, ND, NT, bowel sounds normal  Skin pink, warm and dry  No edema bilateral lower extremities   AAOx3    CBC:   Recent Labs     03/02/19  1540 03/03/19  0055 03/03/19  0055 03/03/19  2047 03/04/19  0117 03/04/19  1205   WBC 7.6 7.5  --   --   --   --    HGB 6.5* 7.2*   < > 9.0* 8.2*  8.9*   PLT 50* 45*  --   --   --   --     < > = values in this interval not displayed.     BMP:    Recent Labs     03/02/19  1540 03/03/19  0055   NA 134* 133*   K 4.0 4.2   CL 100 102   CO2 24 21   BUN 28* 28*   CREATININE 1.1 0.9   GLUCOSE 153* 120*     Magnesium:   Lab Results   Component Value Date    MG 1.9 03/02/2019     Hepatic:   Recent Labs     03/02/19  1540 03/03/19  0055   AST 22 22   ALT 10 11   BILITOT 0.6 1.0   ALKPHOS 44 40     INR:   Recent Labs     03/02/19  1540 03/03/19  0055   INR 1.50 1.40         Intake/Output Summary (Last  24 hours) at 03/05/2019 0829  Last data filed at 03/05/2019 0451  Gross per 24 hour   Intake --   Output 700 ml   Net -700 ml         Medications    Scheduled Medications:   ??? polyethylene glycol  17 g Oral Daily   ??? apixaban  2.5 mg Oral BID   ??? pantoprazole  40 mg Intravenous BID   ??? sodium chloride flush  10 mL Intravenous 2 times per day   ??? guaiFENesin  600 mg Oral BID     PRN Medications: sodium chloride, sodium chloride flush, promethazine **OR** ondansetron, acetaminophen **OR** acetaminophen, oxyCODONE-acetaminophen  Infusions:   ??? sodium chloride             Patient Active Problem List   Diagnosis Code   ??? Thrombocytopenia ([place]) D69.6   ??? History of arthroplasty of right shoulder Z96.611   ??? Severe anemia D64.9     IMPRESSION and??RECOMMENDATIONS:  ??  ANEMIA  Hb 8.9 s/p 2 units PRBC  Has Chronic thrombocytopenia- 45  No overt Bleed  RECENT Rt Shoulder arthroplastic  Has constipation - probably sec to narcotics  Drop in hb probably sec to surgery  May have Liver disease (Low Plt & Albumin)  ??    RECOMMEND:  PPI BID for gastric mucosal protection   Continue diet   CBC daily  Transfuse to keep hgb > 7.5  Continue miralax  Start Dulcolax   ??  NO GI procedures at this time  Last EGD 6 months ago at [place] polyp treated with norepinephrine injection on EGD September 08 2018  Last C scope 5 years ago in [place]  ??  Agree with cautious anticoagulation    Discussed plan of  care with patient and hospitalist    Patient clinical, biochemical, and radiological information discussed with Dr. Franchot Mimes.  He agrees with the assessment and plan.     Evon Slack, CNP  03/05/2019  8:29 AM     HB stable  FU once shoulder recovered  May have Cirrhosis  Further evaluation as op    I have seen and examined this patient personally, and independently of the nurse practitioner.    The plan was developed mutually at the time of the visit with the patient.  Evon Slack and myself have spoken with patient, nursing staff and provided written and verbal instructions .    The above note has been reviewed and I agree with the Assessment,  Diagnosis, and Treatment plan as suggested by Evon Slack CNP      [place] [facility]

## 2019-03-05 NOTE — Discharge Summary (Signed)
Discharge Summary    Name:  Dalton Warner DOB/Age/Sex: 21-Jul-1930  (86 y.o. male)   MRN & CSN:  [id number] & [id number] Admission Date/Time: 03/02/2019  4:31 PM   Attending:  Darolyn Rua, MD Discharging Physician: Darolyn Rua, MD     Hospital Course:   Dalton Warner is a 86 y.o.  male  who presents with symptomatic anemia.  His Hb was 6.78 g/dl on admission. He denied melena, hematemesis or brbpr.  He had right shoulder reverse arthroplasty 3 days prior to presentation.  Blood loss during that surgery was estimated to be 300 ml.   ??  -Acute on chronic anemia due to blood loss from recent surgery  No S/S of GI bleed.   Hb was 6.5 g/dL on admission, down from 8.6 g/dl 2 days prior.   S/p 2 unit of packed RBC.  Hemoglobin remained stable posttransfusion.  Hb at discharge 9 g/dl.  On the day of discharge he denied shortness of breath, chest pain, abdominal pain.  Reports that he felt much better than he was on admission.  No evidence of active bleeding from shoulder surgery site.    -History of upper GI bleed  GI were consulted on admission but given that current anemia was thought to be from recent shoulder surgery, no procedures were indicated.  GI advised that he continue Protonix 40 mg twice daily and follow-up in GI clinic.  Last EGD 6 months ago [see below]. Last C-scope 5 years ago.  ??    Other diagnoses, medications resumed unless contraindicated   -Bleeding GEJ polyp treated with norepinephrine injection on EGD September 08 2018.  EGD August 2020 showed ulcer at the gastro esophageal junction. Continue p.o. PPI twice daily.  -MDS  -Chronic thrombocytopenia.  PLT count 82K. Follows up with hematology outpatient  -Type II DM  -Atrial fibrillation with prior history of LV thrombus.  On chronic anticoagulation with Xarelto.  Given current anemia and thrombocytopenia, cardiology reviewed his anticoagulant and advised that Xarelto be switched to low-dose apixaban.    The patient expressed appropriate  understanding of and agreement with the discharge recommendations, medications, and plan.     Consults this admission:  IP CONSULT TO HOSPITALIST  IP CONSULT TO GI  IP CONSULT TO CARDIOLOGY    Discharge Instruction:   Follow up appointments:  Cardiology  Hematology  Gastroenterology    Diet:  regular diet   Activity: activity as tolerated  Disposition: Discharged to:   [] Home, [] HHC, [x] SNF, [] Acute Rehab, [] Hospice  Condition on discharge: Stable    Discharge Medications:      Dalton Warner, Dalton "Bill"   Home Medication Instructions [id number]    Printed on:03/05/19 0955   Medication Information                      acetaminophen (TYLENOL) 500 MG tablet  Take 500 mg by mouth every 6 hours as needed for Pain             albuterol sulfate (PROAIR RESPICLICK) 123XX123 (90 Base) MCG/ACT aerosol powder inhalation  2 puffs every 6 hours as needed              alfuzosin (UROXATRAL) 10 MG extended release tablet  Take 10 mg by mouth daily             apixaban (ELIQUIS) 2.5 MG TABS tablet  Take 1 tablet by mouth 2 times daily  atorvastatin (LIPITOR) 40 MG tablet  Take 40 mg by mouth daily             guaiFENesin (MUCINEX) 600 MG extended release tablet  Take 1,200 mg by mouth 2 times daily             loratadine (CLARITIN) 10 MG capsule  Take 10 mg by mouth daily              metoprolol succinate (TOPROL XL) 50 MG extended release tablet  Take 50 mg by mouth nightly              montelukast (SINGULAIR) 10 MG tablet  10 mg nightly              oxyCODONE-acetaminophen (PERCOCET) 5-325 MG per tablet  Take 1 tablet by mouth every 6 hours as needed for Pain for up to 3 days.             pantoprazole (PROTONIX) 40 MG tablet  Take 1 tablet by mouth 2 times daily (before meals)                 Objective Findings at Discharge:   BP (!) 141/75    Pulse 90    Temp 98.6 ??F (37 ??C) (Oral)    Resp 18    Ht 5\' 7"  (1.702 m)    Wt 169 lb (76.7 kg)    SpO2 95%    BMI 26.47 kg/m??            PHYSICAL EXAM   GEN    Awake male,  sitting upright in bed.  RESP  breathing comfortably on room air.  CARDIO/VASC           S1/S2 auscultated. Regular rate without appreciable murmurs. No peripheral edema.  GI        Abdomen is soft without significant tenderness, masses, or guarding. Bowel sounds are normoactive.   MSK Rt arm in a sling. Wound dressing over the Rt shoulder, clean and dry.  SKIN    Normal coloration, warm, dry.  NEURO           normal speech, no lateralizing weakness.  PSYCH            Awake, alert, oriented x 3.  ??  BMP/CBC  Recent Labs     03/02/19  1540 03/03/19  0055 03/03/19  0055 03/03/19  2047 03/04/19  0117 03/05/19  0735   NA 134* 133*  --   --   --   --    K 4.0 4.2  --   --   --   --    CL 100 102  --   --   --   --    CO2 24 21  --   --   --   --    BUN 28* 28*  --   --   --   --    CREATININE 1.1 0.9  --   --   --   --    WBC 7.6 7.5  --   --   --  6.6   HCT 20.9* 22.0*   < > 27.8* 25.7* 27.8*   PLT 50* 45*  --   --   --  82*    < > = values in this interval not displayed.       IMAGING:  As above    Discharge Time of 35 minutes    Electronically signed by Malva Cogan  Marny Smethers, MD on 03/05/2019 at 9:55 AM

## 2019-03-05 NOTE — Care Coordination-Inpatient (Signed)
Discharge order noted. CM spoke with Nira Conn at [facility] and pt can return today. Transport set up w/ [employer] for 12:45pm. Updated primary RN Software engineer at [facility]. Discharge packet prepared.

## 2019-03-05 NOTE — Progress Notes (Addendum)
Cardiology Progress Note     Today's Plan: Okay for discharge    Admit Date:  03/02/2019    Consult reason/ Seen today for: Anticoagulation    Subjective and  Overnight Events: Patient denies any chest pain, dizziness, shortness of breath, or palpitations.      Assessment / Plan / Recommendation:       1. Chronic atrial fibrillation, with history of TIA as well as LV thrombus.  CHA2DS2-VASc score is > 6.  Will start patient on low-dose Eliquis understanding high risk of ischemic stroke versus bleeding risk.  2. Anemia as well as some thrombocytopenia.  Patient evaluated by GI.  Anemia is presumed to be surgical blood loss. Cont to monitor. Pt has ch. Thrombocytopenia and can be observed on [place].   3. HTN: Cont home meds  4. SSS s.p PPM . Follow up with pacer clinic as outpt   5. Patient follow-up as outpatient        History of Presenting Illness:    Chief complain on admission : 86 y.o.year old who is admitted for  Chief Complaint   Patient presents with   ??? Abnormal Lab     Reports low hemoglobin from labs drawn today        Past medical history:    has a past medical history of Arthritis, Atrial fibrillation ([place]), Bleeding ulcer, Cancer ([place]), Colon cancer ([facility]), Diabetes mellitus ([place]), Fracture, HOH (hard of hearing), Hx of blood clots, Hx of fall, Hyperlipidemia, Hypertension, Limited range of motion (ROM) of shoulder, Neuropathy, Sleep apnea, TIA (transient ischemic attack), Unsteady gait, and Wears glasses.  Past surgical history:   has a past surgical history that includes shoulder surgery (Left, 2015); Colonoscopy (2019); pacemaker placement; hernia repair; Endoscopy, colon, diagnostic; Abdomen surgery (2000); joint replacement; and shoulder surgery (Right, 02/27/2019).  Social History:   reports that he has never smoked. He has never used smokeless tobacco. He reports current alcohol use. He reports that he does not use  drugs.  Family history:  family history includes Cancer in his brother and mother; Diabetes in his mother and sister; High Blood Pressure in his brother.    Allergies   Allergen Reactions   ??? Cat Hair Extract Other (See Comments)     Runny nose   ??? Dust Mite Extract Other (See Comments)     Runny nose   ??? Other    ??? Fish Allergy Nausea And Vomiting     [place] only       Review of Systems:  Review of Systems   All other systems reviewed and are negative.       BP (!) 141/75    Pulse 90    Temp 98.6 ??F (37 ??C) (Oral)    Resp 18    Ht 5\' 7"  (1.702 m)    Wt 169 lb (76.7 kg)    SpO2 95%    BMI 26.47 kg/m??       Intake/Output Summary (Last 24 hours) at 03/05/2019 1011  Last data filed at 03/05/2019 0451  Gross per 24 hour   Intake --   Output 700 ml   Net -700 ml       Physical Exam:  Constitutional:  Well developed, Well nourished, No acute distress  HENT:  Normocephalic, Atraumatic, Bilateral external ears normal,  Nose normal.   Neck- trachea is midline  Eyes:  PERRL, Conjunctiva normal  Respiratory:  Normal breath sounds, No respiratory distress, No wheezing, No chest tenderness.  Cardiovascular:  Normal heart rate, Normal rhythm, no murmurs appreciated, No rubs appreciated, No gallops appreciated, JVP not elevated  Abdomen/GI:  Soft, No tenderness  Musculoskeletal:  Intact distal pulses, no edema, No tenderness, No cyanosis, No clubbing.   Integument:  Warm, Dry  Lymphatic:  No lymphadenopathy noted.   Neurologic:  Alert & oriented  Psychiatric:  Affect and Mood :pleasant     Medications:   ??? bisacodyl  10 mg Oral Once   ??? polyethylene glycol  17 g Oral Daily   ??? apixaban  2.5 mg Oral BID   ??? pantoprazole  40 mg Intravenous BID   ??? sodium chloride flush  10 mL Intravenous 2 times per day   ??? guaiFENesin  600 mg Oral BID     ??? sodium chloride       sodium chloride, sodium chloride flush, promethazine **OR** ondansetron, acetaminophen **OR** acetaminophen, oxyCODONE-acetaminophen    Lab Data:  CBC:   Recent Labs      03/02/19  1540 03/03/19  0055 03/03/19  0055 03/03/19  2047 03/04/19  0117 03/04/19  1205 03/05/19  0735   WBC 7.6 7.5  --   --   --   --  6.6   HGB 6.5* 7.2*   < > 9.0* 8.2* 8.9* 9.0*   HCT 20.9* 22.0*   < > 27.8* 25.7*  --  27.8*   MCV 94.6 91.3  --   --   --   --  91.1   PLT 50* 45*  --   --   --   --  82*    < > = values in this interval not displayed.     BMP:   Recent Labs     03/02/19  1540 03/03/19  0055   NA 134* 133*   K 4.0 4.2   CL 100 102   CO2 24 21   BUN 28* 28*   CREATININE 1.1 0.9     PT/INR:   Recent Labs     03/02/19  1540 03/03/19  0055   PROTIME 18.2* 17.0*   INR 1.50 1.40     BNP:  No results for input(s): PROBNP in the last 72 hours.  TROPONIN: No results for input(s): TROPONINT in the last 72 hours.           Impression:  Active Problems:    Severe anemia    Anemia  Resolved Problems:    * No resolved hospital problems. *       All labs, medications and tests reviewed by myself, continue all other medications of all above medical condition listed as is except for changes mentioned above.    Thank you   Please call with questions.    Electronically signed by Helyn Numbers. Franklyn Lor, APRN - CNP on 03/05/2019 at 10:11 AM           CARDIOLOGY ATTENDING ADDENDUM    I have seen, spoken to and examined this patient personally, independently of the nurse practitioner. I have reviewed the hospital care given to date and reviewed all pertinent labs and imaging.The plan was developed mutually at the time of the visit with the patient,  NP   and myself. I have spoken with patient, nursing staff and provided written and verbal instructions .The above note has been reviewed and I agree with the assessment, diagnosis, and treatment plan with changes made by me as follows       HPI:  I have reviewed the  above HPI  And agree with above   Please review addendum/changes made to note above     Interval history:      Doing well       Physical Exam:  General:  Awake, alert, NAD  Head:normal  [id number]  Neck:  No JVD    Chest:  Clear to auscultation, respiration easy  Cardiovascular:  s1s2  Abdomen:   nontender  Extremities:  no edema  Pulses; palpable  Neuro: grossly normal      MEDICAL DECISION MAKING;    I agree with the above plan, which was planned by myself and discussed with NP.    Cont with low dose Eliquis for high CHADSVASC score   Outpt follow up     Dr. Fannie Knee, MD

## 2019-03-05 NOTE — Discharge Instructions (Addendum)
Continuity of Care Form    Patient Name: Dalton Warner   DOB:  [birth year]  MRN:  [id number]    Admit date:  03/02/2019  Discharge date:  ***    Code Status Order: Full Code   Advance Directives:   Advance Care Flowsheet Documentation       Date/Time Healthcare Directive Type of Healthcare Directive Copy in Chart Healthcare Agent Appointed Healthcare Agent's Name Healthcare Agent's Phone Number    03/02/19 2312  Yes, patient has an advance directive for healthcare treatment  Living will;Durable power of attorney for health care -- -- -- --            Admitting Physician:  Fulton Mole, MD  PCP: Leanna Sato, APRN - CNP    Discharging Nurse: [facility] Unit/Room#: 1102/1102-A  Discharging Unit Phone Number: ***    Emergency Contact:   Extended Emergency Contact Information  Primary Emergency Contact: Candelaria Celeste  Home Phone: [phone]  Mobile Phone: [phone]  Relation: Other  Secondary Emergency Contact: [employer]  Home Phone: [phone]  Relation: Child  Interpreter needed? No    Past Surgical History:  Past Surgical History:   Procedure Laterality Date    ABDOMEN SURGERY  2000    per old chart had right hemicolectomy "removed 2 and a half feet of colon and my appendix"    COLONOSCOPY  2019    ENDOSCOPY, COLON, DIAGNOSTIC      per old chart had EGD done 08/2018 and 09/2018    HERNIA REPAIR      per old chart open left ing hernia 1980 and then recurrent left ing hernia repair 08/2018    JOINT REPLACEMENT      per old chart total left knee 2018    PACEMAKER PLACEMENT      per old chart hx of st jude pacer insertion 2008- new pacer inserted 2019    SHOULDER SURGERY Left 2015    rota. cuff    SHOULDER SURGERY Right 02/27/2019    RIGHT REVERSE SHOULDER TOTAL ARTHROPLASTY REVERSE performed by Gracelyn Nurse, MD at [facility] OR       Immunization History:     There is no immunization history on file for this patient.    Active Problems:  Patient Active Problem List   Diagnosis Code     Thrombocytopenia (HCC) D69.6    History of arthroplasty of right shoulder Z96.611    Severe anemia D64.9    Anemia D64.9       Isolation/Infection:   Isolation            No Isolation          Patient Infection Status       Infection Onset Added Last Indicated Last Indicated By Review Planned Expiration Resolved Resolved By    None active    Resolved    COVID-19 Rule Out 02/20/19 02/20/19 02/20/19 Covid-19 Ambulatory (Ordered)   02/21/19 Rule-Out Test Resulted            Nurse Assessment:  Last Vital Signs: BP (!) 141/75   Pulse 90   Temp 98.6 F (37 C) (Oral)   Resp 18   Ht 5\' 7"  (1.702 m)   Wt 169 lb (76.7 kg)   SpO2 95%   BMI 26.47 kg/m     Last documented pain score (0-10 scale): Pain Level: 4  Last Weight:   Wt Readings from Last 1 Encounters:   03/05/19 169 lb (76.7  kg)     Mental Status:  {IP PT MENTAL STATUS:20030:::0}    IV Access:  {MH COC IV ACCESS:304088262:::0}    Nursing Mobility/ADLs:  Walking   {CHP DME ZHYQ:657846962:::9}  Transfer  {CHP DME ADLs:304088043:::0}  Bathing  {CHP DME ADLs:304088043:::0}  Dressing  {CHP DME ADLs:304088043:::0}  Toileting  {CHP DME ADLs:304088043:::0}  Feeding  {CHP DME ADLs:304088043:::0}  Med Admin  {CHP DME ADLs:304088043:::0}  Med Delivery   {MH COC MED Delivery:304088264:::0}    Wound Care Documentation and Therapy:        Elimination:  Continence:   Bowel: {YES / BM:84132}  Bladder: {YES / GM:01027}  Urinary Catheter: {Urinary Catheter:304088013:::0}   Colostomy/Ileostomy/Ileal Conduit: {YES / OZ:36644}       Date of Last BM: ***    Intake/Output Summary (Last 24 hours) at 03/05/2019 1001  Last data filed at 03/05/2019 0451  Gross per 24 hour   Intake --   Output 700 ml   Net -700 ml     I/O last 3 completed shifts:  In: -   Out: 700 [Urine:700]    Safety Concerns:     {MH COC Safety Concerns:304088272:::0}    Impairments/Disabilities:      {MH COC Impairments/Disabilities:304088273:::0}    Nutrition Therapy:  Current Nutrition Therapy:   {MH COC Diet  List:304088271:::0}    Routes of Feeding: {CHP DME Other Feedings:304088042:::0}  Liquids: {Slp liquid thickness:30034}  Daily Fluid Restriction: {CHP DME Yes amt example:304088041:::0}  Last Modified Barium Swallow with Video (Video Swallowing Test): {Done Not Done IHKV:425956387:::5}    Treatments at the Time of Hospital Discharge:   Respiratory Treatments: ***  Oxygen Therapy:  {Therapy; copd oxygen:17808:::0}  Ventilator:    {MH CC Vent List:304088111:::0}    Rehab Therapies: {THERAPEUTIC INTERVENTION:939 427 7820}  Weight Bearing Status/Restrictions: {MH CC Weight Bearing:304508812:::0}  Other Medical Equipment (for information only, NOT a DME order):  {EQUIPMENT:304520077}  Other Treatments: ***    Patient's personal belongings (please select all that are sent with patient):  {CHP DME Belongings:304088044:::0}    RN SIGNATURE:  {Esignature:304088025:::0}    CASE MANAGEMENT/SOCIAL WORK SECTION    Inpatient Status Date: ***    Readmission Risk Assessment Score:  Readmission Risk              Risk of Unplanned Readmission:        14           Discharging to Facility/ Agency   Name:   Address:  Phone:  Fax:    Dialysis Facility (if applicable)   Name:  Address:  Dialysis Schedule:  Phone:  Fax:    Case Manager/Social Worker signature: {Esignature:304088025:::0}    PHYSICIAN SECTION    Prognosis: Good    Condition at Discharge: Stable    Rehab Potential (if transferring to Rehab): Good    Recommended Labs or Other Treatments After Discharge: cbc weekly    Physician Certification: I certify the above information and transfer of Dalton Warner  is necessary for the continuing treatment of the diagnosis listed and that he requires Skilled Nursing Facility for less 30 days.     Update Admission H&P: No change in H&P    PHYSICIAN SIGNATURE:  Electronically signed by Ilsa Iha, MD on 03/05/19 at 10:02 AM EST

## 2019-03-13 ENCOUNTER — Ambulatory Visit: Admit: 2019-03-13 | Discharge: 2019-03-13 | Payer: MEDICARE | Attending: Internal Medicine | Primary: Family

## 2019-03-13 DIAGNOSIS — D696 Thrombocytopenia, unspecified: Secondary | ICD-10-CM

## 2019-03-13 NOTE — Patient Instructions (Signed)
Please be informed that if you contact our office outside of normal business hours the physician on call cannot help with any scheduling or rescheduling issues, procedure instruction questions or any type of medication issue.    We advise you for any urgent/emergency that you go to the nearest emergency room!    PLEASE CALL OUR OFFICE DURING NORMAL BUSINESS HOURS    Monday - Friday   8 am to 5 pm    [place]: [phone]    [place]: [phone]    [place]:  [phone]

## 2019-03-13 NOTE — Progress Notes (Signed)
Dalton Morin, MD                                  CARDIOLOGY  NOTE         Chief Complaint:    Chief Complaint   Patient presents with   ??? Follow-Up from [facility]        Tolerating oral anticoagulation well  No spontaneous bleeding noted  Denies any chest pain shortness of breath  No melena or hematochezia    HPI:     Mr. is a 86 y.o. year old male who was seen in the hospital for chronic atrial fibrillation.  Patient has history of chronic thrombocytopenia.  He has recently moved into town to take care of his wife.  Patient also has a history of LV thrombus in the past.    Chronic atrial fibrillation  CHA2DS2-VASc score is 6     EKG: Atrial fibrillation at 66 bpm      Current Outpatient Medications   Medication Sig Dispense Refill   ??? naloxegol (MOVANTIK) 25 MG TABS tablet Take 25 mg by mouth every morning     ??? oxyCODONE-acetaminophen (PERCOCET) 5-325 MG per tablet Take 1 tablet by mouth every 4 hours as needed for Pain.     ??? lubiprostone (AMITIZA) 24 MCG capsule Take 24 mcg by mouth 2 times daily (with meals)     ??? apixaban (ELIQUIS) 2.5 MG TABS tablet Take 1 tablet by mouth 2 times daily 60 tablet    ??? pantoprazole (PROTONIX) 40 MG tablet Take 1 tablet by mouth 2 times daily (before meals) 60 tablet 2   ??? guaiFENesin (MUCINEX) 600 MG extended release tablet Take 1,200 mg by mouth 2 times daily     ??? acetaminophen (TYLENOL) 500 MG tablet Take 500 mg by mouth every 6 hours as needed for Pain     ??? alfuzosin (UROXATRAL) 10 MG extended release tablet Take 10 mg by mouth daily     ??? atorvastatin (LIPITOR) 40 MG tablet Take 40 mg by mouth daily     ??? metoprolol succinate (TOPROL XL) 50 MG extended release tablet Take 50 mg by mouth nightly      ??? albuterol sulfate (PROAIR RESPICLICK) 123XX123 (90 Base) MCG/ACT aerosol powder inhalation 2 puffs every 6 hours as needed      ??? loratadine (CLARITIN) 10 MG capsule Take 10 mg by mouth daily      ??? montelukast (SINGULAIR) 10 MG tablet 10 mg nightly        No current  facility-administered medications for this visit.        Allergies:     Cat hair extract, Dust mite extract, Other, and Fish allergy    Patient History:    Past Medical History:   Diagnosis Date   ??? Arthritis    ??? Atrial fibrillation ([place])    ??? Bleeding ulcer 08/2018   ??? Cancer [facility])     Prostate cancer dx March 2010- tx with radiation    ??? Colon cancer ([place]) 2000    per old chart dx with colon ca - tx with surgery only   ??? Diabetes mellitus ([place])     "was borderline- at one time was on Metformin for several yrs-  when I went to [facility] they had me stop this in 2017"   ??? Fracture     "had broken neck in Nov 2016- wore a  collar for 6 weeks only"   ??? HOH (hard of hearing)     bil hearing aide   ??? Hx of blood clots     "had blood clot - it was a  blood clot in  my esophagus "   ??? Hx of fall     "last fall 2016- use cane walker and have electric chair"   ??? Hyperlipidemia    ??? Hypertension     follow with Dr Freeman Caldron at [facility]   ??? Limited range of motion (ROM) of shoulder     "right shoulder haved 10 % usage and left shoulder 60 % of usage"   ??? Neuropathy     both feet   ??? Sleep apnea     "had sleep study- tried to give me cpap but could not tolerate it "   ??? TIA (transient ischemic attack)     'in Jan 2009- trouble with speech, numbness of lip, weakness right arm - all only lasted for 20 seconds"   ??? Unsteady gait     "hx of back problem- was on steroids for 4 yrs- since then stability issues with my back -    ??? Wears glasses     to read      Past Surgical History:   Procedure Laterality Date   ??? ABDOMEN SURGERY  2000    per old chart had right hemicolectomy "removed 2 and a half feet of colon and my appendix"   ??? COLONOSCOPY  2019   ??? ENDOSCOPY, COLON, DIAGNOSTIC      per old chart had EGD done 08/2018 and 09/2018   ??? HERNIA REPAIR      per old chart open left ing hernia 1980 and then recurrent left ing hernia repair 08/2018   ??? JOINT REPLACEMENT      per old chart total left knee 2018   ??? PACEMAKER PLACEMENT       per old chart hx of st jude pacer insertion 2008- new pacer inserted 2019   ??? SHOULDER SURGERY Left 2015    rota. cuff   ??? SHOULDER SURGERY Right 02/27/2019    RIGHT REVERSE SHOULDER TOTAL ARTHROPLASTY REVERSE performed by Avel Sensor, MD at [facility] OR     Family History   Problem Relation Age of Onset   ??? Cancer Mother         lung cancer   ??? Diabetes Mother    ??? Diabetes Sister    ??? Cancer Brother    ??? High Blood Pressure Brother      Social History     Tobacco Use   ??? Smoking status: Never Smoker   ??? Smokeless tobacco: Never Used   Substance Use Topics   ??? Alcohol use: Yes     Comment: "maybe 6 times per year" "when had prostate cancer did drink one glass red wine daily in the past        Review of Systems:     ?? Constitutional:  No Fever or Weight Loss   ?? Eyes: No Decreased Vision  ?? ENT: No Headaches, Hearing Loss or Vertigo  ?? Cardiovascular: No Chest Pain,  No Shortness of breath, No Palpitations. No Edema   ?? Respiratory: No cough or wheezing . No Respiratory distress   ?? Gastrointestinal: No abdominal pain, appetite loss, blood in stools, constipation, diarrhea or heartburn  ?? Genitourinary: No dysuria, trouble voiding, or hematuria  ?? Musculoskeletal:  denies any new  joint aches ,  or pain   ?? Integumentary: No rash or pruritis  ?? Neurological: No TIA or stroke symptoms  ?? Psychiatric: No anxiety or depression  ?? Endocrine: No malaise, fatigue or temperature intolerance  ?? Hematologic/Lymphatic: No bleeding problems, blood clots or swollen lymph nodes  ?? Allergic/Immunologic: No nasal congestion or hives        Objective:      Physical Exam:    BP 124/64    Pulse (!) 6    Ht 5\' 7"  (1.702 m)    Wt 160 lb (72.6 kg)    BMI 25.06 kg/m??   Wt Readings from Last 3 Encounters:   03/13/19 160 lb (72.6 kg)   03/05/19 169 lb (76.7 kg)   02/27/19 165 lb (74.8 kg)     Body mass index is 25.06 kg/m??.  Vitals:    03/13/19 1431   BP: 124/64   Pulse: (!) 6        General Appearance and Constitutional: Conversant,  Well developed, Well nourished, No acute distress, Non-toxic appearance.   HEENT:  Normocephalic, Atraumatic, Bilateral external ears normal, Oropharynx moist, No oral exudates,   Nose normal.   Neck- Normal range of motion, No tenderness, Supple  Eyes:  EOMI, Conjunctiva normal, No discharge.   Respiratory:  Normal breath sounds, No respiratory distress, No wheezing, No Rales, No Ronchi.  No chest tenderness.   Cardiovascular: S1-S2 IRIR, no added heart sounds, No Mumurs appreciated. No gallops, rubs. No Pedal Edema   GI:  Bowel sounds normal, Soft, No tenderness,  GU:  No costovertebral angle tenderness   Musculoskeletal:  No gross deformities. Back- No tenderness  Integument:  Well hydrated, no rash   Lymphatic:  No lymphadenopathy noted   Neurologic:  Alert & oriented x 3, Normal motor function, normal sensory function, no focal deficits noted   Psychiatric:  Speech and behavior appropriate       Medical decision making and Data review:    DATA:    No results found for: TROPONINT  BNP:  No results found for: PROBNP  PT/INR:  No results found for: PTINR  No results found for: LABA1C  No results found for: CHOL, TRIG, HDL, LDLCALC, LDLDIRECT  Lab Results   Component Value Date    WBC 6.6 03/05/2019    HGB 9.0 (L) 03/05/2019    HCT 27.8 (L) 03/05/2019    MCV 91.1 03/05/2019    PLT 82 (L) 03/05/2019     TSH: No results found for: TSH  Lab Results   Component Value Date    AST 22 03/03/2019    ALT 11 03/03/2019    BILITOT 1.0 03/03/2019    ALKPHOS 40 03/03/2019         All labs, medications and tests reviewed by myself including data and history from outside source , patient and available family .      1. Thrombocytopenia ([place])    2. Anemia, unspecified type    3. Essential hypertension    4. Atrial fibrillation, unspecified type ([place])    5. Hyperlipidemia, unspecified hyperlipidemia type         Impression and Plan:    1. Chronic atrial fibrillation,??with history of TIA as well as LV thrombus. ??CHA2DS2-VASc score is  > 6.????Started on low-dose Eliquis understanding high risk of ischemic stroke versus bleeding risk.  2. Anemia as well as some thrombocytopenia. ??Patient evaluated by GI in hospital ??Anemia is presumed to be surgical blood loss. Last Hb 9.0  Pt has  ch. Thrombocytopenia ( 82 ) and can be observed on [place].   3. Hyperlipidemia: Cont lipitor 40 mg daily   4. HTN: Cont Metoprolol. BP well controlled.   5. SSS s.p PPM . Follow up with pacer clinic as outpt??      Return in about 6 months (around 09/10/2019).          Counseled extensively and medication compliance urged.  We discussed that for the  prevention of ASCVD our  goal is aggressive risk modification.Patient is encouraged to exercise even a brisk walk for 30 minutes  at least 3 to 4 times a week   Various goals were discussed and questions answered. Continue current medications. Appropriate prescriptions are addressed and refills ordered.  Questions answered and patient verbalizes understanding.  Call for any problems, questions, or concerns.

## 2019-03-21 NOTE — Telephone Encounter (Signed)
Left message for patient that he has been transferred in the [place] and our office is now following his device.

## 2019-03-29 ENCOUNTER — Inpatient Hospital Stay: Admit: 2019-03-29 | Discharge: 2019-03-29 | Payer: MEDICARE | Primary: Family

## 2019-03-29 ENCOUNTER — Ambulatory Visit: Admit: 2019-03-29 | Discharge: 2019-03-29 | Payer: MEDICARE | Attending: Internal Medicine | Primary: Family

## 2019-03-29 DIAGNOSIS — D696 Thrombocytopenia, unspecified: Secondary | ICD-10-CM

## 2019-03-29 DIAGNOSIS — D649 Anemia, unspecified: Secondary | ICD-10-CM

## 2019-03-29 LAB — IRON AND TIBC
Iron: 96 ug/dL (ref 59–158)
TIBC: 262 ug/dL (ref 250–450)
Transferrin %: 37 % (ref 10–44)
UIBC: 166 ug/dL (ref 110–370)

## 2019-03-29 LAB — COMPREHENSIVE METABOLIC PANEL
ALT: 10 U/L (ref 10–40)
AST: 14 IU/L — ABNORMAL LOW (ref 15–37)
Albumin: 3.9 GM/DL (ref 3.4–5.0)
Alkaline Phosphatase: 78 IU/L (ref 40–129)
Anion Gap: 8 (ref 4–16)
BUN: 16 MG/DL (ref 6–23)
CO2: 24 MMOL/L (ref 21–32)
Calcium: 8.6 MG/DL (ref 8.3–10.6)
Chloride: 104 mMol/L (ref 99–110)
Creatinine: 0.8 MG/DL — ABNORMAL LOW (ref 0.9–1.3)
GFR African American: >60 mL/min/{1.73_m2} (ref >60)
GFR Non-African American: >60 mL/min/{1.73_m2} (ref >60)
Glucose: 96 MG/DL (ref 70–99)
Potassium: 4.2 MMOL/L (ref 3.5–5.1)
Sodium: 136 MMOL/L (ref 135–145)
Total Bilirubin: 0.6 MG/DL (ref 0.0–1.0)
Total Protein: 6.2 GM/DL — ABNORMAL LOW (ref 6.4–8.2)

## 2019-03-29 LAB — VITAMIN B12 & FOLATE
Folate: 13.9 NG/ML (ref 3.1–17.5)
Vitamin B-12: 1785 pg/ml — ABNORMAL HIGH (ref 211–911)

## 2019-03-29 LAB — CBC WITH AUTO DIFFERENTIAL
Basophils %: 1.3 % — ABNORMAL HIGH (ref 0–1)
Basophils Absolute: 0.1 10*3/uL
Eosinophils Absolute: 0.1 10*3/uL
Hematocrit: 29.9 % — ABNORMAL LOW (ref 42–52)
Hemoglobin: 9.6 GM/DL — ABNORMAL LOW (ref 13.5–18.0)
Lymphocytes Absolute: 1.2 10*3/uL
MCH: 29.5 PG (ref 27–31)
MCHC: 32.1 % (ref 32.0–36.0)
MCV: 92 FL (ref 78–100)
MPV: 11 FL (ref 7.5–11.1)
Monocytes %: 12.8 % — ABNORMAL HIGH (ref 0–4)
Monocytes Absolute: 0.7 10*3/uL
Platelets: 71 10*3/uL — ABNORMAL LOW (ref 140–440)
RBC: 3.25 M/CU MM — ABNORMAL LOW (ref 4.6–6.2)
RDW: 19 % — ABNORMAL HIGH (ref 11.7–14.9)
Segs Absolute: 3.2 10*3/uL
Segs Relative: 60.9 % (ref 36–66)
WBC: 5.3 K/CU MM (ref 4.0–10.5)

## 2019-03-29 LAB — FERRITIN: Ferritin: 198 NG/ML (ref 30–400)

## 2019-03-29 LAB — LACTATE DEHYDROGENASE: LD: 195 IU/L (ref 120–246)

## 2019-03-29 MED ORDER — APIXABAN 2.5 MG PO TABS
2.5 MG | ORAL_TABLET | Freq: Two times a day (BID) | ORAL | 0 refills | Status: DC
Start: 2019-03-29 — End: 2019-08-23

## 2019-03-29 MED ORDER — APIXABAN 2.5 MG PO TABS
2.5 MG | ORAL_TABLET | Freq: Two times a day (BID) | ORAL | 3 refills | Status: DC
Start: 2019-03-29 — End: 2020-03-05

## 2019-03-29 MED ORDER — FERROUS GLUCONATE 324 (37.5 FE) MG PO TABS
324 | ORAL_TABLET | Freq: Two times a day (BID) | ORAL | 3 refills | Status: DC
Start: 2019-03-29 — End: 2019-08-23

## 2019-03-29 NOTE — Progress Notes (Signed)
Patient Name:  Dalton Warner  Patient DOB:  04/25/1930  Patient MRN:  [id number]     Primary Oncologist: Charisse Klinefelter, MD  Referring Provider: Carloyn Manner, APRN - CNP     Date of Service: 03/29/2019      Reason for Consult:  Thrombocytopenia     Chief Complaint:    Chief Complaint   Patient presents with   ??? Follow-up   ??? Results       Encounter Diagnoses   Name Primary?   ??? Anemia, unspecified type Yes   ??? Thrombocytopenia ([place])         HPI:   11/22/18: Arrived alone to the clinic today. Loves to talk.  According to Dr. Hortense Ramal his oncologist at [facility] he was diagnosed with prostate cancer in 2010.  Received external beam radiation therapy.  He has been on Xarelto since 2017 for A. fib.  Prior to which he was on Coumadin since 2008.  Further work-up was negative for hepatitis B, hepatitis C and HIV.  No evidence of hemolysis.  B12 was on lower side.  Folate was normal.  Iron studies were normal.  Serum protein electrophoresis was normal.  But imaging of fixation revealed a small IgG lambda monoclonal band.  Was advised to be on B12 supplements.  Today at the office he reported that he had hernia surgery on July 24 when he received platelet transfusion.  Reported that his been caregiver to his wife for 20 years and is currently at [facility] home at the other mass unit.  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  Married for 67 years.  Lost a lot of weight in the process of taking care of his wife.  Reported tiredness.  No bleeding or any rash.  No chest pain.  Reported arthritis pain all the time.  Reported that he is taking oral B12 supplements thousand micrograms daily.    11/02/2016: VOZ:[id number] review demonstrates a bone marrow that is approximately 30% cellular with trilineage hematopoiesis and 1-2% blasts based on the manual differential count, as confirmed by immunophenotypic studies. There are mild myeloid dyspoietic changes as well as minimal  erythroid atypia. Although reactive etiologies including medications, toxins and nutritional deficiencies must be excluded, the patient's cytopenias and bone marrow morphologic findings raise the possibility of an underlying myelodysplastic syndrome.  FISH/Cytogenetics: Normal    07/20/2018 CBC with WBC of 6.52 hemoglobin of 11.0 hematocrit 34.7 MCV of 93.3 platelets of 78 diff within normal limits    62M  platelet count was 81.    6 months ago it was 85K    09/2018: CT chest:1. Ectasia of the ascending thoracic aorta measuring 4.2 cm in short-axis diameter. ??This may be due to aortic stenosis considering the aortic valve calcifications present.  2. No evidence of aortic dissection.  3. Atherosclerotic calcification of the 3 major native coronary arteries.  4. Moderate dilation of cardiac chambers.  5. Evidence of prior granulomatous disease with no definite pneumonia or suspicious-appearing pulmonary nodules.  6. Trace right pleural effusion.  7. Mild fatty infiltration of the liver.  8. Small sliding-type hiatal hernia.    09/08/18: Ultrasound:1. Mild ascites evident in the left upper quadrant.  2. Nonvisualized pancreas and majority of the abdominal aorta due to overlying bowel gas.  3. Otherwise unremarkable study including no evidence of cirrhosis and normal splenic size measuring 9.6 cm.    12/29/18: Ct shoulder:  1. Interstitial tearing  of the subscapularis, supraspinatus, and   infraspinatus tendons with full-thickness tearing predominantly along the   conjoined fibers of the supraspinatus and infraspinatus tendons which   measures approximately 1.0 x 1.4 cm. ??There is also full-thickness   perforation of the anterior fibers of the supraspinatus tendon. ??Underlying   diffuse mild atrophy of the rotator cuff musculature.   2. Severe advanced osteoarthritis of the glenohumeral joint with underlying   diffuse complex labral tearing.   3. Severe osteoarthritis of the acromioclavicular joint.   4. Synovitis.     Feb 26 2018:Right rotator cuff surgery. Lost lot of blood and received 3 units of PRBCs.     Feb 27 2019:  Unremarkable appearance of reverse glenohumeral arthroplasty.   ??   AC joint osteoarthritis.   ??          Past Medical History:     A. fib, osteoarthritis, borderline diabetes.  Seasonal allergies, prostate cancer hypertension.                                                           Past Surgery History:      Basal cell skin cancer removal, cardiac pacemaker, colectomy for diverticular disease, hernia repair, left knee replacement, appendectomy                                                              Social History:   Lives alone.  Has 3 children, they live in [place] in [place].  Retired as Financial trader, [employer] position.  Denies any smoking history or any other illicit drug abuse.                                                                                                    Family History:    Mother was a non-smoker was diagnosed with lung cancer.  Younger brother with colon cancer.  His sister was diagnosed with uterine, ovarian cancer at the age of 21.                                                                                           Allergies   Allergen Reactions   ??? Cat Hair Extract Other (See Comments)     Runny nose   ??? Dust Mite Extract Other (See Comments)  Runny nose   ??? Other      Other reaction(s): Other (See Comments)  Dust/cats - sneezing   ??? Fish Allergy Nausea And Vomiting     [place] only  Salmon only       Current Outpatient Medications on File Prior to Visit   Medication Sig Dispense Refill   ??? apixaban (ELIQUIS) 2.5 MG TABS tablet Take 1 tablet by mouth 2 times daily 60 tablet    ??? guaiFENesin (MUCINEX) 600 MG extended release tablet Take 1,200 mg by mouth 2 times daily     ??? acetaminophen (TYLENOL) 500 MG tablet Take 500 mg by mouth every 6 hours as needed for Pain     ??? naloxegol (MOVANTIK) 25 MG TABS tablet Take 25 mg by mouth every morning     ???  oxyCODONE-acetaminophen (PERCOCET) 5-325 MG per tablet Take 1 tablet by mouth every 4 hours as needed for Pain.     ??? lubiprostone (AMITIZA) 24 MCG capsule Take 24 mcg by mouth 2 times daily (with meals)     ??? pantoprazole (PROTONIX) 40 MG tablet Take 1 tablet by mouth 2 times daily (before meals) 60 tablet 2   ??? alfuzosin (UROXATRAL) 10 MG extended release tablet Take 10 mg by mouth daily     ??? atorvastatin (LIPITOR) 40 MG tablet Take 40 mg by mouth daily     ??? metoprolol succinate (TOPROL XL) 50 MG extended release tablet Take 50 mg by mouth nightly      ??? albuterol sulfate (PROAIR RESPICLICK) 852 (90 Base) MCG/ACT aerosol powder inhalation 2 puffs every 6 hours as needed      ??? loratadine (CLARITIN) 10 MG capsule Take 10 mg by mouth daily      ??? montelukast (SINGULAIR) 10 MG tablet 10 mg nightly        No current facility-administered medications on file prior to visit.      03/29/19: Arrived alone to the clinic today. Reported that he feels much better since he had surgery for the rt shoulder. Still in therapy, ROM is improving. Since he has got new pacer, reported that he can hear a sound inside his body like a hiccup or belching intermittetly. Reported reflux sx.  Has been followed by cardiology.No bleeding or any rash. No chest pain, increased sob, palpitations, dizziness. No abdominal pain, nausea, emesis, diarrhea or any constipation.     Review of Systems:    Constitutional:  No weight loss, No fever, No chills, No night sweats.  Energy level fair  Eyes:  No diplopia, No transient or permanent loss of vision, No scotomata.  ENT / Mouth:  No epistaxis, No dysphagia, No hoarseness, No oral ulcers, No gingival bleeding.  No sore throat, No postnasal drip, No nasal drip, No mouth pain, No sinus pain, No tinnitus, Normal hearing.  Cardiovascular:  No chest pain, No palpitations, No syncope, No upper extremity edema, No lower extremity edema, No calf discomfort.  Respiratory:  No cough.  No hemoptysis, No  pleurisy, No wheezing, No dyspnea.  Gastrointestinal:  No abdominal pain, No abdominal cramping, No nausea, No vomiting, No constipation, No diarrhea, No hematochezia, No melena, No jaundice, No dyspepsia, No dysphagia.  Urinary:  No dysuria, No hematuria, No urinary incontinence.  Skin:  No rash, No nodules, No pruritus, No lesions.  Neurologic:  No confusion, No seizures, No syncope, No tremor, No speech change, No headache, No hiccups, No abnormal gait, No sensory changes, No weakness.     Vital  Signs: BP 118/72 (Site: Right Upper Arm, Position: Sitting, Cuff Size: Medium Adult)    Pulse 95    Temp 97 ??F (36.1 ??C) (Infrared)    Resp 16    Ht 5' 7"  (1.702 m)    Wt 162 lb 12.8 oz (73.8 kg)    SpO2 98%    BMI 25.50 kg/m??      Physical Exam:  CONSTITUTIONAL: awake, alert, cooperative, very talkative  EYES:PEAR, no palor or any icetrus  WUJ:WJXB  NECK: No JVD  HEMATOLOGIC/LYMPHATIC: no cervical, supraclavicular or axillary lymphadenopathy   LUNGS: CTAB  CARDIOVASCULAR: s1s2 irr irr ESM  ABDOMEN: soft ntnd bs pos  NEUROLOGIC: GI  SKIN: No rash  EXTREMITIES: no LE edema bilaterally, Dec ROM RUE     Labs:  Hematology:  Lab Results   Component Value Date    WBC 5.3 03/29/2019    RBC 3.25 (L) 03/29/2019    HGB 9.6 (L) 03/29/2019    HCT 29.9 (L) 03/29/2019    MCV 92.0 03/29/2019    MCH 29.5 03/29/2019    MCHC 32.1 03/29/2019    RDW 19.0 (H) 03/29/2019    PLT 71 (L) 03/29/2019    MPV 11.0 03/29/2019    BANDSPCT 4 (L) 02/28/2019    SEGSPCT 60.9 03/29/2019    EOSRELPCT 2.6 03/29/2019    BASOPCT 1.3 (H) 03/29/2019    LYMPHOPCT 22.4 (L) 03/29/2019    MONOPCT 12.8 (H) 03/29/2019    BANDABS 0.50 02/28/2019    SEGSABS 3.2 03/29/2019    EOSABS 0.1 03/29/2019    BASOSABS 0.1 03/29/2019    LYMPHSABS 1.2 03/29/2019    MONOSABS 0.7 03/29/2019    DIFFTYPE AUTOMATED DIFFERENTIAL 03/29/2019    WBCMORP OCCASIONAL 02/28/2019     Lab Results   Component Value Date    ESR 5 02/20/2019     Chemistry:  Lab Results   Component Value Date    NA  133 (L) 03/03/2019    K 4.2 03/03/2019    CL 102 03/03/2019    CO2 21 03/03/2019    BUN 28 (H) 03/03/2019    CREATININE 0.9 03/03/2019    GLUCOSE 120 (H) 03/03/2019    CALCIUM 7.6 (L) 03/03/2019    PROT 4.4 (L) 03/03/2019    LABALBU 3.0 (L) 03/03/2019    BILITOT 1.0 03/03/2019    ALKPHOS 40 03/03/2019    AST 22 03/03/2019    ALT 11 03/03/2019    LABGLOM >60 03/03/2019    GFRAA >60 03/03/2019    MG 1.9 03/02/2019    POCGLU 99 02/27/2019     No results found for: MMA, LDH, HOMOCYSTEINE  No components found for: LD  No results found for: TSHHS, T4FREE, FT3  Immunology:  Lab Results   Component Value Date    PROT 4.4 (L) 03/03/2019     No results found for: KAPPAUVOL, LAMBDAUVOL, KLFLCR  No results found for: B2M  Coagulation Panel:  Lab Results   Component Value Date    PROTIME 17.0 (H) 03/03/2019    INR 1.40 03/03/2019    APTT 46.4 (H) 03/03/2019     Anemia Panel:  No results found for: VITAMINB12, FOLATE  Tumor Markers:  No results found for: CA125, CEA, CA199, LABCA2, PSA     Observations:  ECOG:  PHQ-9 Total Score: 0 (03/29/2019  9:39 AM)       Assessment & Plan:  Chronic thrombocytopenia: Etiology could be secondary to underlying MDS.  Cytogenetics were normal, blast being 2% his IPSS score was about 1.5 which is a low score.  Platelet 71K. Continue to monitor for now.     Normocytic anemia.  Hb decline most probably sec to blood loss, ferritin pending. Recommend oral iron and also  b12 supplements. Adequate bowel regimen. Hb 9.6 today    Monoclonal gammopathy noted to have small IgG lambda band on the immunofixation but normal serum protein like to pheresis.  Normal renal function normal calcium levels.  Serum immunoglobulins and serum free light chains were normal.  No evidence of any myeloma defining disease.  We will continue to monitor.    Paroxysmal atrial fibrillation.  Has pacemaker.  Is on Eliquis 2.5 mg po bid.   Continue as long as a platelet count  of more than 60 K.  Avoid falls, deep cuts and monitor closely for any bleeding.     H/o Colon cancer: s/p resection in 2000, did not receive any adjuvant treatment.    H/O Prostate cancer: Diagnosed in 2010. S/P XRT. Is being followed by Urology.     Family history of colon cancer.  Reported that he is up-to-date with the colonoscopy.    Family history ovarian cancer.  No genetic testing has been done.    Continue other medical care.    Discussed above findings and plan with him and he voiced understanding.  Answered all his questions.    Discussed healthy lifestyle including healthy diet, regular exercise as tolerated.  Also discussed importance of being up-to-date with age-appropriate screening tools.    Recommend follow-up with primary care physician and other specialists.    Please do not hesitate to contact us if you need further information.    Return to clinic May 21 2019  or earlier if new symptoms.    [place]

## 2019-03-29 NOTE — Telephone Encounter (Signed)
90 with refills  Ov 8/21  Pt needs asap Dr Izetta Dakin was to call in 2 weeks ago.    Please give pt samples will pick up Friday after 2 pm.  He is out

## 2019-03-29 NOTE — Progress Notes (Signed)
MA Rooming Questions  Patient: Dalton Warner  MRN: [id number]    Date: 03/29/2019        1. Do you have any new issues?   Says new xarelto is a problem,     2. Do you need any refills on medications?    no    3. Have you had any imaging done since your last visit?  Yes  @ hospital      4. Have you been hospitalized or seen in the emergency room since your last visit here?   yes - [place]    5. Did the patient have a depression screening completed today? Yes    PHQ-9 Total Score: 0 (03/29/2019  9:39 AM)       PHQ-9 Given to (if applicable):               PHQ-9 Score (if applicable):                     []  Positive     []   Negative              Does question #9 need addressed (if applicable)                     []  Yes    []   No               Glenford Peers, MA

## 2019-03-29 NOTE — Telephone Encounter (Signed)
Samples on separate encounter

## 2019-03-30 LAB — ELECTROPHORESIS PROTEIN, SERUM
Albumin Electrophoresis: 3.5 GM/DL (ref 3.2–5.6)
Alpha-1-Globulin: 0.3 GM/DL (ref 0.1–0.4)
Alpha-2-Globulin: 0.6 GM/DL (ref 0.4–1.2)
Beta Globulin: 0.7 GM/DL (ref 0.5–1.3)
Gamma Globulin: 0.9 GM/DL (ref 0.5–1.6)
SPEP Interpretation: DECREASED

## 2019-05-24 ENCOUNTER — Ambulatory Visit: Admit: 2019-05-24 | Discharge: 2019-05-24 | Payer: MEDICARE | Attending: Internal Medicine | Primary: Family

## 2019-05-24 ENCOUNTER — Inpatient Hospital Stay: Admit: 2019-05-24 | Discharge: 2019-05-24 | Payer: MEDICARE | Primary: Family

## 2019-05-24 DIAGNOSIS — D649 Anemia, unspecified: Secondary | ICD-10-CM

## 2019-05-24 DIAGNOSIS — D696 Thrombocytopenia, unspecified: Secondary | ICD-10-CM

## 2019-05-24 LAB — CBC WITH AUTO DIFFERENTIAL
Basophils %: 0.7 % (ref 0–1)
Basophils Absolute: 0.1 10*3/uL
Eosinophils %: 2.3 % (ref 0–3)
Eosinophils Absolute: 0.2 10*3/uL
Hematocrit: 32.3 % — ABNORMAL LOW (ref 42–52)
Hemoglobin: 10.5 GM/DL — ABNORMAL LOW (ref 13.5–18.0)
Lymphocytes %: 23.2 % — ABNORMAL LOW (ref 24–44)
Lymphocytes Absolute: 1.6 10*3/uL
MCH: 29 PG (ref 27–31)
MCHC: 32.5 % (ref 32.0–36.0)
MCV: 89.2 FL (ref 78–100)
MPV: 10.7 FL (ref 7.5–11.1)
Monocytes %: 14.2 % — ABNORMAL HIGH (ref 0–4)
Monocytes Absolute: 1 10*3/uL
Platelets: 84 10*3/uL — ABNORMAL LOW (ref 140–440)
RBC: 3.62 10*6/uL — ABNORMAL LOW (ref 4.6–6.2)
RDW: 21.2 % — ABNORMAL HIGH (ref 11.7–14.9)
Segs Absolute: 4.1 10*3/uL
Segs Relative: 59.6 % (ref 36–66)
WBC: 6.8 10*3/uL (ref 4.0–10.5)

## 2019-05-24 LAB — COMPREHENSIVE METABOLIC PANEL
ALT: 10 U/L (ref 10–40)
AST: 14 IU/L — ABNORMAL LOW (ref 15–37)
Albumin: 3.9 GM/DL (ref 3.4–5.0)
Alkaline Phosphatase: 78 IU/L (ref 40–129)
Anion Gap: 8 (ref 4–16)
BUN: 19 MG/DL (ref 6–23)
CO2: 25 MMOL/L (ref 21–32)
Calcium: 8.5 MG/DL (ref 8.3–10.6)
Chloride: 104 mMol/L (ref 99–110)
Creatinine: 1 MG/DL (ref 0.9–1.3)
GFR African American: >60 mL/min/{1.73_m2} (ref >60)
GFR Non-African American: >60 mL/min/{1.73_m2} (ref >60)
Glucose: 103 MG/DL — ABNORMAL HIGH (ref 70–99)
Potassium: 4.8 MMOL/L (ref 3.5–5.1)
Sodium: 137 MMOL/L (ref 135–145)
Total Bilirubin: 0.7 MG/DL (ref 0.0–1.0)
Total Protein: 5.9 GM/DL — ABNORMAL LOW (ref 6.4–8.2)

## 2019-05-24 LAB — VITAMIN B12 & FOLATE
Folate: 9.2 NG/ML (ref 3.1–17.5)
Vitamin B-12: >2000 pg/ml — ABNORMAL HIGH (ref 211–911)

## 2019-05-24 LAB — IRON AND TIBC
Iron: 102 ug/dL (ref 59–158)
TIBC: 275 ug/dL (ref 250–450)
Transferrin %: 37 % (ref 10–44)
UIBC: 173 ug/dL (ref 110–370)

## 2019-05-24 LAB — LACTATE DEHYDROGENASE: LD: 170 IU/L (ref 120–246)

## 2019-05-24 LAB — RETICULOCYTES: Retic Ct Pct: 2.2 % (ref 0.2–2.20)

## 2019-05-24 LAB — TSH: TSH, High Sensitivity: 1.76 u[IU]/mL (ref 0.270–4.20)

## 2019-05-24 NOTE — Progress Notes (Signed)
Patient Name:  Dalton Warner  Patient DOB:  [birth year]  Patient MRN:  [id number]     Primary Oncologist: Charisse Klinefelter, MD  Referring Provider: Carloyn Manner, APRN - CNP     Date of Service: 05/24/2019      Reason for Consult:  Thrombocytopenia     Chief Complaint:    Chief Complaint   Patient presents with   ??? Discuss Labs       Encounter Diagnoses   Name Primary?   ??? Anemia, unspecified type Yes   ??? Monoclonal gammopathy         HPI:   11/22/18: Arrived alone to the clinic today. Loves to talk.  According to Dr. Hortense Ramal his oncologist at [facility] he was diagnosed with prostate cancer in 2010.  Received external beam radiation therapy.  He has been on Xarelto since 2017 for A. fib.  Prior to which he was on Coumadin since 2008.  Further work-up was negative for hepatitis B, hepatitis C and HIV.  No evidence of hemolysis.  B12 was on lower side.  Folate was normal.  Iron studies were normal.  Serum protein electrophoresis was normal.  But imaging of fixation revealed a small IgG lambda monoclonal band.  Was advised to be on B12 supplements.  Today at the office he reported that he had hernia surgery on July 24 when he received platelet transfusion.  Reported that his been caregiver to his wife for 20 years and is currently at [facility] home at the other mass unit.  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  Married for 67 years.  Lost a lot of weight in the process of taking care of his wife.  Reported tiredness.  No bleeding or any rash.  No chest pain.  Reported arthritis pain all the time.  Reported that he is taking oral B12 supplements thousand micrograms daily.    11/02/2016: VOZ:[id number] review demonstrates a bone marrow that is approximately 30% cellular with trilineage hematopoiesis and 1-2% blasts based on the manual differential count, as confirmed by immunophenotypic studies. There are mild myeloid dyspoietic changes as well as minimal erythroid  atypia. Although reactive etiologies including medications, toxins and nutritional deficiencies must be excluded, the patient's cytopenias and bone marrow morphologic findings raise the possibility of an underlying myelodysplastic syndrome.  FISH/Cytogenetics: Normal    07/20/2018 CBC with WBC of 6.52 hemoglobin of 11.0 hematocrit 34.7 MCV of 93.3 platelets of 78 diff within normal limits    36M  platelet count was 81.    6 months ago it was 85K    09/2018: CT chest:1. Ectasia of the ascending thoracic aorta measuring 4.2 cm in short-axis diameter. ??This may be due to aortic stenosis considering the aortic valve calcifications present.  2. No evidence of aortic dissection.  3. Atherosclerotic calcification of the 3 major native coronary arteries.  4. Moderate dilation of cardiac chambers.  5. Evidence of prior granulomatous disease with no definite pneumonia or suspicious-appearing pulmonary nodules.  6. Trace right pleural effusion.  7. Mild fatty infiltration of the liver.  8. Small sliding-type hiatal hernia.    09/08/18: Ultrasound:1. Mild ascites evident in the left upper quadrant.  2. Nonvisualized pancreas and majority of the abdominal aorta due to overlying bowel gas.  3. Otherwise unremarkable study including no evidence of cirrhosis and normal splenic size measuring 9.6 cm.    12/29/18: Ct shoulder:  1. Interstitial tearing of the subscapularis,  supraspinatus, and   infraspinatus tendons with full-thickness tearing predominantly along the   conjoined fibers of the supraspinatus and infraspinatus tendons which   measures approximately 1.0 x 1.4 cm. ??There is also full-thickness   perforation of the anterior fibers of the supraspinatus tendon. ??Underlying   diffuse mild atrophy of the rotator cuff musculature.   2. Severe advanced osteoarthritis of the glenohumeral joint with underlying   diffuse complex labral tearing.   3. Severe osteoarthritis of the acromioclavicular joint.   4. Synovitis.     Feb 26 2018:Right rotator cuff surgery. Lost lot of blood and received 3 units of PRBCs.     Feb 27 2019:  Unremarkable appearance of reverse glenohumeral arthroplasty.   ??   AC joint osteoarthritis.   ??          Past Medical History:     A. fib, osteoarthritis, borderline diabetes.  Seasonal allergies, prostate cancer hypertension.                                                           Past Surgery History:      Basal cell skin cancer removal, cardiac pacemaker, colectomy for diverticular disease, hernia repair, left knee replacement, appendectomy                                                              Social History:   Lives alone.  Has 3 children, they live in [place] in [place].  Retired as Financial trader, [employer] position.  Denies any smoking history or any other illicit drug abuse.                                                                                                    Family History:    Mother was a non-smoker was diagnosed with lung cancer.  Younger brother with colon cancer.  His sister was diagnosed with uterine, ovarian cancer at the age of 58.                                                                                           Allergies   Allergen Reactions   ??? Cat Hair Extract Other (See Comments)     Runny nose   ??? Dust Mite Extract Other (See Comments)     Runny nose   ???  Other      Other reaction(s): Other (See Comments)  Dust/cats - sneezing   ??? Fish Allergy Nausea And Vomiting     Salmon only  Salmon only       Current Outpatient Medications on File Prior to Visit   Medication Sig Dispense Refill   ??? apixaban (ELIQUIS) 2.5 MG TABS tablet Take 1 tablet by mouth 2 times daily 180 tablet 3   ??? ferrous gluconate 324 (37.5 Fe) MG TABS Take 1 tablet by mouth 2 times daily 60 tablet 3   ??? apixaban (ELIQUIS) 2.5 MG TABS tablet Take 1 tablet by mouth 2 times daily 28 tablet 0   ??? naloxegol (MOVANTIK) 25 MG TABS tablet Take 25 mg by mouth every morning     ??? oxyCODONE-acetaminophen  (PERCOCET) 5-325 MG per tablet Take 1 tablet by mouth every 4 hours as needed for Pain.     ??? lubiprostone (AMITIZA) 24 MCG capsule Take 24 mcg by mouth 2 times daily (with meals)     ??? pantoprazole (PROTONIX) 40 MG tablet Take 1 tablet by mouth 2 times daily (before meals) 60 tablet 2   ??? guaiFENesin (MUCINEX) 600 MG extended release tablet Take 1,200 mg by mouth 2 times daily     ??? acetaminophen (TYLENOL) 500 MG tablet Take 500 mg by mouth every 6 hours as needed for Pain     ??? alfuzosin (UROXATRAL) 10 MG extended release tablet Take 10 mg by mouth daily     ??? atorvastatin (LIPITOR) 40 MG tablet Take 40 mg by mouth daily     ??? metoprolol succinate (TOPROL XL) 50 MG extended release tablet Take 50 mg by mouth nightly      ??? albuterol sulfate (PROAIR RESPICLICK) 161 (90 Base) MCG/ACT aerosol powder inhalation 2 puffs every 6 hours as needed      ??? loratadine (CLARITIN) 10 MG capsule Take 10 mg by mouth daily      ??? montelukast (SINGULAIR) 10 MG tablet 10 mg nightly        No current facility-administered medications on file prior to visit.      Interval history:05/24/19   Arrived alone to the clinic today. Reported that he feels much better since he had surgery for the rt shoulder. ROM improved a lot.  No bleeding or any rash. No chest pain, increased sob, palpitations, dizziness. No abdominal pain, nausea, emesis, diarrhea or any constipation. Started exercising three times a week in their wellness centre.     Review of Systems:    Constitutional:  No weight loss, No fever, No chills, No night sweats.  Energy level fair  Eyes:  No diplopia, No transient or permanent loss of vision, No scotomata.  ENT / Mouth:  No epistaxis, No dysphagia, No hoarseness, No oral ulcers, No gingival bleeding.  No sore throat, No postnasal drip, No nasal drip, No mouth pain, No sinus pain, No tinnitus, Normal hearing.  Cardiovascular:  No chest pain, No palpitations, No syncope, No upper extremity edema, No lower extremity edema, No  calf discomfort.  Respiratory:  No cough.  No hemoptysis, No pleurisy, No wheezing, No dyspnea.  Gastrointestinal:  No abdominal pain, No abdominal cramping, No nausea, No vomiting, No constipation, No diarrhea, No hematochezia, No melena, No jaundice, No dyspepsia, No dysphagia.  Urinary:  No dysuria, No hematuria, No urinary incontinence.  Skin:  No rash, No nodules, No pruritus, No lesions.  Neurologic:  No confusion, No seizures, No syncope, No tremor, No speech  change, No headache, No hiccups, No abnormal gait, No sensory changes, No weakness.     Vital Signs: BP (!) 126/55 (Site: Left Upper Arm, Position: Sitting, Cuff Size: Large Adult)    Pulse 95    Temp 96.6 ??F (35.9 ??C) (Infrared)    Resp 16    Ht 5' 7"  (1.702 m)    Wt 166 lb (75.3 kg)    SpO2 97%    BMI 26.00 kg/m??      Physical Exam:  CONSTITUTIONAL: awake, alert, cooperative, very talkative  EYES:PEAR, no palor or any icetrus  ZOX:WRUE  NECK: No JVD  HEMATOLOGIC/LYMPHATIC: no cervical, supraclavicular or axillary lymphadenopathy   LUNGS: CTAB  CARDIOVASCULAR: s1s2 irr irr ESM  ABDOMEN: soft ntnd bs pos  NEUROLOGIC: GI  SKIN: No rash  EXTREMITIES: no LE edema bilaterally.      Labs:  Hematology:  Lab Results   Component Value Date    WBC 5.3 03/29/2019    RBC 3.25 (L) 03/29/2019    HGB 9.6 (L) 03/29/2019    HCT 29.9 (L) 03/29/2019    MCV 92.0 03/29/2019    MCH 29.5 03/29/2019    MCHC 32.1 03/29/2019    RDW 19.0 (H) 03/29/2019    PLT 71 (L) 03/29/2019    MPV 11.0 03/29/2019    BANDSPCT 4 (L) 02/28/2019    SEGSPCT 60.9 03/29/2019    EOSRELPCT 2.6 03/29/2019    BASOPCT 1.3 (H) 03/29/2019    LYMPHOPCT 22.4 (L) 03/29/2019    MONOPCT 12.8 (H) 03/29/2019    BANDABS 0.50 02/28/2019    SEGSABS 3.2 03/29/2019    EOSABS 0.1 03/29/2019    BASOSABS 0.1 03/29/2019    LYMPHSABS 1.2 03/29/2019    MONOSABS 0.7 03/29/2019    DIFFTYPE AUTOMATED DIFFERENTIAL 03/29/2019    WBCMORP OCCASIONAL 02/28/2019     Lab Results   Component Value Date    ESR 5 02/20/2019      Chemistry:  Lab Results   Component Value Date    NA 136 03/29/2019    K 4.2 03/29/2019    CL 104 03/29/2019    CO2 24 03/29/2019    BUN 16 03/29/2019    CREATININE 0.8 (L) 03/29/2019    GLUCOSE 96 03/29/2019    CALCIUM 8.6 03/29/2019    PROT 6.2 (L) 03/29/2019    PROT 6.0 (L) 03/29/2019    LABALBU 3.9 03/29/2019    BILITOT 0.6 03/29/2019    ALKPHOS 78 03/29/2019    AST 14 (L) 03/29/2019    ALT 10 03/29/2019    LABGLOM >60 03/29/2019    GFRAA >60 03/29/2019    MG 1.9 03/02/2019    POCGLU 99 02/27/2019     Lab Results   Component Value Date    LDH 195 03/29/2019     No components found for: LD  No results found for: TSHHS, T4FREE, FT3  Immunology:  Lab Results   Component Value Date    PROT 6.2 (L) 03/29/2019    PROT 6.0 (L) 03/29/2019    SPEP  03/29/2019     INTERPRETATION - Slightly decreased total proteins.  LP.    ALBUMINELP 3.5 03/29/2019    LABALPH 0.3 03/29/2019    LABALPH 0.6 03/29/2019    LABBETA 0.7 03/29/2019    GAMGLOB 0.9 03/29/2019     No results found for: KAPPAUVOL, LAMBDAUVOL, KLFLCR  No results found for: B2M  Coagulation Panel:  Lab Results   Component Value Date    PROTIME 17.0 (  H) 03/03/2019    INR 1.40 03/03/2019    APTT 46.4 (H) 03/03/2019     Anemia Panel:  Lab Results   Component Value Date    VITAMINB12 1017 (H) 03/29/2019    FOLATE 13.9 03/29/2019     Tumor Markers:  No results found for: CA125, CEA, CA199, LABCA2, PSA     Observations:  ECOG:  PHQ-9 Total Score: 0 (05/24/2019  9:57 AM)       Assessment & Plan:                                                          Chronic thrombocytopenia: Etiology could be secondary to underlying MDS.  Cytogenetics in the past were normal, blast being 2% his IPSS score was about 1.5 which is a low score.  Platelet 71K. Continue to monitor for now.     Normocytic anemia: Could be sec to MDS. Other w/u pending. Consider further evaluation with repeat BMB/aspirate if Hb declining. Consider ESA.      Monoclonal gammopathy noted to have small IgG lambda  band on the immunofixation but normal serum protein electrophoresis.  Normal renal function normal calcium levels.  Serum immunoglobulins and serum free light chains were normal.  No evidence of any myeloma defining disease.  Will repeat in a year in 2022 feb    Paroxysmal atrial fibrillation.  Has pacemaker.  Is on Eliquis 2.5 mg po bid.   Continue as long as a platelet count of more than 60 K.  Avoid falls, deep cuts and monitor closely for any bleeding.     H/o Colon cancer: s/p resection in 2000, did not receive any adjuvant treatment.    H/O Prostate cancer: Diagnosed in 2010. S/P XRT. Is being followed by Urology.     Family history of colon cancer.  Reported that he is up-to-date with the colonoscopy.    Family history ovarian cancer.  No genetic testing has been done.    Continue other medical care.    Discussed above findings and plan with him and he voiced understanding.  Answered all his questions.    Discussed healthy lifestyle including healthy diet, regular exercise as tolerated.  Also discussed importance of being up-to-date with age-appropriate screening tools.    Recommend follow-up with primary care physician and other specialists.    Please do not hesitate to contact us if you need further information.    Return to clinic July 2021  or earlier if new symptoms.    [place]

## 2019-05-24 NOTE — Progress Notes (Signed)
MA Rooming Questions  Patient: Dalton Warner  MRN: [id number]    Date: 05/24/2019        1. Do you have any new issues?   no         2. Do you need any refills on medications?    no    3. Have you had any imaging done since your last visit?   no    4. Have you been hospitalized or seen in the emergency room since your last visit here?   no    5. Did the patient have a depression screening completed today? Yes    PHQ-9 Total Score: 0 (05/24/2019  9:57 AM)       PHQ-9 Given to (if applicable):               PHQ-9 Score (if applicable):                     []  Positive     []   Negative              Does question #9 need addressed (if applicable)                     []  Yes    []   No               Waynard Reeds, MA

## 2019-05-26 LAB — ANA: ANA: NOT DETECTED

## 2019-05-26 LAB — HAPTOGLOBIN: Haptoglobin: 120 mg/dL (ref 30–200)

## 2019-06-11 ENCOUNTER — Ambulatory Visit: Payer: MEDICARE | Attending: Cardiovascular Disease | Primary: Family

## 2019-06-11 DIAGNOSIS — Z950 Presence of cardiac pacemaker: Secondary | ICD-10-CM

## 2019-08-16 ENCOUNTER — Encounter: Primary: Family

## 2019-08-16 ENCOUNTER — Inpatient Hospital Stay: Admit: 2019-08-16 | Discharge: 2019-08-16 | Payer: MEDICARE | Primary: Family

## 2019-08-16 DIAGNOSIS — D649 Anemia, unspecified: Secondary | ICD-10-CM

## 2019-08-16 LAB — CBC WITH AUTO DIFFERENTIAL
Basophils %: 0.6 % (ref 0–1)
Basophils Absolute: 0.1 10*3/uL
Eosinophils %: 0.6 % (ref 0–3)
Eosinophils Absolute: 0.1 10*3/uL
Hematocrit: 31.4 % — ABNORMAL LOW (ref 42–52)
Hemoglobin: 10.4 GM/DL — ABNORMAL LOW (ref 13.5–18.0)
Lymphocytes %: 20.3 % — ABNORMAL LOW (ref 24–44)
Lymphocytes Absolute: 1.6 10*3/uL
MCH: 29 PG (ref 27–31)
MCHC: 33.1 % (ref 32.0–36.0)
MCV: 87.5 FL (ref 78–100)
MPV: 11 FL (ref 7.5–11.1)
Monocytes %: 13.4 % — ABNORMAL HIGH (ref 0–4)
Monocytes Absolute: 1.1 10*3/uL
Platelets: 74 10*3/uL — ABNORMAL LOW (ref 140–440)
RBC: 3.59 10*6/uL — ABNORMAL LOW (ref 4.6–6.2)
RDW: 21.4 % — ABNORMAL HIGH (ref 11.7–14.9)
Segs Absolute: 5.2 10*3/uL
Segs Relative: 65.1 % (ref 36–66)
WBC: 7.9 10*3/uL (ref 4.0–10.5)

## 2019-08-16 LAB — VITAMIN B12 & FOLATE
Folate: 9.9 NG/ML (ref 3.1–17.5)
Vitamin B-12: 675.9 pg/ml (ref 211–911)

## 2019-08-16 LAB — IRON AND TIBC
Iron: 116 ug/dL (ref 59–158)
TIBC: 290 ug/dL (ref 250–450)
Transferrin %: 40 % (ref 10–44)
UIBC: 174 ug/dL (ref 110–370)

## 2019-08-16 LAB — COMPREHENSIVE METABOLIC PANEL
ALT: 9 U/L — ABNORMAL LOW (ref 10–40)
AST: 15 IU/L (ref 15–37)
Albumin: 4.7 GM/DL (ref 3.4–5.0)
Alkaline Phosphatase: 71 IU/L (ref 40–129)
Anion Gap: 14 (ref 4–16)
BUN: 18 MG/DL (ref 6–23)
CO2: 23 MMOL/L (ref 21–32)
Calcium: 8.6 MG/DL (ref 8.3–10.6)
Chloride: 102 mMol/L (ref 99–110)
Creatinine: 1 MG/DL (ref 0.9–1.3)
GFR African American: >60 mL/min/{1.73_m2} (ref >60)
GFR Non-African American: >60 mL/min/{1.73_m2} (ref >60)
Glucose: 93 MG/DL (ref 70–99)
Potassium: 4.8 MMOL/L (ref 3.5–5.1)
Sodium: 139 MMOL/L (ref 135–145)
Total Bilirubin: 0.9 MG/DL (ref 0.0–1.0)
Total Protein: 6.2 GM/DL — ABNORMAL LOW (ref 6.4–8.2)

## 2019-08-16 LAB — FERRITIN: Ferritin: 84 NG/ML (ref 30–400)

## 2019-08-23 ENCOUNTER — Inpatient Hospital Stay: Admit: 2019-08-23 | Discharge: 2019-08-23 | Payer: MEDICARE | Primary: Family

## 2019-08-23 ENCOUNTER — Ambulatory Visit: Admit: 2019-08-23 | Discharge: 2019-08-23 | Payer: MEDICARE | Attending: Internal Medicine | Primary: Family

## 2019-08-23 DIAGNOSIS — D696 Thrombocytopenia, unspecified: Secondary | ICD-10-CM

## 2019-08-23 NOTE — Progress Notes (Addendum)
Patient Name:  Dalton Warner  Patient DOB:  [birth year]  Patient MRN:  [id number]     Primary Oncologist: Charisse Klinefelter, MD  Referring Provider: Carloyn Manner, APRN - CNP     Date of Service: 08/23/2019      Reason for Consult:  Thrombocytopenia     Chief Complaint:    Chief Complaint   Patient presents with   ??? Follow-up   ??? Discuss Labs       Encounter Diagnoses   Name Primary?   ??? Thrombocytopenia (HCC) Yes   ??? Monoclonal gammopathy    ??? Anemia, unspecified type         HPI:   11/22/18: Arrived alone to the clinic today. Loves to talk.  According to Dr. Hortense Ramal his oncologist at [facility] he was diagnosed with prostate cancer in 2010.  Received external beam radiation therapy.  He has been on Xarelto since 2017 for A. fib.  Prior to which he was on Coumadin since 2008.  Further work-up was negative for hepatitis B, hepatitis C and HIV.  No evidence of hemolysis.  B12 was on lower side.  Folate was normal.  Iron studies were normal.  Serum protein electrophoresis was normal.  But imaging of fixation revealed a small IgG lambda monoclonal band.  Was advised to be on B12 supplements.  Today at the office he reported that he had hernia surgery on July 24 when he received platelet transfusion.  Reported that his been caregiver to his wife for 20 years and is currently at [facility] home at the other mass unit.  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  Married for 67 years.  Lost a lot of weight in the process of taking care of his wife.  Reported tiredness.  No bleeding or any rash.  No chest pain.  Reported arthritis pain all the time.  Reported that he is taking oral B12 supplements thousand micrograms daily.    11/02/2016: QVZ:DGLOVFIEPPI review demonstrates a bone marrow that is approximately 30% cellular with trilineage hematopoiesis and 1-2% blasts based on the manual differential count, as confirmed by immunophenotypic studies. There are mild myeloid dyspoietic  changes as well as minimal erythroid atypia. Although reactive etiologies including medications, toxins and nutritional deficiencies must be excluded, the patient's cytopenias and bone marrow morphologic findings raise the possibility of an underlying myelodysplastic syndrome.  FISH/Cytogenetics: Normal    07/20/2018 CBC with WBC of 6.52 hemoglobin of 11.0 hematocrit 34.7 MCV of 93.3 platelets of 78 diff within normal limits    March 2020  platelet count was 81.    Jan 2020  it was 85K    09/2018: CT chest:1. Ectasia of the ascending thoracic aorta measuring 4.2 cm in short-axis diameter. ??This may be due to aortic stenosis considering the aortic valve calcifications present.  2. No evidence of aortic dissection.  3. Atherosclerotic calcification of the 3 major native coronary arteries.  4. Moderate dilation of cardiac chambers.  5. Evidence of prior granulomatous disease with no definite pneumonia or suspicious-appearing pulmonary nodules.  6. Trace right pleural effusion.  7. Mild fatty infiltration of the liver.  8. Small sliding-type hiatal hernia.    09/08/18: Ultrasound:1. Mild ascites evident in the left upper quadrant.  2. Nonvisualized pancreas and majority of the abdominal aorta due to overlying bowel gas.  3. Otherwise unremarkable study including no evidence of cirrhosis and normal splenic size measuring 9.6 cm.  12/29/18: Ct shoulder:  1. Interstitial tearing of the subscapularis, supraspinatus, and   infraspinatus tendons with full-thickness tearing predominantly along the   conjoined fibers of the supraspinatus and infraspinatus tendons which   measures approximately 1.0 x 1.4 cm. ??There is also full-thickness   perforation of the anterior fibers of the supraspinatus tendon. ??Underlying   diffuse mild atrophy of the rotator cuff musculature.   2. Severe advanced osteoarthritis of the glenohumeral joint with underlying   diffuse complex labral tearing.   3. Severe osteoarthritis of the acromioclavicular  joint.   4. Synovitis.     Feb 26 2018:Right rotator cuff surgery. Lost lot of blood and received 3 units of PRBCs.     Feb 27 2019:  Unremarkable appearance of reverse glenohumeral arthroplasty.   ??   AC joint osteoarthritis.   ??          Past Medical History:     A. fib, osteoarthritis, borderline diabetes.  Seasonal allergies, prostate cancer hypertension.                                                           Past Surgery History:      Basal cell skin cancer removal, cardiac pacemaker, colectomy for diverticular disease, hernia repair, left knee replacement, appendectomy                                                              Social History:   Lives alone.  Has 3 children, they live in [place] in [place].  Retired as Financial trader, [employer] position.  Denies any smoking history or any other illicit drug abuse.                                                                                                    Family History:    Mother was a non-smoker was diagnosed with lung cancer.  Younger brother with colon cancer.  His sister was diagnosed with uterine, ovarian cancer at the age of 79.                                                                                           Allergies   Allergen Reactions   ??? Cat Hair Extract Other (See Comments)     Runny nose   ??? Dust Mite Extract  Other (See Comments)     Runny nose   ??? Other      Other reaction(s): Other (See Comments)  Dust/cats - sneezing   ??? Fish Allergy Nausea And Vomiting     Sal[place] only  Salmon only       Current Outpatient Medications on File Prior to Visit   Medication Sig Dispense Refill   ??? cetirizine (ZYRTEC) 10 MG tablet Take 10 mg by mouth daily     ??? apixaban (ELIQUIS) 2.5 MG TABS tablet Take 1 tablet by mouth 2 times daily 180 tablet 3   ??? pantoprazole (PROTONIX) 40 MG tablet Take 1 tablet by mouth 2 times daily (before meals) 60 tablet 2   ??? guaiFENesin (MUCINEX) 600 MG extended release tablet Take 1,200 mg by mouth 2 times  daily     ??? alfuzosin (UROXATRAL) 10 MG extended release tablet Take 10 mg by mouth daily     ??? atorvastatin (LIPITOR) 40 MG tablet Take 40 mg by mouth daily     ??? metoprolol succinate (TOPROL XL) 50 MG extended release tablet Take 50 mg by mouth nightly      ??? albuterol sulfate (PROAIR RESPICLICK) 578 (90 Base) MCG/ACT aerosol powder inhalation 2 puffs every 6 hours as needed      ??? montelukast (SINGULAIR) 10 MG tablet 10 mg nightly        No current facility-administered medications on file prior to visit.     Interval history:08/23/19: Arrived alone to the clinic today. Reported that he feels much better other than arthritis and stability problems. Otherwise great life. Eats three meals a day. No bleeding or any rash. No chest pain, palpitations, dizziness. No abdominal pain, nausea, emesis, diarrhea or any constipation. Exercising three times a week in their wellness centre. Feels sob, has h/o seasonal allergies. Cough productive of clear mucus. Has been started on Zertec which has been helping a little.    Review of Systems:    Constitutional:  No weight loss, No fever, No chills, No night sweats.  Energy level fair  Eyes:  No diplopia, No transient or permanent loss of vision, No scotomata.  ENT / Mouth:  No epistaxis, No dysphagia, No hoarseness, No oral ulcers, No gingival bleeding.  No sore throat, No postnasal drip, No nasal drip, No mouth pain, No sinus pain, No tinnitus, Normal hearing.  Cardiovascular:  No chest pain, No palpitations, No syncope, No upper extremity edema, No lower extremity edema, No calf discomfort.  Respiratory:  No cough.  No hemoptysis, No pleurisy, No wheezing, No dyspnea.  Gastrointestinal:  No abdominal pain, No abdominal cramping, No nausea, No vomiting, No constipation, No diarrhea, No hematochezia, No melena, No jaundice, No dyspepsia, No dysphagia.  Urinary:  No dysuria, No hematuria, No urinary incontinence.  Skin:  No rash, No nodules, No pruritus, No lesions.  Neurologic:   No confusion, No seizures, No syncope, No tremor, No speech change, No headache, No hiccups, No abnormal gait, No sensory changes, No weakness.     Vital Signs: BP 120/63 (Site: Left Upper Arm, Position: Sitting, Cuff Size: Medium Adult)    Pulse 65    Temp 97.5 ??F (36.4 ??C) (Infrared)    Ht _0  (1.702 m)    Wt 174 lb 9.6 oz (79.2 kg)    SpO2 95%    BMI 27.35 kg/m??      Physical Exam:  CONSTITUTIONAL: awake, alert, cooperative, very talkative, Uses walker for ambulation.   EYES:PEAR, no palor  or any icetrus  HQI:ONGE  NECK: No JVD  HEMATOLOGIC/LYMPHATIC: no cervical, supraclavicular or axillary lymphadenopathy   LUNGS: CTAB  CARDIOVASCULAR: s1s2 irr irr ESM  ABDOMEN: soft ntnd bs pos  NEUROLOGIC: GI  SKIN: No rash  EXTREMITIES: no LE edema bilaterally.      Labs:  Hematology:  Lab Results   Component Value Date    WBC 7.9 08/16/2019    RBC 3.59 (L) 08/16/2019    HGB 10.4 (L) 08/16/2019    HCT 31.4 (L) 08/16/2019    MCV 87.5 08/16/2019    MCH 29.0 08/16/2019    MCHC 33.1 08/16/2019    RDW 21.4 (H) 08/16/2019    PLT 74 (L) 08/16/2019    MPV 11.0 08/16/2019    BANDSPCT 4 (L) 02/28/2019    SEGSPCT 65.1 08/16/2019    EOSRELPCT 0.6 08/16/2019    BASOPCT 0.6 08/16/2019    LYMPHOPCT 20.3 (L) 08/16/2019    MONOPCT 13.4 (H) 08/16/2019    BANDABS 0.50 02/28/2019    SEGSABS 5.2 08/16/2019    EOSABS 0.1 08/16/2019    BASOSABS 0.1 08/16/2019    LYMPHSABS 1.6 08/16/2019    MONOSABS 1.1 08/16/2019    DIFFTYPE AUTOMATED DIFFERENTIAL 08/16/2019    WBCMORP OCCASIONAL 02/28/2019     Lab Results   Component Value Date    ESR 5 02/20/2019     Chemistry:  Lab Results   Component Value Date    NA 139 08/16/2019    K 4.8 08/16/2019    CL 102 08/16/2019    CO2 23 08/16/2019    BUN 18 08/16/2019    CREATININE 1.0 08/16/2019    GLUCOSE 93 08/16/2019    CALCIUM 8.6 08/16/2019    PROT 6.2 (L) 08/16/2019    LABALBU 4.7 08/16/2019    BILITOT 0.9 08/16/2019    ALKPHOS 71 08/16/2019    AST 15 08/16/2019    ALT 9 (L) 08/16/2019    LABGLOM >60  08/16/2019    GFRAA >60 08/16/2019    MG 1.9 03/02/2019    POCGLU 99 02/27/2019     Lab Results   Component Value Date    LDH 170 05/24/2019     No components found for: LD  Lab Results   Component Value Date    TSHHS 1.760 05/24/2019     Immunology:  Lab Results   Component Value Date    PROT 6.2 (L) 08/16/2019    SPEP  03/29/2019     INTERPRETATION - Slightly decreased total proteins.  LP.    ALBUMINELP 3.5 03/29/2019    LABALPH 0.3 03/29/2019    LABALPH 0.6 03/29/2019    LABBETA 0.7 03/29/2019    GAMGLOB 0.9 03/29/2019     No results found for: KAPPAUVOL, LAMBDAUVOL, KLFLCR  No results found for: B2M  Coagulation Panel:  Lab Results   Component Value Date    PROTIME 17.0 (H) 03/03/2019    INR 1.40 03/03/2019    APTT 46.4 (H) 03/03/2019     Anemia Panel:  Lab Results   Component Value Date    VITAMINB12 675.9 08/16/2019    FOLATE 9.9 08/16/2019     Tumor Markers:  No results found for: CA125, CEA, CA199, LABCA2, PSA     Observations:  ECOG:  No data recorded       Assessment & Plan:  Chronic thrombocytopenia: Etiology could be secondary to underlying MDS vs ITP.  Cytogenetics in the past were normal, blast being 2% his IPSS score was about 1.5 which is a low score.  Platelet 74K. Continue to monitor for now and consider repeat BMB/aspiration for further evaluation and treatment plan if counts declining.    Normocytic anemia: Could be sec to MDS. Other w/u neg basically. Not been on any oral iron or B12 supplements. Consider further evaluation with repeat BMB/aspirate if Hb declining. Consider ESA if Hb <10     Monoclonal gammopathy noted to have small IgG lambda band on the immunofixation but normal serum protein electrophoresis.  Normal renal function normal calcium levels.  Serum immunoglobulins and serum free light chains were normal.  No evidence of any myeloma defining disease.  Will repeat in a year in 2022 feb    Paroxysmal atrial fibrillation.  Has  pacemaker.  Is on Eliquis 2.5 mg po bid.   Continue as long as a platelet count of more than 60 K.  Avoid falls, deep cuts and monitor closely for any bleeding.     H/o Colon cancer: s/p resection in 2000, did not receive any adjuvant treatment. Also Family history of colon cancer.  Reported that he is up-to-date with the colonoscopy.    H/O Prostate cancer: Diagnosed in 2010. S/P XRT. Is being followed by Urology.     Family history ovarian cancer.  No genetic testing has been done and we discussed about that    Continue other medical care.    Discussed above findings and plan with him and he voiced understanding.  Answered all his questions.    Discussed healthy lifestyle including healthy diet, regular exercise as tolerated.  Also discussed importance of being up-to-date with age-appropriate screening tools.    Recommend follow-up with primary care physician and other specialists.    Please do not hesitate to contact us if you need further information.    Return to clinic Jan 2022 or earlier if new symptoms.    Recived COVID vaccine    JC

## 2019-08-23 NOTE — Progress Notes (Signed)
MA Rooming Questions  Patient: Dalton Warner  MRN: [id number]    Date: 08/23/2019        1. Do you have any new issues?   no         2. Do you need any refills on medications?    no    3. Have you had any imaging done since your last visit?   no    4. Have you been hospitalized or seen in the emergency room since your last visit here?   no    5. Did the patient have a depression screening completed today? No    No data recorded     PHQ-9 Given to (if applicable):               PHQ-9 Score (if applicable):                     []  Positive     []   Negative              Does question #9 need addressed (if applicable)                     []  Yes    []   No               Darylene Price, CMA

## 2019-09-10 ENCOUNTER — Ambulatory Visit: Payer: MEDICARE | Attending: Cardiovascular Disease | Primary: Family

## 2019-09-10 DIAGNOSIS — Z950 Presence of cardiac pacemaker: Secondary | ICD-10-CM

## 2019-09-13 ENCOUNTER — Encounter: Attending: Internal Medicine | Primary: Family

## 2019-09-13 ENCOUNTER — Ambulatory Visit: Admit: 2019-09-13 | Discharge: 2019-09-13 | Payer: MEDICARE | Attending: Internal Medicine | Primary: Family

## 2019-09-13 DIAGNOSIS — I4891 Unspecified atrial fibrillation: Secondary | ICD-10-CM

## 2019-09-13 NOTE — Progress Notes (Addendum)
[name], MD                                  CARDIOLOGY  NOTE         Chief Complaint:    Chief Complaint   Patient presents with   ??? 6 Month Follow-Up        Tolerating oral anticoagulation well  No spontaneous bleeding noted  Denies any chest pain shortness of breath  No melena or hematochezia    Prior HPI:     Dalton Warner is a 86 y.o. year old male who was seen in the hospital for chronic atrial fibrillation.  Patient has history of chronic thrombocytopenia.  He has recently moved into town to take care of his wife.  Patient also has a history of LV thrombus in the past.    Chronic atrial fibrillation  CHA2DS2-VASc score is 6     EKG: Atrial fibrillation at 66 bpm      Current Outpatient Medications   Medication Sig Dispense Refill   ??? triamcinolone (KENALOG) 0.1 % cream      ??? ammonium lactate (AMLACTIN) 12 % cream      ??? cetirizine (ZYRTEC) 10 MG tablet Take 10 mg by mouth daily     ??? apixaban (ELIQUIS) 2.5 MG TABS tablet Take 1 tablet by mouth 2 times daily 180 tablet 3   ??? pantoprazole (PROTONIX) 40 MG tablet Take 1 tablet by mouth 2 times daily (before meals) 60 tablet 2   ??? guaiFENesin (MUCINEX) 600 MG extended release tablet Take 1,200 mg by mouth 2 times daily     ??? alfuzosin (UROXATRAL) 10 MG extended release tablet Take 10 mg by mouth daily     ??? atorvastatin (LIPITOR) 40 MG tablet Take 40 mg by mouth daily     ??? metoprolol succinate (TOPROL XL) 50 MG extended release tablet Take 50 mg by mouth nightly      ??? albuterol sulfate (PROAIR RESPICLICK) 161 (90 Base) MCG/ACT aerosol powder inhalation 2 puffs every 6 hours as needed      ??? montelukast (SINGULAIR) 10 MG tablet 10 mg nightly        No current facility-administered medications for this visit.       Allergies:     Cat hair extract, Dust mite extract, Other, and Fish allergy    Patient History:    Past Medical History:   Diagnosis Date   ??? Arthritis    ??? Atrial fibrillation ([place])    ??? Bleeding ulcer 08/2018   ??? Cancer [facility])     Prostate cancer  dx March 2010- tx with radiation    ??? Colon cancer ([place]) 2000    per old chart dx with colon ca - tx with surgery only   ??? Diabetes mellitus ([place])     "was borderline- at one time was on Metformin for several yrs-  when I went to [facility] they had me stop this in 2017"   ??? Fracture     "had broken neck in Nov 2016- wore a collar for 6 weeks only"   ??? HOH (hard of hearing)     bil hearing aide   ??? Hx of blood clots     "had blood clot - it was a  blood clot in  my esophagus "   ??? Hx of fall     "last fall 2016- use cane walker  and have electric chair"   ??? Hyperlipidemia    ??? Hypertension     follow with Dr [facility] Cald[facility]   ??? Limited range of motion (ROM) of shoulder     "right shoulder haved 10 % usage and left shoulder 60 % of usage"   ??? Neuropathy     both feet   ??? Sleep apnea     "had sleep study- tried to give me cpap but could not tolerate it "   ??? TIA (transient ischemic attack)     'in Jan 2009- trouble with speech, numbness of lip, weakness right arm - all only lasted for 20 seconds"   ??? Unsteady gait     "hx of back problem- was on steroids for 4 yrs- since then stability issues with my back -    ??? Wears glasses     to read      Past Surgical History:   Procedure Laterality Date   ??? ABDOMEN SURGERY  2000    per old chart had right hemicolectomy "removed 2 and a half feet of colon and my appendix"   ??? COLONOSCOPY  2019   ??? ENDOSCOPY, COLON, DIAGNOSTIC      per old chart had EGD done 08/2018 and 09/2018   ??? HERNIA REPAIR      per old chart open left ing hernia 1980 and then recurrent left ing hernia repair 08/2018   ??? JOINT REPLACEMENT      per old chart total left knee 2018   ??? PACEMAKER PLACEMENT      per old chart hx of [facility] pacer insertion 2008- new pacer inserted 2019   ??? SHOULDER SURGERY Left 2015    rota. cuff   ??? SHOULDER SURGERY Right 02/27/2019    RIGHT REVERSE SHOULDER TOTAL ARTHROPLASTY REVERSE performed by Avel Sensor, MD at [facility] OR     Family History   Problem Relation Age of  Onset   ??? Cancer Mother         lung cancer   ??? Diabetes Mother    ??? Diabetes Sister    ??? Cancer Brother    ??? High Blood Pressure Brother      Social History     Tobacco Use   ??? Smoking status: Never Smoker   ??? Smokeless tobacco: Never Used   Substance Use Topics   ??? Alcohol use: Yes     Comment: "maybe 6 times per year" "when had prostate cancer did drink one glass red wine daily in the past        Review of Systems:     ?? Constitutional:  No Fever or Weight Loss   ?? Eyes: No Decreased Vision  ?? ENT: No Headaches, Hearing Loss or Vertigo  ?? Cardiovascular: No Chest Pain,  No Shortness of breath, No Palpitations. No Edema   ?? Respiratory: No cough or wheezing . No Respiratory distress   ?? Gastrointestinal: No abdominal pain, appetite loss, blood in stools, constipation, diarrhea or heartburn  ?? Genitourinary: No dysuria, trouble voiding, or hematuria  ?? Musculoskeletal:  denies any new  joint aches , or pain   ?? Integumentary: No rash or pruritis  ?? Neurological: No TIA or stroke symptoms  ?? Psychiatric: No anxiety or depression  ?? Endocrine: No malaise, fatigue or temperature intolerance  ?? Hematologic/Lymphatic: No bleeding problems, blood clots or swollen lymph nodes  ?? Allergic/Immunologic: No nasal congestion or hives  Objective:      Physical Exam:    BP 124/76    Pulse 60    Ht 5\' 7"  (1.702 m)    Wt 176 lb (79.8 kg)    BMI 27.57 kg/m??   Wt Readings from Last 3 Encounters:   09/13/19 176 lb (79.8 kg)   08/23/19 174 lb 9.6 oz (79.2 kg)   05/24/19 166 lb (75.3 kg)     Body mass index is 27.57 kg/m??.  Vitals:    09/13/19 0836   BP: 124/76   Pulse: 60        General Appearance and Constitutional: Conversant, Well developed, Well nourished, No acute distress, Non-toxic appearance.   HEENT:  Normocephalic, Atraumatic, Bilateral external ears normal, Oropharynx moist, No oral exudates,   Nose normal.   Neck- Normal range of motion, No tenderness, Supple  Eyes:  EOMI, Conjunctiva normal, No discharge.    Respiratory:  Normal breath sounds, No respiratory distress, No wheezing, No Rales, No Ronchi.  No chest tenderness.   Cardiovascular: S1-S2 IRIR, no added heart sounds, No Mumurs appreciated. No gallops, rubs. No Pedal Edema   GI:  Bowel sounds normal, Soft, No tenderness,  GU:  No costovertebral angle tenderness   Musculoskeletal:  No gross deformities. Back- No tenderness  Integument:  Well hydrated, no rash   Lymphatic:  No lymphadenopathy noted   Neurologic:  Alert & oriented x 3, Normal motor function, normal sensory function, no focal deficits noted   Psychiatric:  Speech and behavior appropriate       Medical decision making and Data review:    DATA:    No results found for: TROPONINT  BNP:  No results found for: PROBNP  PT/INR:  No results found for: PTINR  No results found for: LABA1C  No results found for: CHOL, TRIG, HDL, LDLCALC, LDLDIRECT  Lab Results   Component Value Date    WBC 7.9 08/16/2019    HGB 10.4 (L) 08/16/2019    HCT 31.4 (L) 08/16/2019    MCV 87.5 08/16/2019    PLT 74 (L) 08/16/2019     TSH: No results found for: TSH  Lab Results   Component Value Date    AST 15 08/16/2019    ALT 9 (L) 08/16/2019    BILITOT 0.9 08/16/2019    ALKPHOS 71 08/16/2019         All labs, medications and tests reviewed by myself including data and history from outside source , patient and available family .      1. Atrial fibrillation, unspecified type ([place])    2. Anemia, unspecified type    3. Essential hypertension    4. Sick sinus syndrome ([place])    5. LV (left ventricular) mural thrombus    6. Dyslipidemia         Impression and Plan:    1. Chronic atrial fibrillation,??with history of TIA as well as hx of LV thrombus. ??CHA2DS2-VASc score is > 6. on low-dose Eliquis understanding high risk of ischemic stroke versus bleeding risk. Cont BB  - Obtain Echo with definity to evaluate LV apex   2. Anemia as well as some thrombocytopenia. ??Patient evaluated by GI in hospital ??Anemia is presumed to be surgical blood  loss. Last Hb 10.4 Pt has ch. Thrombocytopenia ( 74) and can be observed on [place].   3. Hyperlipidemia: Cont lipitor 40 mg daily - check lipid panel   4. HTN: Cont Metoprolol. BP well controlled.   5. SSS s.p PPM .  Follow up with pacer clinic as outpt??- Pacemaker report reviewed 09/10/2019      Return in about 6 months (around 03/15/2020).          Counseled extensively and medication compliance urged.  We discussed that for the  prevention of ASCVD our  goal is aggressive risk modification.Patient is encouraged to exercise even a brisk walk for 30 minutes  at least 3 to 4 times a week   Various goals were discussed and questions answered. Continue current medications. Appropriate prescriptions are addressed and refills ordered.  Questions answered and patient verbalizes understanding.  Call for any problems, questions, or concerns.

## 2019-09-13 NOTE — Addendum Note (Signed)
Addended by: [name] on: 09/13/2019 09:24 AM     Modules accepted: Orders

## 2019-09-13 NOTE — Patient Instructions (Signed)
Please be informed that if you contact our office outside of normal business hours the physician on call cannot help with any scheduling or rescheduling issues, procedure instruction questions or any type of medication issue.    We advise you for any urgent/emergency that you go to the nearest emergency room!    PLEASE CALL OUR OFFICE DURING NORMAL BUSINESS HOURS    Monday - Friday   8 am to 5 pm    [place]: [phone]    [place]: [phone]    [place]:  [phone]

## 2019-09-13 NOTE — Progress Notes (Signed)
Pt did not bring medications or medication list to todays appt

## 2019-09-19 ENCOUNTER — Ambulatory Visit: Admit: 2019-09-19 | Discharge: 2019-09-19 | Payer: MEDICARE | Primary: Family

## 2019-09-19 DIAGNOSIS — I4891 Unspecified atrial fibrillation: Secondary | ICD-10-CM

## 2019-09-19 LAB — ECHOCARDIOGRAM COMPLETE 2D W DOPPLER W COLOR: Left Ventricular Ejection Fraction: 58

## 2019-09-19 NOTE — Progress Notes (Signed)
ECHO COMPLETED

## 2019-09-20 NOTE — Telephone Encounter (Signed)
ECHO        Summary   Left ventricular function is normal, EF is estimated at 55-60%.   Mild left ventricular hypertrophy.   Diastolic dysfunction could not be evaluated due to arrhythmia.   Abnormal (paradoxical) motion consistent with RV pacemaker.   No regional wall motion abnormalities were detected.   Bi atrial enlargement noted.   Sclerotic, but non-stenotic aortic valve.   Mitral annular calcification is present.   Moderate mitral, aortic, tricuspid and mild pulmonic regurgitation is   present.   Mild pulmonary hypertension with an RVSP of 69mmHg.   Dilation of the aortic root(4.0) and the ascending aorta(4.2).   No evidence of pericardial effusion.    LMFPTCO      RETURNED CALL AND RESULTS GIVEN

## 2019-10-18 LAB — LIPID PANEL
Cholesterol, Total: 80 mg/dL
HDL: 31 mg/dL — AB (ref 35–70)
LDL Calculated: 35 mg/dL (ref 0–160)
Triglycerides: 62 mg/dL

## 2019-10-26 NOTE — Telephone Encounter (Signed)
Patient advised results received. Will forward to PCP

## 2019-10-26 NOTE — Telephone Encounter (Signed)
Pt called and states Dr Izetta Dakin gave order on a script for lab work. [facility] states they just faxed report. Please advise

## 2019-10-26 NOTE — Telephone Encounter (Signed)
Patient called to see if we had lab results from [employer], told him no, he will try to get results and get them to [place].

## 2019-10-26 NOTE — Telephone Encounter (Signed)
noted 

## 2020-01-08 ENCOUNTER — Ambulatory Visit: Attending: Cardiovascular Disease | Primary: Family

## 2020-01-08 DIAGNOSIS — Z950 Presence of cardiac pacemaker: Secondary | ICD-10-CM

## 2020-01-13 ENCOUNTER — Inpatient Hospital Stay: Admit: 2020-01-13 | Discharge: 2020-01-14 | Disposition: A | Discharge status: Home / Self Care | Payer: MEDICARE

## 2020-01-13 ENCOUNTER — Emergency Department: Admit: 2020-01-14 | Payer: MEDICARE | Primary: Family

## 2020-01-13 DIAGNOSIS — D696 Thrombocytopenia, unspecified: Secondary | ICD-10-CM

## 2020-01-13 NOTE — ED Notes (Signed)
Call light damaged in room. Maintenance called. Gone for the day     Gershon Crane, RN  01/13/20 2240

## 2020-01-13 NOTE — ED Notes (Signed)
Report given to [employer].     Excell Seltzer, RN  01/13/20 2134

## 2020-01-13 NOTE — ED Provider Notes (Signed)
Triage Chief Complaint:   Other (bilateral hands and feet cold, reports hx of low platelets and hgb)    HOPI:  Today in the ED I had the pleasure of caring for Dalton Warner who is a 86 y.o. male that presents to the emergency department for evaluation.  Patient states that over the last 3 days his hands and felt very cold and his feet have been very cold bilateral.  And he is concerned because he does have a history of a low platelet count and thinks that it may be associated with one another.     During review of systems.  Patient does cite that he has a history of gastric reflux.  Which is constantly burning him in the throat.  He has to sleep sitting up.    He also mentions that 3 days ago he had sensation of palpitations and chest.  With no associated pain or shortness of breath lightheadedness or dizziness back pain flank pain abdominal pain.  No generalized weakness associated with it.  And then earlier today he had another episode where he felt like he had sensation of palpitations that lasted for several seconds.  Once again patient denies any chest pain or shortness of breath at this time.  No extremity pain jaw pain.  Back pain.  ROS:  REVIEW OF SYSTEMS    At least 10 systems reviewed      All other review of systems are negative  See HPI and nursing notes for additional information       Past Medical History:   Diagnosis Date   ??? Arthritis    ??? Atrial fibrillation ([place])    ??? Bleeding ulcer 08/2018   ??? Cancer [facility])     Prostate cancer dx March 2010- tx with radiation    ??? Colon cancer ([place]) 2000    per old chart dx with colon ca - tx with surgery only   ??? Diabetes mellitus ([place])     "was borderline- at one time was on Metformin for several yrs-  when I went to [facility] they had me stop this in 2017"   ??? Fracture     "had broken neck in Nov 2016- wore a collar for 6 weeks only"   ??? HOH (hard of hearing)     bil hearing aide   ??? Hx of blood clots     "had blood clot - it was a  blood clot in  my  esophagus "   ??? Hx of Doppler ultrasound 09/19/2019    EF IS 55-60%  Bi atrial enlargement noted.  Moderate mitral, aortic, tricuspid and mild pulmonic regurgitation is present   ??? Hx of fall     "last fall 2016- use cane walker and have electric chair"   ??? Hyperlipidemia    ??? Hypertension     follow with Dr Freeman Caldron at [facility]   ??? Limited range of motion (ROM) of shoulder     "right shoulder haved 10 % usage and left shoulder 60 % of usage"   ??? Neuropathy     both feet   ??? Sleep apnea     "had sleep study- tried to give me cpap but could not tolerate it "   ??? TIA (transient ischemic attack)     'in Jan 2009- trouble with speech, numbness of lip, weakness right arm - all only lasted for 20 seconds"   ??? Unsteady gait     "hx of back problem-  was on steroids for 4 yrs- since then stability issues with my back -    ??? Wears glasses     to read      Past Surgical History:   Procedure Laterality Date   ??? ABDOMEN SURGERY  2000    per old chart had right hemicolectomy "removed 2 and a half feet of colon and my appendix"   ??? COLONOSCOPY  2019   ??? ENDOSCOPY, COLON, DIAGNOSTIC      per old chart had EGD done 08/2018 and 09/2018   ??? HERNIA REPAIR      per old chart open left ing hernia 1980 and then recurrent left ing hernia repair 08/2018   ??? JOINT REPLACEMENT      per old chart total left knee 2018   ??? PACEMAKER PLACEMENT      per old chart hx of st jude pacer insertion 2008- new pacer inserted 2019   ??? SHOULDER SURGERY Left 2015    rota. cuff   ??? SHOULDER SURGERY Right 02/27/2019    RIGHT REVERSE SHOULDER TOTAL ARTHROPLASTY REVERSE performed by Avel Sensor, MD at [facility] OR     Family History   Problem Relation Age of Onset   ??? Cancer Mother         lung cancer   ??? Diabetes Mother    ??? Diabetes Sister    ??? Cancer Brother    ??? High Blood Pressure Brother      Social History     Socioeconomic History   ??? Marital status: Married     Spouse name: Not on file   ??? Number of children: Not on file   ??? Years of education: Not on  file   ??? Highest education level: Not on file   Occupational History   ??? Not on file   Tobacco Use   ??? Smoking status: Never Smoker   ??? Smokeless tobacco: Never Used   Vaping Use   ??? Vaping Use: Never used   Substance and Sexual Activity   ??? Alcohol use: Yes     Comment: "maybe 6 times per year" "when had prostate cancer did drink one glass red wine daily in the past   ??? Drug use: Never   ??? Sexual activity: Not on file   Other Topics Concern   ??? Not on file   Social History Narrative   ??? Not on file     Social Determinants of Health     Financial Resource Strain:    ??? Difficulty of Paying Living Expenses: Not on file   Food Insecurity:    ??? Worried About Running Out of Food in the Last Year: Not on file   ??? Ran Out of Food in the Last Year: Not on file   Transportation Needs:    ??? Lack of Transportation (Medical): Not on file   ??? Lack of Transportation (Non-Medical): Not on file   Physical Activity:    ??? Days of Exercise per Week: Not on file   ??? Minutes of Exercise per Session: Not on file   Stress:    ??? Feeling of Stress : Not on file   Social Connections:    ??? Frequency of Communication with Friends and Family: Not on file   ??? Frequency of Social Gatherings with Friends and Family: Not on file   ??? Attends Religious Services: Not on file   ??? Active Member of Clubs or Organizations: Not on file   ???  Attends Archivist Meetings: Not on file   ??? Marital Status: Not on file   Intimate Partner Violence:    ??? Fear of Current or Ex-Partner: Not on file   ??? Emotionally Abused: Not on file   ??? Physically Abused: Not on file   ??? Sexually Abused: Not on file   Housing Stability:    ??? Unable to Pay for Housing in the Last Year: Not on file   ??? Number of Places Lived in the Last Year: Not on file   ??? Unstable Housing in the Last Year: Not on file     No current facility-administered medications for this encounter.     Current Outpatient Medications   Medication Sig Dispense Refill   ??? triamcinolone (KENALOG) 0.1 %  cream      ??? ammonium lactate (AMLACTIN) 12 % cream      ??? cetirizine (ZYRTEC) 10 MG tablet Take 10 mg by mouth daily     ??? apixaban (ELIQUIS) 2.5 MG TABS tablet Take 1 tablet by mouth 2 times daily 180 tablet 3   ??? pantoprazole (PROTONIX) 40 MG tablet Take 1 tablet by mouth 2 times daily (before meals) 60 tablet 2   ??? guaiFENesin (MUCINEX) 600 MG extended release tablet Take 1,200 mg by mouth 2 times daily     ??? alfuzosin (UROXATRAL) 10 MG extended release tablet Take 10 mg by mouth daily     ??? atorvastatin (LIPITOR) 40 MG tablet Take 40 mg by mouth daily     ??? metoprolol succinate (TOPROL XL) 50 MG extended release tablet Take 50 mg by mouth nightly      ??? albuterol sulfate (PROAIR RESPICLICK) 341 (90 Base) MCG/ACT aerosol powder inhalation 2 puffs every 6 hours as needed      ??? montelukast (SINGULAIR) 10 MG tablet 10 mg nightly        Allergies   Allergen Reactions   ??? Cat Hair Extract Other (See Comments)     Runny nose   ??? Dust Mite Extract Other (See Comments)     Runny nose   ??? Other      Other reaction(s): Other (See Comments)  Dust/cats - sneezing   ??? Fish Allergy Nausea And Vomiting     Salmon only  Salmon only       Nursing Notes Reviewed    Physical Exam:  ED Triage Vitals [01/13/20 1803]   Enc Vitals Group      BP 120/65      Pulse 90      Resp 16      Temp 98.9 ??F (37.2 ??C)      Temp Source Oral      SpO2 96 %      Weight       Height       Head Circumference       Peak Flow       Pain Score       Pain Loc       Pain Edu?       Excl. in [place]?      General :Patient is awake alert oriented person place and time no acute distress nontoxic appearing  HEENT: Pupils are equally round and reactive to light extraocular motors are intact conjunctivae clear sclerae white there is no injection no icterus. Nose without any rhinorrhea or epistaxis.   Oral mucosa is moist no exudate buccal mucosa shows no ulcerations. Uvula is midline    Neck: Neck  is supple full range of motion trachea midline thyroid  nonpalpable  Cardiac: Heart regular rate rhythm no murmurs rubs clicks or gallops  Lungs: Lungs are clear to auscultation there is no wheezing rhonchi or rales. There is no use of accessory muscles no nasal flaring identified.  Chest wall: There is no tenderness to palpation over the chest wall or over ribs  Abdomen: Abdomen is soft nontender nondistended. There is no firm or pulsatile masses no rebound rigidity or guarding negative Murphy's negative McBurney, no peritoneal signs  Suprapubic:  there is no tenderness to palpation over the external bladder   Musculoskeletal: 5 out of 5 strength in all 4 extremities full flexion extension abduction and adduction supination pronation of all extremities and all digits. No obvious muscle atrophy is noted. No focal muscle deficits are appreciated.  Bilateral radial ulnar pulse 2+.  Bilateral dorsalis pedis, posterior talus pulse 2+.  In all 20 digits capillary refill is swift.  Distal sensation is intact  Dermatology: Skin is warm and dry there is no obvious abscesses lacerations or lesions noted  Psych: Mentation is grossly normal cognition is grossly normal. Affect is appropriate  Neuro: Motor intact sensory intact cranial nerves II through XII are intact level of consciousness is normal cerebellar function is normal reflexes are grossly normal. No evidence of incontinence or loss of bowel or bladder no saddle anesthesia noted Lymphatic: There is no submandibular or cervical adenopathy appreciated.        I have reviewed and interpreted all of the currently available lab results from this visit (if applicable):  Results for orders placed or performed during the hospital encounter of 01/13/20   CBC Auto Differential   Result Value Ref Range    WBC 12.2 (H) 4.0 - 10.5 K/CU MM    RBC 3.30 (L) 4.6 - 6.2 M/CU MM    Hemoglobin 9.6 (L) 13.5 - 18.0 GM/DL    Hematocrit 29.5 (L) 42 - 52 %    MCV 89.4 78 - 100 FL    MCH 29.1 27 - 31 PG    MCHC 32.5 32.0 - 36.0 %    RDW 20.5 (H)  11.7 - 14.9 %    Platelets 63 (L) 140 - 440 K/CU MM    MPV 10.9 7.5 - 11.1 FL    Segs Relative 87.0 (H) 36 - 66 %    Lymphocytes % 8.0 (L) 24 - 44 %    Monocytes % 5.0 (H) 0 - 4 %    nRBC 1     Segs Absolute 10.6 K/CU MM    Lymphocytes Absolute 1.0 K/CU MM    Monocytes Absolute 0.6 K/CU MM    Differential Type MANUAL DIFFERENTIAL     Anisocytosis 1+     Ovalocytes 1+     Burr Cells 1+    Comprehensive Metabolic Panel   Result Value Ref Range    Sodium 130 (L) 135 - 145 MMOL/L    Potassium 4.2 3.5 - 5.1 MMOL/L    Chloride 99 99 - 110 mMol/L    CO2 20 (L) 21 - 32 MMOL/L    BUN 21 6 - 23 MG/DL    CREATININE 1.2 0.9 - 1.3 MG/DL    Glucose 157 (H) 70 - 99 MG/DL    Calcium 8.4 8.3 - 10.6 MG/DL    Albumin 3.9 3.4 - 5.0 GM/DL    Total Protein 6.4 6.4 - 8.2 GM/DL    Total Bilirubin 1.3 (H) 0.0 - 1.0  MG/DL    ALT 11 10 - 40 U/L    AST 14 (L) 15 - 37 IU/L    Alkaline Phosphatase 70 40 - 129 IU/L    GFR Non-African American 57 (L) >60 mL/min/1.19m2    GFR African American >60 >60 mL/min/1.54m2    Anion Gap 11 4 - 16   Troponin   Result Value Ref Range    Troponin T <0.010 <0.01 NG/ML   Lipase   Result Value Ref Range    Lipase 28 13 - 60 IU/L   Brain Natriuretic Peptide   Result Value Ref Range    Pro-BNP 2,032 (H) <300 PG/ML   Magnesium   Result Value Ref Range    Magnesium 1.9 1.8 - 2.4 mg/dl   EKG 12 Lead   Result Value Ref Range    Ventricular Rate 80 BPM    Atrial Rate 326 BPM    QRS Duration 136 ms    Q-T Interval 384 ms    QTc Calculation (Bazett) 442 ms    R Axis 52 degrees    T Axis -33 degrees    Diagnosis       Atrial fibrillation  Right bundle branch block  T wave abnormality, consider inferolateral ischemia  Abnormal ECG  When compared with ECG of 20-Feb-2019 15:17,  Atrial fibrillation has replaced Electronic ventricular pacemaker        Radiographs (if obtained):  []  The following radiograph was interpreted by myself in the absence of a radiologist:   []  Radiologist's Report Reviewed:  XR CHEST PORTABLE   Final  Result   Small left pleural effusion with adjacent consolidation, atelectasis versus   pneumonia.             EKG (if obtained):   Please See Note of attending physician for EKG interpretation.     Chart review shows recent radiograph(s):  No results found.    MDM:     Interventions given this visit:   Orders Placed This Encounter   Medications   ??? pantoprazole (PROTONIX) injection 40 mg       Very well-appearing gentleman presents today to the ED with cold hands and cold feet.  Hands are warm on physical examination he is vascularly intact.  The right pedal pulses great radial and ulnar pulses.  Good sensation throughout.  Cap refill.  Blood work was drawn.  Shows chronic thrombocytopenia.  As well as a small pleural effusion patient did not have any chest pain or shortness of breath.  This pleural effusion can be managed on outpatient basis patient is made aware of these findings.  Low suspicion of ACS, malperfusion, other.  I have discussed the findings of today's workup with the patient and present family members and have addressed their questions and concerns. Important warning signs as well as new or worsening symptoms which would necessitate immediate return to the ED were discussed. The plan is to discharge from the ED at this time, and the patient is in stable condition. The patient acknowledged understanding is agreeable with this plan.The patient and/or family and I have discussed the diagnosis and risks, and we agree with discharging home to follow-up with their primary care, specialist or referral doctor. Questions addressed. Disposition and follow-up agreed upon. Specific discharge instructions explained.  We have discussed the symptoms which are most concerning that necessitate immediate return. We also discussed returning to the Emergency Department immediately if new or worsening symptoms occur.  I independently managed patient today in the ED    BP (!) 143/106    Pulse 83    Temp 98.9 ??F  (37.2 ??C) (Oral)    Resp 29    SpO2 96%       Clinical Impression:  1. Thrombocytopenia ([place])    2. Pleural effusion    3. Cold hands and feet        Disposition referral (if applicable):  Carloyn Manner, APRN - CNP  [place] [address]  [phone]    In 2 days      Disposition medications (if applicable):  New Prescriptions    No medications on file         Comment: Please note this report has been produced using speech recognition software and may contain errors related to that system including errors in grammar, punctuation, and spelling, as well as words and phrases that may be inappropriate. If there are any questions or concerns please feel free to contact the dictating provider for clarification.      Ambrose Pancoast, [place], [place]  01/13/20 2245

## 2020-01-14 LAB — CBC WITH AUTO DIFFERENTIAL
Hematocrit: 29.5 % — ABNORMAL LOW (ref 42–52)
Hemoglobin: 9.6 GM/DL — ABNORMAL LOW (ref 13.5–18.0)
Lymphocytes %: 8 % — ABNORMAL LOW (ref 24–44)
Lymphocytes Absolute: 1 10*3/uL
MCH: 29.1 PG (ref 27–31)
MCHC: 32.5 % (ref 32.0–36.0)
MCV: 89.4 FL (ref 78–100)
MPV: 10.9 FL (ref 7.5–11.1)
Monocytes %: 5 % — ABNORMAL HIGH (ref 0–4)
Monocytes Absolute: 0.6 10*3/uL
Platelets: 63 10*3/uL — ABNORMAL LOW (ref 140–440)
RBC: 3.3 10*6/uL — ABNORMAL LOW (ref 4.6–6.2)
RDW: 20.5 % — ABNORMAL HIGH (ref 11.7–14.9)
Segs Absolute: 10.6 10*3/uL
Segs Relative: 87 % — ABNORMAL HIGH (ref 36–66)
WBC: 12.2 10*3/uL — ABNORMAL HIGH (ref 4.0–10.5)
nRBC: 1

## 2020-01-14 LAB — COMPREHENSIVE METABOLIC PANEL
ALT: 11 U/L (ref 10–40)
AST: 14 IU/L — ABNORMAL LOW (ref 15–37)
Albumin: 3.9 GM/DL (ref 3.4–5.0)
Alkaline Phosphatase: 70 IU/L (ref 40–129)
Anion Gap: 11 (ref 4–16)
BUN: 21 MG/DL (ref 6–23)
CO2: 20 MMOL/L — ABNORMAL LOW (ref 21–32)
Calcium: 8.4 MG/DL (ref 8.3–10.6)
Chloride: 99 mMol/L (ref 99–110)
Creatinine: 1.2 MG/DL (ref 0.9–1.3)
GFR African American: >60 mL/min/{1.73_m2} (ref >60)
GFR Non-African American: 57 mL/min/{1.73_m2} — ABNORMAL LOW (ref >60)
Glucose: 157 MG/DL — ABNORMAL HIGH (ref 70–99)
Potassium: 4.2 MMOL/L (ref 3.5–5.1)
Sodium: 130 MMOL/L — ABNORMAL LOW (ref 135–145)
Total Bilirubin: 1.3 MG/DL — ABNORMAL HIGH (ref 0.0–1.0)
Total Protein: 6.4 GM/DL (ref 6.4–8.2)

## 2020-01-14 LAB — MAGNESIUM: Magnesium: 1.9 mg/dl (ref 1.8–2.4)

## 2020-01-14 LAB — LIPASE: Lipase: 28 IU/L (ref 13–60)

## 2020-01-14 LAB — BRAIN NATRIURETIC PEPTIDE: Pro-BNP: 2032 PG/ML — ABNORMAL HIGH (ref <300)

## 2020-01-14 LAB — TROPONIN: Troponin T: <0.01 NG/ML (ref <0.01)

## 2020-01-14 MED ORDER — PANTOPRAZOLE SODIUM 40 MG IV SOLR
40 MG | Freq: Once | INTRAVENOUS | Status: AC
Start: 2020-01-14 — End: 2020-01-13
  Administered 2020-01-14: 02:00:00 40 mg via INTRAVENOUS

## 2020-01-14 MED FILL — PROTONIX 40 MG IV SOLR: 40 mg | INTRAVENOUS | Qty: 40

## 2020-01-15 ENCOUNTER — Ambulatory Visit: Admit: 2020-01-15 | Discharge: 2020-01-15 | Payer: MEDICARE | Attending: Internal Medicine | Primary: Family

## 2020-01-15 ENCOUNTER — Inpatient Hospital Stay: Admit: 2020-01-15 | Discharge: 2020-01-15 | Payer: MEDICARE | Primary: Family

## 2020-01-15 DIAGNOSIS — D472 Monoclonal gammopathy: Secondary | ICD-10-CM

## 2020-01-15 LAB — CBC WITH AUTO DIFFERENTIAL
Basophils %: 0.5 % (ref 0–1)
Basophils Absolute: 0.1 10*3/uL
Eosinophils %: 0.3 % (ref 0–3)
Eosinophils Absolute: 0 10*3/uL
Hematocrit: 27.2 % — ABNORMAL LOW (ref 42–52)
Hemoglobin: 9 GM/DL — ABNORMAL LOW (ref 13.5–18.0)
Lymphocytes %: 9.7 % — ABNORMAL LOW (ref 24–44)
Lymphocytes Absolute: 1.1 10*3/uL
MCH: 28.8 PG (ref 27–31)
MCHC: 33.1 % (ref 32.0–36.0)
MCV: 87.2 FL (ref 78–100)
Monocytes %: 15.5 % — ABNORMAL HIGH (ref 0–4)
Monocytes Absolute: 1.7 10*3/uL
Platelets: 54 10*3/uL — ABNORMAL LOW (ref 140–440)
RBC: 3.12 10*6/uL — ABNORMAL LOW (ref 4.6–6.2)
RDW: 21.4 % — ABNORMAL HIGH (ref 11.7–14.9)
Segs Absolute: 8.2 10*3/uL
Segs Relative: 74 % — ABNORMAL HIGH (ref 36–66)
WBC: 11 10*3/uL — ABNORMAL HIGH (ref 4.0–10.5)

## 2020-01-15 LAB — COMPREHENSIVE METABOLIC PANEL
ALT: 12 U/L (ref 10–40)
AST: 15 IU/L (ref 15–37)
Albumin: 3.9 GM/DL (ref 3.4–5.0)
Alkaline Phosphatase: 73 IU/L (ref 40–129)
Anion Gap: 13 (ref 4–16)
BUN: 26 MG/DL — ABNORMAL HIGH (ref 6–23)
CO2: 21 MMOL/L (ref 21–32)
Calcium: 8.6 MG/DL (ref 8.3–10.6)
Chloride: 99 mMol/L (ref 99–110)
Creatinine: 1.2 MG/DL (ref 0.9–1.3)
GFR African American: >60 mL/min/{1.73_m2} (ref >60)
GFR Non-African American: 57 mL/min/{1.73_m2} — ABNORMAL LOW (ref >60)
Glucose: 117 MG/DL — ABNORMAL HIGH (ref 70–99)
Potassium: 4.3 MMOL/L (ref 3.5–5.1)
Sodium: 133 MMOL/L — ABNORMAL LOW (ref 135–145)
Total Bilirubin: 1.7 MG/DL — ABNORMAL HIGH (ref 0.0–1.0)
Total Protein: 5.9 GM/DL — ABNORMAL LOW (ref 6.4–8.2)

## 2020-01-15 LAB — FERRITIN: Ferritin: 323 NG/ML (ref 30–400)

## 2020-01-15 LAB — IRON AND TIBC
Iron: 19 ug/dL — ABNORMAL LOW (ref 59–158)
TIBC: 218 ug/dL — ABNORMAL LOW (ref 250–450)
Transferrin %: 9 % — ABNORMAL LOW (ref 10–44)
UIBC: 199 ug/dL (ref 110–370)

## 2020-01-15 LAB — EKG 12-LEAD
Atrial Rate: 326 {beats}/min
Q-T Interval: 384 ms
QRS Duration: 136 ms
QTc Calculation (Bazett): 442 ms
R Axis: 52 degrees
T Axis: -33 degrees
Ventricular Rate: 80 {beats}/min

## 2020-01-15 LAB — VITAMIN B12 & FOLATE
Folate: 10.6 NG/ML (ref 3.1–17.5)
Vitamin B-12: 388 pg/ml (ref 211–911)

## 2020-01-15 NOTE — Progress Notes (Addendum)
Patient Name:  Dalton Warner  Patient DOB:  1930/09/10  Patient MRN:  [id number]     Primary Oncologist: Charisse Klinefelter, MD  Referring Provider: Carloyn Manner, APRN - CNP     Date of Service: 01/15/2020      Reason for Consult:  Thrombocytopenia     Chief Complaint:    Chief Complaint   Patient presents with   ??? Follow-up       Encounter Diagnoses   Name Primary?   ??? Monoclonal gammopathy Yes   ??? Severe anemia    ??? Thrombocytopenia ([place])         HPI:   11/22/18: Arrived alone to the clinic today. Loves to talk.  According to Dr. Hortense Ramal his oncologist at [facility] he was diagnosed with prostate cancer in 2010.  Received external beam radiation therapy.  He has been on Xarelto since 2017 for A. fib.  Prior to which he was on Coumadin since 2008.  Further work-up was negative for hepatitis B, hepatitis C and HIV.  No evidence of hemolysis.  B12 was on lower side.  Folate was normal.  Iron studies were normal.  Serum protein electrophoresis was normal.  But imaging of fixation revealed a small IgG lambda monoclonal band.  Was advised to be on B12 supplements.  Today at the office he reported that he had hernia surgery on July 24 when he received platelet transfusion.  Reported that his been caregiver to his wife for 20 years and is currently at [facility] home at the other mass unit.  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  Married for 67 years.  Lost a lot of weight in the process of taking care of his wife.  Reported tiredness.  No bleeding or any rash.  No chest pain.  Reported arthritis pain all the time.  Reported that he is taking oral B12 supplements thousand micrograms daily.    11/02/2016: CWC:[id number] review demonstrates a bone marrow that is approximately 30% cellular with trilineage hematopoiesis and 1-2% blasts based on the manual differential count, as confirmed by immunophenotypic studies. There are mild myeloid dyspoietic changes as well as minimal  erythroid atypia. Although reactive etiologies including medications, toxins and nutritional deficiencies must be excluded, the patient's cytopenias and bone marrow morphologic findings raise the possibility of an underlying myelodysplastic syndrome.  FISH/Cytogenetics: Normal    07/20/2018 CBC with WBC of 6.52 hemoglobin of 11.0 hematocrit 34.7 MCV of 93.3 platelets of 78 diff within normal limits    March 2020  platelet count was 81.    Jan 2020  it was 85K    09/2018: CT chest:1. Ectasia of the ascending thoracic aorta measuring 4.2 cm in short-axis diameter. ??This may be due to aortic stenosis considering the aortic valve calcifications present.  2. No evidence of aortic dissection.  3. Atherosclerotic calcification of the 3 major native coronary arteries.  4. Moderate dilation of cardiac chambers.  5. Evidence of prior granulomatous disease with no definite pneumonia or suspicious-appearing pulmonary nodules.  6. Trace right pleural effusion.  7. Mild fatty infiltration of the liver.  8. Small sliding-type hiatal hernia.    09/08/18: Ultrasound:1. Mild ascites evident in the left upper quadrant.  2. Nonvisualized pancreas and majority of the abdominal aorta due to overlying bowel gas.  3. Otherwise unremarkable study including no evidence of cirrhosis and normal splenic size measuring 9.6 cm.    12/29/18: Ct shoulder:  1.  Interstitial tearing of the subscapularis, supraspinatus, and   infraspinatus tendons with full-thickness tearing predominantly along the   conjoined fibers of the supraspinatus and infraspinatus tendons which   measures approximately 1.0 x 1.4 cm. ??There is also full-thickness   perforation of the anterior fibers of the supraspinatus tendon. ??Underlying   diffuse mild atrophy of the rotator cuff musculature.   2. Severe advanced osteoarthritis of the glenohumeral joint with underlying   diffuse complex labral tearing.   3. Severe osteoarthritis of the acromioclavicular joint.   4. Synovitis.      Feb 27 2019:Right rotator cuff surgery. Lost lot of blood and received 3 units of PRBCs.     Feb 27 2019:  Unremarkable appearance of reverse glenohumeral arthroplasty.   ??   AC joint osteoarthritis.   ??     01/13/20:Preesented to [facility] with sx of fatigue, Acute bronchitis, reflux sx, hans and fett feeling cold. generalized arthritis pains. No overt bleeding. No bruising. Palpitations. No pain, increased sob. No dizziness.       Past Medical History:     A. fib, osteoarthritis, borderline diabetes.  Seasonal allergies, prostate cancer hypertension.                                                           Past Surgery History:      Basal cell skin cancer removal, cardiac pacemaker, colectomy for diverticular disease, hernia repair, left knee replacement, appendectomy                                                              Social History:   Lives alone.  Has 3 children, they live in [place] in [place].  Retired as Financial trader, [employer] position.  Denies any smoking history or any other illicit drug abuse.                                                                                                    Family History:    Mother was a non-smoker was diagnosed with lung cancer.  Younger brother with colon cancer.  His sister was diagnosed with uterine, ovarian cancer at the age of 83.  Allergies   Allergen Reactions   ??? Cat Hair Extract Other (See Comments)     Runny nose   ??? Dust Mite Extract Other (See Comments)     Runny nose   ??? Other      Other reaction(s): Other (See Comments)  Dust/cats - sneezing   ??? Fish Allergy Nausea And Vomiting     [place] only  Sal[place] only       Current Outpatient Medications on File Prior to Visit   Medication Sig Dispense Refill   ??? ketoconazole (NIZORAL) 2 % shampoo WASH SCALP EVERY OTHER DAY LEAVE ON 3-5 MINUTES BEFORE RINSING     ??? ketoconazole (NIZORAL) 2 % cream APPLY TOPICALLY TO  FACE AND NECK EVERY DAY AS NEEDED WHILE FLARING     ??? triamcinolone (KENALOG) 0.1 % cream      ??? ammonium lactate (AMLACTIN) 12 % cream      ??? cetirizine (ZYRTEC) 10 MG tablet Take 10 mg by mouth daily     ??? apixaban (ELIQUIS) 2.5 MG TABS tablet Take 1 tablet by mouth 2 times daily 180 tablet 3   ??? pantoprazole (PROTONIX) 40 MG tablet Take 1 tablet by mouth 2 times daily (before meals) 60 tablet 2   ??? guaiFENesin (MUCINEX) 600 MG extended release tablet Take 1,200 mg by mouth 2 times daily     ??? alfuzosin (UROXATRAL) 10 MG extended release tablet Take 10 mg by mouth daily     ??? atorvastatin (LIPITOR) 40 MG tablet Take 40 mg by mouth daily     ??? metoprolol succinate (TOPROL XL) 50 MG extended release tablet Take 50 mg by mouth nightly      ??? albuterol sulfate (PROAIR RESPICLICK) 782 (90 Base) MCG/ACT aerosol powder inhalation 2 puffs every 6 hours as needed      ??? montelukast (SINGULAIR) 10 MG tablet 10 mg nightly        No current facility-administered medications on file prior to visit.     Interval history:01/15/20: Arrived alone to the clinic today. Arthritis pain bothering him a lot. Hans and feet are cold. No fever. Nasal discharge. No palpitations. No chest pain, increased sob.  No bleeding or any rash.  No abdominal pain, nausea, emesis, diarrhea or any constipation.     Review of Systems:    As per the interval history, otherwise rest of the ros negative     Vital Signs: BP (!) 83/52 (Site: Right Upper Arm, Position: Sitting, Cuff Size: Medium Adult)    Pulse 109    Temp 97.6 ??F (36.4 ??C) (Temporal)    Ht 5' 7"  (1.702 m)    Wt 177 lb (80.3 kg)    SpO2 99%    BMI 27.72 kg/m??      Physical Exam:  CONSTITUTIONAL: awake, alert, cooperative, very talkative, Uses walker for ambulation.   EYES:PEAR, no palor or any icetrus  UMP:NTIR  NECK: No JVD  HEMATOLOGIC/LYMPHATIC: no cervical, supraclavicular or axillary lymphadenopathy   LUNGS: CTAB  CARDIOVASCULAR: s1s2 irr irr ESM  ABDOMEN: soft ntnd bs pos  NEUROLOGIC:  GI  SKIN: No rash  EXTREMITIES: no LE edema bilaterally.      Labs:  Hematology:  Lab Results   Component Value Date    WBC 12.2 (H) 01/13/2020    RBC 3.30 (L) 01/13/2020    HGB 9.6 (L) 01/13/2020    HCT 29.5 (L) 01/13/2020    MCV 89.4 01/13/2020    MCH 29.1 01/13/2020  MCHC 32.5 01/13/2020    RDW 20.5 (H) 01/13/2020    PLT 63 (L) 01/13/2020    MPV 10.9 01/13/2020    BANDSPCT 4 (L) 02/28/2019    SEGSPCT 87.0 (H) 01/13/2020    EOSRELPCT 0.6 08/16/2019    BASOPCT 0.6 08/16/2019    LYMPHOPCT 8.0 (L) 01/13/2020    MONOPCT 5.0 (H) 01/13/2020    BANDABS 0.50 02/28/2019    SEGSABS 10.6 01/13/2020    EOSABS 0.1 08/16/2019    BASOSABS 0.1 08/16/2019    LYMPHSABS 1.0 01/13/2020    MONOSABS 0.6 01/13/2020    DIFFTYPE MANUAL DIFFERENTIAL 01/13/2020    ANISOCYTOSIS 1+ 01/13/2020    WBCMORP OCCASIONAL 02/28/2019     Lab Results   Component Value Date    ESR 5 02/20/2019     Chemistry:  Lab Results   Component Value Date    NA 130 (L) 01/13/2020    K 4.2 01/13/2020    CL 99 01/13/2020    CO2 20 (L) 01/13/2020    BUN 21 01/13/2020    CREATININE 1.2 01/13/2020    GLUCOSE 157 (H) 01/13/2020    CALCIUM 8.4 01/13/2020    PROT 6.4 01/13/2020    LABALBU 3.9 01/13/2020    BILITOT 1.3 (H) 01/13/2020    ALKPHOS 70 01/13/2020    AST 14 (L) 01/13/2020    ALT 11 01/13/2020    LABGLOM 57 (L) 01/13/2020    GFRAA >60 01/13/2020    MG 1.9 01/13/2020    POCGLU 99 02/27/2019     Lab Results   Component Value Date    LDH 170 05/24/2019     No components found for: LD  Lab Results   Component Value Date    TSHHS 1.760 05/24/2019     Immunology:  Lab Results   Component Value Date    PROT 6.4 01/13/2020    SPEP  03/29/2019     INTERPRETATION - Slightly decreased total proteins.  LP.    ALBUMINELP 3.5 03/29/2019    LABALPH 0.3 03/29/2019    LABALPH 0.6 03/29/2019    LABBETA 0.7 03/29/2019    GAMGLOB 0.9 03/29/2019     No results found for: KAPPAUVOL, LAMBDAUVOL, KLFLCR  No results found for: B2M  Coagulation Panel:  Lab Results   Component Value Date     PROTIME 17.0 (H) 03/03/2019    INR 1.40 03/03/2019    APTT 46.4 (H) 03/03/2019     Anemia Panel:  Lab Results   Component Value Date    VITAMINB12 675.9 08/16/2019    FOLATE 9.9 08/16/2019     Tumor Markers:  No results found for: CA125, CEA, CA199, LABCA2, PSA     Observations:  ECOG:  PHQ-9 Total Score: 0 (01/15/2020 10:27 AM)       Assessment & Plan:    Presyncope?/?feeling unwell: Could be sec to hypotension. Advised him to hold off on the antigypertensives and to follow up with PCP or cardiology ASAP.                                                        Chronic thrombocytopenia: Etiology could be secondary to underlying MDS vs ITP.  Cytogenetics in the past were normal, blast being 2%, his IPSS score was about 1.5 which is a low score.  Platelet  63K. Continue to monitor for now and consider repeat BMB/aspiration for further evaluation and treatment plan if counts declining.    Normocytic anemia: Could be sec to MDS. Other w/u neg basically. Not been on any oral iron or B12 supplements. Consider further evaluation with repeat BMB/aspirate if Hb declining further. Consider ESA if Hb <10     Monoclonal gammopathy noted to have small IgG lambda band on the immunofixation but normal serum protein electrophoresis.  Normal renal function normal calcium levels.  Serum immunoglobulins and serum free light chains were normal.  No evidence of any myeloma defining disease other than anemia.  Will repeat in a year in 2022 feb    Paroxysmal atrial fibrillation.  Has pacemaker.  Is on Eliquis 2.5 mg po bid.   Continue as long as a platelet count of more than 60 K.  Avoid falls, deep cuts and monitor closely for any bleeding.     H/o Colon cancer: s/p resection in 2000, did not receive any adjuvant treatment. Also Family history of colon cancer.  Reported that he is up-to-date with the colonoscopy.    H/O Prostate cancer: Diagnosed in 2010. S/P XRT. Is being followed by Urology.     GERD: Is on PPI, not helping. He is  going to discuss with GI.     Family history ovarian cancer.  No genetic testing has been done and we discussed about that    Continue other medical care.    Discussed above findings and plan with him and he voiced understanding.  Answered all his questions.    Discussed healthy lifestyle including healthy diet, regular exercise as tolerated.  Also discussed importance of being up-to-date with age-appropriate screening tools. Colonoscopy looks like was done in 2019.     Recommend follow-up with primary care physician and other specialists.    Please do not hesitate to contact us if you need further information.    Return to clinic feb 2022 or earlier if new symptoms.    Recived COVID vaccine, booster and also flu vaccine.     [place]

## 2020-01-15 NOTE — Telephone Encounter (Signed)
Patient concerned about afib and metoprolol. OV scheduled

## 2020-01-15 NOTE — Telephone Encounter (Signed)
Patient called stating that he has a funny feeling in his heart, not a pain, and he wants to know if he can switch to another medication.  Please call him back at ph# [phone].

## 2020-01-15 NOTE — Progress Notes (Signed)
MA Rooming Questions  Patient: Dalton Warner  MRN: [id number]    Date: 01/15/2020        1. Do you have any new issues?   no         2. Do you need any refills on medications?    no    3. Have you had any imaging done since your last visit?   yes - 12/05    4. Have you been hospitalized or seen in the emergency room since your last visit here?   yes - 12/05    5. Did the patient have a depression screening completed today? Yes    PHQ-9 Total Score: 0 (01/15/2020 10:27 AM)       PHQ-9 Given to (if applicable):               PHQ-9 Score (if applicable):                     []  Positive     []   Negative              Does question #9 need addressed (if applicable)                     []  Yes    []   No               Luna Glasgow, CMA

## 2020-01-16 ENCOUNTER — Ambulatory Visit: Admit: 2020-01-16 | Discharge: 2020-01-16 | Payer: MEDICARE | Attending: Internal Medicine | Primary: Family

## 2020-01-16 DIAGNOSIS — I4891 Unspecified atrial fibrillation: Secondary | ICD-10-CM

## 2020-01-16 NOTE — Progress Notes (Signed)
CHA2DS2-VASc Score for Atrial Fibrillation Stroke Risk   Risk   Factors  Component Value   C CHF No 0   H HTN Yes 1   A2 Age >= 58 Yes,  (86 y.o.) 2   D DM No 0   S2 Prior Stroke/TIA No 0   V Vascular Disease No 0   A Age 67-74 No,  (86 y.o.) 0   Sc Sex male 0    CHA2DS2-VASc  Score  3   Score last updated 01/16/20 1:93 AM EST    Click here for a link to the UpToDate guideline "Atrial Fibrillation: Anticoagulation therapy to prevent embolization    Disclaimer: Risk Score calculation is dependent on accuracy of patient problem list and past encounter diagnosis.

## 2020-01-16 NOTE — Patient Instructions (Signed)
Please be informed that if you contact our office outside of normal business hours the physician on call cannot help with any scheduling or rescheduling issues, procedure instruction questions or any type of medication issue.    We advise you for any urgent/emergency that you go to the nearest emergency room!    PLEASE CALL OUR OFFICE DURING NORMAL BUSINESS HOURS    Monday - Friday   8 am to 5 pm    [place]: [place]: [phone]    [place]:  [phone]   **It is YOUR responsibilty to bring medication bottles and/or updated medication list to [place]. This will allow [place] to better serve you and all your healthcare needs**

## 2020-01-16 NOTE — Progress Notes (Signed)
Dalton Morin, MD                                  CARDIOLOGY  NOTE         Chief Complaint:    Chief Complaint   Patient presents with   ??? Atrial Fibrillation        Tolerating oral anticoagulation well  No spontaneous bleeding noted  Denies any chest pain shortness of breath  No melena or hematochezia    Echo: 09/19/2019    Left ventricular function is normal, EF is estimated at 55-60%.   Mild left ventricular hypertrophy.   Diastolic dysfunction could not be evaluated due to arrhythmia.   Abnormal (paradoxical) motion consistent with RV pacemaker.   No regional wall motion abnormalities were detected.   Bi atrial enlargement noted.   Sclerotic, but non-stenotic aortic valve.   Mitral annular calcification is present.   Moderate mitral, aortic, tricuspid and mild pulmonic regurgitation is   present.   Mild pulmonary hypertension with an RVSP of 55mmHg.   Dilation of the aortic root(4.0) and the ascending aorta(4.2).   No evidence of pericardial effusion.      Prior HPI:     Dalton Warner is a 86 y.o. year old male who was seen in the hospital for chronic atrial fibrillation.  Patient has history of chronic thrombocytopenia.  He has recently moved into town to take care of his wife.  Patient also has a history of LV thrombus in the past.    Chronic atrial fibrillation  CHA2DS2-VASc score is 6     EKG: Atrial fibrillation at 66 bpm      Current Outpatient Medications   Medication Sig Dispense Refill   ??? ketoconazole (NIZORAL) 2 % shampoo WASH SCALP EVERY OTHER DAY LEAVE ON 3-5 MINUTES BEFORE RINSING     ??? ketoconazole (NIZORAL) 2 % cream APPLY TOPICALLY TO FACE AND NECK EVERY DAY AS NEEDED WHILE FLARING     ??? triamcinolone (KENALOG) 0.1 % cream      ??? ammonium lactate (AMLACTIN) 12 % cream      ??? cetirizine (ZYRTEC) 10 MG tablet Take 10 mg by mouth daily     ??? apixaban (ELIQUIS) 2.5 MG TABS tablet Take 1 tablet by mouth 2 times daily 180 tablet 3   ??? pantoprazole (PROTONIX) 40 MG tablet Take 1 tablet by mouth 2 times  daily (before meals) 60 tablet 2   ??? guaiFENesin (MUCINEX) 600 MG extended release tablet Take 1,200 mg by mouth 2 times daily     ??? alfuzosin (UROXATRAL) 10 MG extended release tablet Take 10 mg by mouth daily     ??? atorvastatin (LIPITOR) 40 MG tablet Take 40 mg by mouth daily     ??? metoprolol succinate (TOPROL XL) 50 MG extended release tablet Take 50 mg by mouth nightly      ??? albuterol sulfate (PROAIR RESPICLICK) 161 (90 Base) MCG/ACT aerosol powder inhalation 2 puffs every 6 hours as needed      ??? montelukast (SINGULAIR) 10 MG tablet 10 mg nightly        No current facility-administered medications for this visit.       Allergies:     Cat hair extract, Dust mite extract, Other, and Fish allergy    Patient History:    Past Medical History:   Diagnosis Date   ??? Arthritis    ??? Atrial fibrillation ([place])    ???  Bleeding ulcer 08/2018   ??? Cancer [facility])     Prostate cancer dx March 2010- tx with radiation    ??? Colon cancer ([place]) 2000    per old chart dx with colon ca - tx with surgery only   ??? Diabetes mellitus ([place])     "was borderline- at one time was on Metformin for several yrs-  when I went to [facility] they had me stop this in 2017"   ??? Fracture     "had broken neck in Nov 2016- wore a collar for 6 weeks only"   ??? HOH (hard of hearing)     bil hearing aide   ??? Hx of blood clots     "had blood clot - it was a  blood clot in  my esophagus "   ??? Hx of Doppler ultrasound 09/19/2019    EF IS 55-60%  Bi atrial enlargement noted.  Moderate mitral, aortic, tricuspid and mild pulmonic regurgitation is present   ??? Hx of fall     "last fall 2016- use cane walker and have electric chair"   ??? Hyperlipidemia    ??? Hypertension     follow with Dr Freeman Caldron at [facility]   ??? Limited range of motion (ROM) of shoulder     "right shoulder haved 10 % usage and left shoulder 60 % of usage"   ??? Neuropathy     both feet   ??? Sleep apnea     "had sleep study- tried to give me cpap but could not tolerate it "   ??? TIA (transient ischemic  attack)     'in Jan 2009- trouble with speech, numbness of lip, weakness right arm - all only lasted for 20 seconds"   ??? Unsteady gait     "hx of back problem- was on steroids for 4 yrs- since then stability issues with my back -    ??? Wears glasses     to read      Past Surgical History:   Procedure Laterality Date   ??? ABDOMEN SURGERY  2000    per old chart had right hemicolectomy "removed 2 and a half feet of colon and my appendix"   ??? COLONOSCOPY  2019   ??? ENDOSCOPY, COLON, DIAGNOSTIC      per old chart had EGD done 08/2018 and 09/2018   ??? HERNIA REPAIR      per old chart open left ing hernia 1980 and then recurrent left ing hernia repair 08/2018   ??? JOINT REPLACEMENT      per old chart total left knee 2018   ??? PACEMAKER PLACEMENT      per old chart hx of [facility] jude pacer insertion 2008- new pacer inserted 2019   ??? SHOULDER SURGERY Left 2015    rota. cuff   ??? SHOULDER SURGERY Right 02/27/2019    RIGHT REVERSE SHOULDER TOTAL ARTHROPLASTY REVERSE performed by Avel Sensor, MD at [facility]     Family History   Problem Relation Age of Onset   ??? Cancer Mother         lung cancer   ??? Diabetes Mother    ??? Diabetes Sister    ??? Cancer Brother    ??? High Blood Pressure Brother      Social History     Tobacco Use   ??? Smoking status: Never Smoker   ??? Smokeless tobacco: Never Used   Substance Use Topics   ??? Alcohol use: Yes  Comment: "maybe 6 times per year" "when had prostate cancer did drink one glass red wine daily in the past        Review of Systems:     ?? Constitutional:  No Fever or Weight Loss   ?? Eyes: No Decreased Vision  ?? ENT: No Headaches, Hearing Loss or Vertigo  ?? Cardiovascular: No Chest Pain,  No Shortness of breath, No Palpitations. No Edema   ?? Respiratory: No cough or wheezing . No Respiratory distress   ?? Gastrointestinal: No abdominal pain, appetite loss, blood in stools, constipation, diarrhea or heartburn  ?? Genitourinary: No dysuria, trouble voiding, or hematuria  ?? Musculoskeletal:  denies any new   joint aches , or pain   ?? Integumentary: No rash or pruritis  ?? Neurological: No TIA or stroke symptoms  ?? Psychiatric: No anxiety or depression  ?? Endocrine: No malaise, fatigue or temperature intolerance  ?? Hematologic/Lymphatic: No bleeding problems, blood clots or swollen lymph nodes  ?? Allergic/Immunologic: No nasal congestion or hives        Objective:      Physical Exam:    BP 122/78    Pulse 104    Ht 5\' 7"  (1.702 m)    Wt 177 lb (80.3 kg)    BMI 27.72 kg/m??   Wt Readings from Last 3 Encounters:   01/16/20 177 lb (80.3 kg)   01/15/20 177 lb (80.3 kg)   09/13/19 176 lb (79.8 kg)     Body mass index is 27.72 kg/m??.  Vitals:    01/16/20 0932   BP: 122/78   Pulse: 104        General Appearance and Constitutional: Conversant, Well developed, Well nourished, No acute distress, Non-toxic appearance.   HEENT:  Normocephalic, Atraumatic, Bilateral external ears normal, Oropharynx moist, No oral exudates,   Nose normal.   Neck- Normal range of motion, No tenderness, Supple  Eyes:  EOMI, Conjunctiva normal, No discharge.   Respiratory:  Normal breath sounds, No respiratory distress, No wheezing, No Rales, No Ronchi.  No chest tenderness.   Cardiovascular: S1-S2 IRIR, no added heart sounds, No Mumurs appreciated. No gallops, rubs. No Pedal Edema   GI:  Bowel sounds normal, Soft, No tenderness,  GU:  No costovertebral angle tenderness   Musculoskeletal:  No gross deformities. Back- No tenderness  Integument:  Well hydrated, no rash   Lymphatic:  No lymphadenopathy noted   Neurologic:  Alert & oriented x 3, Normal motor function, normal sensory function, no focal deficits noted   Psychiatric:  Speech and behavior appropriate       Medical decision making and Data review:    DATA:    Lab Results   Component Value Date    TROPONINT <0.010 01/13/2020     BNP:    Lab Results   Component Value Date    PROBNP 2,032 (H) 01/13/2020     PT/INR:  No results found for: PTINR  No results found for: LABA1C  Lab Results   Component  Value Date    CHOL 80 10/18/2019    TRIG 62 10/18/2019    HDL 31 (A) 10/18/2019    LDLCALC 35 10/18/2019     Lab Results   Component Value Date    WBC 11.0 (H) 01/15/2020    HGB 9.0 (L) 01/15/2020    HCT 27.2 (L) 01/15/2020    MCV 87.2 01/15/2020    PLT 54 (L) 01/15/2020     TSH: No results found for:  TSH  Lab Results   Component Value Date    AST 15 01/15/2020    ALT 12 01/15/2020    BILITOT 1.7 (H) 01/15/2020    ALKPHOS 73 01/15/2020         All labs, medications and tests reviewed by myself including data and history from outside source , patient and available family .      1. Atrial fibrillation, unspecified type ([place])    2. Thrombocytopenia ([place])    3. Anemia, unspecified type    4. Essential hypertension    5. Dyslipidemia         Impression and Plan:    1. Chronic atrial fibrillation,??with history of TIA as well as hx of LV thrombus. ??CHA2DS2-VASc score is > 6. on low-dose Eliquis understanding high risk of ischemic stroke versus bleeding risk. Cont BB  - No LV thrombus noted on last Echo     2. Anemia as well as thrombocytopenia ( Follows with Dr. Franchot Mimes)  ??Patient evaluated by GI in hospital ??Anemia is presumed to be surgical blood loss. Last Hb 9.0 Pt has ch. Thrombocytopenia ( 54) and can be observed on [place].     3. Hyperlipidemia: Cont lipitor 40 mg daily      4. HTN: Cont Metoprolol. BP well controlled. Pt had episodes of hypotension, but is normotensive with meds for the most part, also rate controlled     5. SSS s.p PPM . Follow up with pacer clinic as outpt??- Pacemaker report reviewed 09/10/2019      Return in about 6 months (around 07/16/2020).          Counseled extensively and medication compliance urged.  We discussed that for the  prevention of ASCVD our  goal is aggressive risk modification.Patient is encouraged to exercise even a brisk walk for 30 minutes  at least 3 to 4 times a week   Various goals were discussed and questions answered. Continue current medications. Appropriate prescriptions are  addressed and refills ordered.  Questions answered and patient verbalizes understanding.  Call for any problems, questions, or concerns.

## 2020-02-19 ENCOUNTER — Encounter: Primary: Family

## 2020-02-26 ENCOUNTER — Encounter: Primary: Family

## 2020-02-26 ENCOUNTER — Encounter: Attending: Internal Medicine | Primary: Family

## 2020-03-05 MED ORDER — APIXABAN 2.5 MG PO TABS
2.5 MG | ORAL_TABLET | Freq: Two times a day (BID) | ORAL | 3 refills | Status: DC
Start: 2020-03-05 — End: 2021-02-05

## 2020-03-17 ENCOUNTER — Ambulatory Visit: Payer: MEDICARE | Attending: Cardiovascular Disease | Primary: Family

## 2020-03-17 DIAGNOSIS — Z950 Presence of cardiac pacemaker: Secondary | ICD-10-CM

## 2020-03-20 ENCOUNTER — Encounter: Attending: Internal Medicine | Primary: Family

## 2020-04-01 ENCOUNTER — Inpatient Hospital Stay: Admit: 2020-04-01 | Discharge: 2020-04-01 | Payer: MEDICARE | Primary: Family

## 2020-04-01 DIAGNOSIS — D472 Monoclonal gammopathy: Secondary | ICD-10-CM

## 2020-04-01 LAB — COMPREHENSIVE METABOLIC PANEL
ALT: 18 U/L (ref 10–40)
AST: 22 IU/L (ref 15–37)
Albumin: 4 GM/DL (ref 3.4–5.0)
Alkaline Phosphatase: 90 IU/L (ref 40–129)
Anion Gap: 10 (ref 4–16)
BUN: 20 MG/DL (ref 6–23)
CO2: 25 MMOL/L (ref 21–32)
Calcium: 9 MG/DL (ref 8.3–10.6)
Chloride: 102 mMol/L (ref 99–110)
Creatinine: 0.9 MG/DL (ref 0.9–1.3)
GFR African American: >60 mL/min/{1.73_m2} (ref >60)
GFR Non-African American: >60 mL/min/{1.73_m2} (ref >60)
Glucose: 117 MG/DL — ABNORMAL HIGH (ref 70–99)
Potassium: 4.5 MMOL/L (ref 3.5–5.1)
Sodium: 137 MMOL/L (ref 135–145)
Total Bilirubin: 0.8 MG/DL (ref 0.0–1.0)
Total Protein: 6.1 GM/DL — ABNORMAL LOW (ref 6.4–8.2)

## 2020-04-01 LAB — CBC WITH AUTO DIFFERENTIAL
Basophils %: 0.7 % (ref 0–1)
Basophils Absolute: 0.1 10*3/uL
Eosinophils %: 1.3 % (ref 0–3)
Eosinophils Absolute: 0.1 10*3/uL
Hematocrit: 34.1 % — ABNORMAL LOW (ref 42–52)
Hemoglobin: 11.2 GM/DL — ABNORMAL LOW (ref 13.5–18.0)
Lymphocytes %: 26.4 % (ref 24–44)
Lymphocytes Absolute: 1.9 10*3/uL
MCH: 28.7 PG (ref 27–31)
MCHC: 32.8 % (ref 32.0–36.0)
MCV: 87.4 FL (ref 78–100)
MPV: 10 FL (ref 7.5–11.1)
Monocytes %: 12.8 % — ABNORMAL HIGH (ref 0–4)
Monocytes Absolute: 0.9 10*3/uL
Platelets: 64 10*3/uL — ABNORMAL LOW (ref 140–440)
RBC: 3.9 10*6/uL — ABNORMAL LOW (ref 4.6–6.2)
RDW: 22.1 % — ABNORMAL HIGH (ref 11.7–14.9)
Segs Absolute: 4.2 10*3/uL
Segs Relative: 58.8 % (ref 36–66)
WBC: 7.2 10*3/uL (ref 4.0–10.5)

## 2020-04-01 LAB — IRON AND TIBC
Iron: 116 ug/dL (ref 59–158)
TIBC: 280 ug/dL (ref 250–450)
Transferrin %: 41 % (ref 10–44)
UIBC: 164 ug/dL (ref 110–370)

## 2020-04-01 LAB — VITAMIN B12 & FOLATE
Folate: 13.7 NG/ML (ref 3.1–17.5)
Vitamin B-12: 432.7 pg/ml (ref 211–911)

## 2020-04-01 LAB — FERRITIN: Ferritin: 99 NG/ML (ref 30–400)

## 2020-04-02 LAB — ELECTROPHORESIS PROTEIN, SERUM
Albumin Electrophoresis: 3.6 GM/DL (ref 3.2–5.6)
Alpha-1-Globulin: 0.2 GM/DL (ref 0.1–0.4)
Alpha-2-Globulin: 0.6 GM/DL (ref 0.4–1.2)
Beta Globulin: 0.7 GM/DL (ref 0.5–1.3)
Gamma Globulin: 0.9 GM/DL (ref 0.5–1.6)
Total Protein: 6.1 GM/DL — ABNORMAL LOW (ref 6.4–8.2)

## 2020-04-02 LAB — IMMUNOFIXATION SERUM PROFILE

## 2020-04-08 ENCOUNTER — Ambulatory Visit: Admit: 2020-04-08 | Discharge: 2020-04-08 | Payer: MEDICARE | Attending: Internal Medicine | Primary: Family

## 2020-04-08 ENCOUNTER — Inpatient Hospital Stay: Admit: 2020-04-08 | Discharge: 2020-04-08 | Payer: MEDICARE | Primary: Family

## 2020-04-08 DIAGNOSIS — D472 Monoclonal gammopathy: Secondary | ICD-10-CM

## 2020-04-08 MED ORDER — FERROUS GLUCONATE 324 (37.5 FE) MG PO TABS
324 | ORAL_TABLET | Freq: Two times a day (BID) | ORAL | 3 refills | Status: DC
Start: 2020-04-08 — End: 2020-04-15

## 2020-04-08 NOTE — Progress Notes (Signed)
MA Rooming Questions  Patient: Dalton Warner  MRN: [id number]    Date: 04/08/2020        1. Do you have any new issues?   no         2. Do you need any refills on medications?    no    3. Have you had any imaging done since your last visit?   no    4. Have you been hospitalized or seen in the emergency room since your last visit here?   no    5. Did the patient have a depression screening completed today? Yes    PHQ-9 Total Score: 0 (04/08/2020  8:51 AM)       PHQ-9 Given to (if applicable):               PHQ-9 Score (if applicable):                     []  Positive     []   Negative              Does question #9 need addressed (if applicable)                     []  Yes    []   No               Waynard Reeds, CMA

## 2020-04-08 NOTE — Progress Notes (Signed)
Patient Name:  Dalton Warner  Patient DOB:  1930-08-06  Patient MRN:  [id number]     Primary Oncologist: Charisse Klinefelter, MD  Referring Provider: Carloyn Manner, APRN - CNP     Date of Service: 04/08/2020      Reason for Consult:  Thrombocytopenia     Chief Complaint:    Chief Complaint   Patient presents with   ??? Discuss Labs       Encounter Diagnoses   Name Primary?   ??? Monoclonal gammopathy Yes   ??? Anemia, unspecified type    ??? Thrombocytopenia ([place])         HPI:   11/22/18: Arrived alone to the clinic today. Loves to talk.  According to Dr. Hortense Ramal his oncologist at [facility] he was diagnosed with prostate cancer in 2010.  Received external beam radiation therapy.  He has been on Xarelto since 2017 for A. fib.  Prior to which he was on Coumadin since 2008.  Further work-up was negative for hepatitis B, hepatitis C and HIV.  No evidence of hemolysis.  B12 was on lower side.  Folate was normal.  Iron studies were normal.  Serum protein electrophoresis was normal.  But imaging of fixation revealed a small IgG lambda monoclonal band.  Was advised to be on B12 supplements.  Today at the office he reported that he had hernia surgery on July 24 when he received platelet transfusion.  Reported that his been caregiver to his wife for 20 years and is currently at [facility] home at the other mass unit.  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  Married for 67 years.  Lost a lot of weight in the process of taking care of his wife.  Reported tiredness.  No bleeding or any rash.  No chest pain.  Reported arthritis pain all the time.  Reported that he is taking oral B12 supplements thousand micrograms daily.    11/02/2016: [id number] review demonstrates a bone marrow that is approximately 30% cellular with trilineage hematopoiesis and 1-2% blasts based on the manual differential count, as confirmed by immunophenotypic studies. There are mild myeloid dyspoietic changes as well  as minimal erythroid atypia. Although reactive etiologies including medications, toxins and nutritional deficiencies must be excluded, the patient's cytopenias and bone marrow morphologic findings raise the possibility of an underlying myelodysplastic syndrome.  FISH/Cytogenetics: Normal    07/20/2018 CBC with WBC of 6.52 hemoglobin of 11.0 hematocrit 34.7 MCV of 93.3 platelets of 78 diff within normal limits    March 2020  platelet count was 81.    Jan 2020  it was 85K    09/2018: CT chest:1. Ectasia of the ascending thoracic aorta measuring 4.2 cm in short-axis diameter. ??This may be due to aortic stenosis considering the aortic valve calcifications present.  2. No evidence of aortic dissection.  3. Atherosclerotic calcification of the 3 major native coronary arteries.  4. Moderate dilation of cardiac chambers.  5. Evidence of prior granulomatous disease with no definite pneumonia or suspicious-appearing pulmonary nodules.  6. Trace right pleural effusion.  7. Mild fatty infiltration of the liver.  8. Small sliding-type hiatal hernia.    09/08/18: Ultrasound:1. Mild ascites evident in the left upper quadrant.  2. Nonvisualized pancreas and majority of the abdominal aorta due to overlying bowel gas.  3. Otherwise unremarkable study including no evidence of cirrhosis and normal splenic size measuring 9.6 cm.    12/29/18: Ct shoulder:  1. Interstitial tearing of the subscapularis, supraspinatus, and   infraspinatus tendons with full-thickness tearing predominantly along the   conjoined fibers of the supraspinatus and infraspinatus tendons which   measures approximately 1.0 x 1.4 cm. ??There is also full-thickness   perforation of the anterior fibers of the supraspinatus tendon. ??Underlying   diffuse mild atrophy of the rotator cuff musculature.   2. Severe advanced osteoarthritis of the glenohumeral joint with underlying   diffuse complex labral tearing.   3. Severe osteoarthritis of the acromioclavicular joint.   4.  Synovitis.     Feb 27 2019:Right rotator cuff surgery. Lost lot of blood and received 3 units of PRBCs.     Feb 27 2019:  Unremarkable appearance of reverse glenohumeral arthroplasty.   ??   AC joint osteoarthritis.   ??     01/13/20:Preesented to [facility] with sx of fatigue, Acute bronchitis, reflux sx, hans and fett feeling cold. generalized arthritis pains. No overt bleeding. No bruising. Palpitations. No pain, increased sob. No dizziness.       Past Medical History:     A. fib, osteoarthritis, borderline diabetes.  Seasonal allergies, prostate cancer hypertension.                                                           Past Surgery History:      Basal cell skin cancer removal, cardiac pacemaker, colectomy for diverticular disease, hernia repair, left knee replacement, appendectomy                                                              Social History:   Lives alone.  Has 3 children, they live in [place] in [place].  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  Denies any smoking history or any other illicit drug abuse.                                                                                                    Family History:    Mother was a non-smoker was diagnosed with lung cancer.  Younger brother with colon cancer.  His sister was diagnosed with uterine, ovarian cancer at the age of 62.  Allergies   Allergen Reactions   ??? Cat Hair Extract Other (See Comments)     Runny nose   ??? Dust Mite Extract Other (See Comments)     Runny nose   ??? Other      Other reaction(s): Other (See Comments)  Dust/cats - sneezing   ??? Fish Allergy Nausea And Vomiting     [place] only  [place] only       Current Outpatient Medications on File Prior to Visit   Medication Sig Dispense Refill   ??? apixaban (ELIQUIS) 2.5 MG TABS tablet Take 1 tablet by mouth 2 times daily 180 tablet 3   ??? ketoconazole (NIZORAL) 2 % shampoo WASH SCALP EVERY  OTHER DAY LEAVE ON 3-5 MINUTES BEFORE RINSING     ??? ketoconazole (NIZORAL) 2 % cream APPLY TOPICALLY TO FACE AND NECK EVERY DAY AS NEEDED WHILE FLARING     ??? triamcinolone (KENALOG) 0.1 % cream      ??? ammonium lactate (AMLACTIN) 12 % cream      ??? cetirizine (ZYRTEC) 10 MG tablet Take 10 mg by mouth daily     ??? pantoprazole (PROTONIX) 40 MG tablet Take 1 tablet by mouth 2 times daily (before meals) 60 tablet 2   ??? guaiFENesin (MUCINEX) 600 MG extended release tablet Take 1,200 mg by mouth 2 times daily     ??? alfuzosin (UROXATRAL) 10 MG extended release tablet Take 10 mg by mouth daily     ??? atorvastatin (LIPITOR) 40 MG tablet Take 40 mg by mouth daily     ??? metoprolol succinate (TOPROL XL) 50 MG extended release tablet Take 50 mg by mouth nightly      ??? albuterol sulfate (PROAIR RESPICLICK) 098 (90 Base) MCG/ACT aerosol powder inhalation 2 puffs every 6 hours as needed      ??? montelukast (SINGULAIR) 10 MG tablet 10 mg nightly        No current facility-administered medications on file prior to visit.     Interval history:04/08/20: Arrived alone to the clinic today. No chest pain, increased sob.  No bleeding or any rash.  No abdominal pain, nausea, emesis, diarrhea or any constipation. Reported fatigue. Appetite is better and gained weight.     Review of Systems:    As per the interval history, otherwise rest of the ros negative     Vital Signs: BP 108/60 (Site: Right Upper Arm, Position: Sitting, Cuff Size: Medium Adult)    Pulse 69    Temp 97 ??F (36.1 ??C) (Infrared)    Resp 16    Ht 5' 7"  (1.702 m)    Wt 179 lb (81.2 kg)    SpO2 97%    BMI 28.04 kg/m??      Physical Exam:  CONSTITUTIONAL: awake, alert,Uses walker for ambulation.   EYES:PEAR, no palor or any icetrus  JXB:JYNW  NECK: No JVD  HEMATOLOGIC/LYMPHATIC: no cervical, supraclavicular or axillary lymphadenopathy   LUNGS: CTAB  CARDIOVASCULAR: s1s2 irr irr ESM  ABDOMEN: soft ntnd bs pos  NEUROLOGIC: GI  SKIN: No rash  EXTREMITIES: no LE edema bilaterally.       Labs:  Hematology:  Lab Results   Component Value Date    WBC 7.2 04/01/2020    RBC 3.90 (L) 04/01/2020    HGB 11.2 (L) 04/01/2020    HCT 34.1 (L) 04/01/2020    MCV 87.4 04/01/2020    MCH 28.7 04/01/2020    MCHC 32.8 04/01/2020    RDW 22.1 (H) 04/01/2020  PLT 64 (L) 04/01/2020    MPV 10.0 04/01/2020    BANDSPCT 4 (L) 02/28/2019    SEGSPCT 58.8 04/01/2020    EOSRELPCT 1.3 04/01/2020    BASOPCT 0.7 04/01/2020    LYMPHOPCT 26.4 04/01/2020    MONOPCT 12.8 (H) 04/01/2020    BANDABS 0.50 02/28/2019    SEGSABS 4.2 04/01/2020    EOSABS 0.1 04/01/2020    BASOSABS 0.1 04/01/2020    LYMPHSABS 1.9 04/01/2020    MONOSABS 0.9 04/01/2020    DIFFTYPE AUTOMATED DIFFERENTIAL 04/01/2020    ANISOCYTOSIS 1+ 01/13/2020    WBCMORP OCCASIONAL 02/28/2019     Lab Results   Component Value Date    ESR 5 02/20/2019     Chemistry:  Lab Results   Component Value Date    NA 137 04/01/2020    K 4.5 04/01/2020    CL 102 04/01/2020    CO2 25 04/01/2020    BUN 20 04/01/2020    CREATININE 0.9 04/01/2020    GLUCOSE 117 (H) 04/01/2020    CALCIUM 9.0 04/01/2020    PROT 6.1 (L) 04/01/2020    PROT 6.1 (L) 04/01/2020    LABALBU 4.0 04/01/2020    BILITOT 0.8 04/01/2020    ALKPHOS 90 04/01/2020    AST 22 04/01/2020    ALT 18 04/01/2020    LABGLOM >60 04/01/2020    GFRAA >60 04/01/2020    MG 1.9 01/13/2020    POCGLU 99 02/27/2019     Lab Results   Component Value Date    LDH 170 05/24/2019     No components found for: LD  Lab Results   Component Value Date    TSHHS 1.760 05/24/2019     Immunology:  Lab Results   Component Value Date    PROT 6.1 (L) 04/01/2020    PROT 6.1 (L) 04/01/2020    SPEP  04/01/2020     INTERPRETATION - A faint free lambda light chains monoclonal band is detected. SAF    SPEP  04/01/2020     INTERPRETATION - Concurrent immunofixation detected a faint free lambda light chains monoclonal band, which is too small to measure. SAF    ALBUMINELP 3.6 04/01/2020    LABALPH 0.2 04/01/2020    LABALPH 0.6 04/01/2020    LABBETA 0.7 04/01/2020     GAMGLOB 0.9 04/01/2020     No results found for: KAPPAUVOL, LAMBDAUVOL, KLFLCR  No results found for: B2M  Coagulation Panel:  Lab Results   Component Value Date    PROTIME 17.0 (H) 03/03/2019    INR 1.40 03/03/2019    APTT 46.4 (H) 03/03/2019     Anemia Panel:  Lab Results   Component Value Date    VITAMINB12 432.7 04/01/2020    FOLATE 13.7 04/01/2020     Tumor Markers:  No results found for: CA125, CEA, CA199, LABCA2, PSA     Observations:  ECOG:  PHQ-9 Total Score: 0 (04/08/2020  8:51 AM)       Assessment & Plan:                                                          Chronic thrombocytopenia: Etiology could be secondary to underlying MDS vs ITP.  Cytogenetics in the past were normal, blast being 2%, his IPSS score was about 1.5  which is a low score.  Platelet 64K in feb 2022. Continue to monitor for now and consider repeat BMB/aspiration for further evaluation and treatment plan if counts declining.    Normocytic anemia: Could be sec to MDS vs mild iron def.  Consider further evaluation with repeat BMB/aspirate if Hb declining further. Recommend oral iron/vitamin C and also b12 supplements.     Monoclonal gammopathy noted to have small IgG lambda band on the immunofixation but normal serum protein electrophoresis.  Normal renal function normal calcium levels.  Serum immunoglobulins and serum free light chains were normal.  No evidence of any myeloma defining disease other than anemia.  Repeat panel in feb 2022 with very faint M protein to small to measure. Will monitor yearly    Paroxysmal atrial fibrillation.  Has pacemaker.  Is on Eliquis 2.5 mg po bid.   Continue as long as a platelet count of more than 60 K.  Avoid falls, deep cuts and monitor closely for any bleeding.     H/o Colon cancer: s/p resection in 2000, did not receive any adjuvant treatment. Also Family history of colon cancer.  Reported that he is up-to-date with the colonoscopy.    H/O Prostate cancer: Diagnosed in 2010. S/P XRT. Is being  followed by Urology.     GERD: Is on PPI, not helping. He is going to discuss with GI.     Family history ovarian cancer.  No genetic testing has been done and we discussed about that. He defers genetic testing at this point    Continue other medical care.    Discussed above findings and plan with him and he voiced understanding.  Answered all his questions.    Discussed healthy lifestyle including healthy diet, regular exercise as tolerated.  Also discussed importance of being up-to-date with age-appropriate screening tools. Colonoscopy looks like was done in 2019, but he said he would follow    Recommend follow-up with primary care physician and other specialists.    Please do not hesitate to contact us if you need further information.    Return to clinic June 2022  or earlier if new symptoms.    Recived COVID vaccine, booster and also flu vaccine.     [place]

## 2020-04-14 ENCOUNTER — Ambulatory Visit: Admit: 2020-04-14 | Discharge: 2020-04-14 | Payer: MEDICARE | Attending: Pulmonary Disease | Primary: Family

## 2020-04-14 ENCOUNTER — Encounter

## 2020-04-14 DIAGNOSIS — R0602 Shortness of breath: Secondary | ICD-10-CM

## 2020-04-14 NOTE — Progress Notes (Signed)
Subjective:   CHIEF COMPLAINT / HPI:  87 year old male with sob with exertion. He denies any wheeze. He has off and on cough.he denies any fever or chest pain or hemoptysis. He has allergy issues for which he see allergist      Past Medical History:  Past Medical History:   Diagnosis Date   ??? Arthritis    ??? Atrial fibrillation ([place])    ??? Bleeding ulcer 08/2018   ??? Cancer [facility])     Prostate cancer dx March 2010- tx with radiation    ??? Colon cancer ([place]) 2000    per old chart dx with colon ca - tx with surgery only   ??? Diabetes mellitus ([employer] [facility])     "was borderline- at one time was on Metformin for several yrs-  when I went to [facility] they had me stop this in 2017"   ??? Fracture     "had broken neck in Nov 2016- wore a collar for 6 weeks only"   ??? HOH (hard of hearing)     bil hearing aide   ??? Hx of blood clots     "had blood clot - it was a  blood clot in  my esophagus "   ??? Hx of Doppler ultrasound 09/19/2019    EF IS 55-60%  Bi atrial enlargement noted.  Moderate mitral, aortic, tricuspid and mild pulmonic regurgitation is present   ??? Hx of fall     "last fall 2016- use cane walker and have electric chair"   ??? Hyperlipidemia    ??? Hypertension     follow with Dr Freeman Caldron at [facility]   ??? Limited range of motion (ROM) of shoulder     "right shoulder haved 10 % usage and left shoulder 60 % of usage"   ??? Neuropathy     both feet   ??? Sleep apnea     "had sleep study- tried to give me cpap but could not tolerate it "   ??? TIA (transient ischemic attack)     'in Jan 2009- trouble with speech, numbness of lip, weakness right arm - all only lasted for 20 seconds"   ??? Unsteady gait     "hx of back problem- was on steroids for 4 yrs- since then stability issues with my back -    ??? Wears glasses     to read        Past Surgical History:        Procedure Laterality Date   ??? ABDOMEN SURGERY  2000    per old chart had right hemicolectomy "removed 2 and a half feet of colon and my appendix"   ??? COLONOSCOPY  2019   ???  ENDOSCOPY, COLON, DIAGNOSTIC      per old chart had EGD done 08/2018 and 09/2018   ??? HERNIA REPAIR      per old chart open left ing hernia 1980 and then recurrent left ing hernia repair 08/2018   ??? JOINT REPLACEMENT      per old chart total left knee 2018   ??? PACEMAKER PLACEMENT      per old chart hx of st jude pacer insertion 2008- new pacer inserted 2019   ??? SHOULDER SURGERY Left 2015    rota. cuff   ??? SHOULDER SURGERY Right 02/27/2019    RIGHT REVERSE SHOULDER TOTAL ARTHROPLASTY REVERSE performed by Avel Sensor, MD at [facility] OR       Current Medications:  No current facility-administered medications for this visit.    Allergies   Allergen Reactions   ??? Cat Hair Extract Other (See Comments)     Runny nose   ??? Dust Mite Extract Other (See Comments)     Runny nose   ??? Other      Other reaction(s): Other (See Comments)  Dust/cats - sneezing   ??? Fish Allergy Nausea And Vomiting     Salmon only  Salmon only       Social History:    Social History     Socioeconomic History   ??? Marital status: Married     Spouse name: None   ??? Number of children: None   ??? Years of education: None   ??? Highest education level: None   Occupational History   ??? None   Tobacco Use   ??? Smoking status: Never Smoker   ??? Smokeless tobacco: Never Used   Vaping Use   ??? Vaping Use: Never used   Substance and Sexual Activity   ??? Alcohol use: Yes     Comment: "maybe 6 times per year" "when had prostate cancer did drink one glass red wine daily in the past   ??? Drug use: Never   ??? Sexual activity: None   Other Topics Concern   ??? None   Social History Narrative   ??? None     Social Determinants of Health     Financial Resource Strain:    ??? Difficulty of Paying Living Expenses: Not on file   Food Insecurity:    ??? Worried About Charity fundraiser in the Last Year: Not on file   ??? Ran Out of Food in the Last Year: Not on file   Transportation Needs:    ??? Lack of Transportation (Medical): Not on file   ??? Lack of Transportation (Non-Medical): Not on file    Physical Activity:    ??? Days of Exercise per Week: Not on file   ??? Minutes of Exercise per Session: Not on file   Stress:    ??? Feeling of Stress : Not on file   Social Connections:    ??? Frequency of Communication with Friends and Family: Not on file   ??? Frequency of Social Gatherings with Friends and Family: Not on file   ??? Attends Religious Services: Not on file   ??? Active Member of Clubs or Organizations: Not on file   ??? Attends Archivist Meetings: Not on file   ??? Marital Status: Not on file   Intimate Partner Violence:    ??? Fear of Current or Ex-Partner: Not on file   ??? Emotionally Abused: Not on file   ??? Physically Abused: Not on file   ??? Sexually Abused: Not on file   Housing Stability:    ??? Unable to Pay for Housing in the Last Year: Not on file   ??? Number of Places Lived in the Last Year: Not on file   ??? Unstable Housing in the Last Year: Not on file       Family History:   Family History   Problem Relation Age of Onset   ??? Cancer Mother         lung cancer   ??? Diabetes Mother    ??? Diabetes Sister    ??? Cancer Brother    ??? High Blood Pressure Brother        Immunization:    There is no immunization history on  file for this patient.      REVIEW OF SYSTEMS:    CONSTITUTIONAL:  negative for fevers, chills, diaphoresis, activity change, appetite change, fatigue, night sweats and unexpected weight change.   EYES:  negative for blurred vision, eye discharge, visual disturbance and icterus  HEENT:  negative for hearing loss, tinnitus, ear drainage, sinus pressure, nasal congestion, epistaxis and snoring  RESPIRATORY:  See HPI  CARDIOVASCULAR:  negative for chest pain, palpitations, exertional chest pressure/discomfort, edema, syncope  GASTROINTESTINAL:  negative for nausea, vomiting, diarrhea, constipation, blood in stool and abdominal pain  GENITOURINARY:  negative for frequency, dysuria and hematuria  HEMATOLOGIC/LYMPHATIC:  negative for easy bruising, bleeding and  lymphadenopathy  ALLERGIC/IMMUNOLOGIC:  negative for recurrent infections, angioedema, anaphylaxis and drug reactions  ENDOCRINE:  negative for weight changes and diabetic symptoms including polyuria, polydipsia and polyphagia    MUSCULOSKELETAL:  negative for  pain, joint swelling, decreased range of motion and muscle weakness  NEUROLOGICAL:  negative for headaches, slurred speech, unilateral weakness  PSYCHIATRIC/BEHAVIORAL: negative for hallucinations, behavioral problems, confusion and agitation.     Objective:   PHYSICAL EXAM:      VITALS:    Vitals:    04/14/20 1430   Pulse: 85   Resp: 18   SpO2: 97%   Weight: 168 lb (76.2 kg)   Height: 5\' 7"  (1.702 m)         CONSTITUTIONAL:  awake, alert, cooperative, no apparent distress, and appears stated age  NECK:  Supple, symmetrical, trachea midline, no adenopathy, thyroid symmetric, not enlarged and no tenderness  CHEST: Chest expansion equal and symmetrical, no intercostal retraction.  LUNGS:  no increased work of breathing, has expiratory wheezes both lungs, no crackles.  CARDIOVASCULAR: S1 and S2, no edema and no JVD  ABDOMEN:  normal bowel sounds, non-distended and no masses palpated, and no tenderness to palpation. No hepatospleenomegaly  LYMPHADENOPATHY:  no axillary or supraclavicular adenopathy. No cervical adnenopathy  PSYCHIATRIC: Oriented to person place and time. No obvious depression or anxiety.  MUSCULOSKELETAL: No obvious misalignment or effusion of the joints. No clubbing, cyanosis of the digits.  RIGHT AND LEFT LOWER EXTREMITIES: No edema, no inflammation, no tenderness.  SKIN:  normal skin color, texture, turgor and no redness, warmth, or swelling. No palpable nodules    DATA:                    cxr is normal    Assessment:     1. Sob,r/o asthma  2. ch atrial fibrillation          Plan:     1. D/w pt  2. cpm  3. His cxr is normal  4. Schedule pft  5. Further recommendations afterwards  6. rtc 4 weeks

## 2020-04-15 MED ORDER — FERROUS GLUCONATE 324 (37.5 FE) MG PO TABS
324 (37.5 Fe) MG | ORAL_TABLET | Freq: Two times a day (BID) | ORAL | 3 refills | Status: AC
Start: 2020-04-15 — End: 2021-03-18

## 2020-04-15 NOTE — Telephone Encounter (Signed)
 Medication resent to [facility]

## 2020-04-15 NOTE — Telephone Encounter (Signed)
-----   Message from [place], Se[place] sent at 04/15/2020 10:29 AM EST -----  Can you please resend the ferrous gluconate  to [facility], it has to be filled at a local pharmacy and patient has to pay out of pocket, patient it aware.    Please and thank you,  Peg     ----- Message -----  From: Philis Payor, MA  Sent: 04/14/2020   4:04 PM EST  To: Dagoberto Bullion, MA, Winton Collet, MA    Pt called in and states he needs a PA on his Iron tabs.    Thank you!

## 2020-05-08 ENCOUNTER — Ambulatory Visit: Payer: MEDICARE | Primary: Family

## 2020-05-14 ENCOUNTER — Inpatient Hospital Stay: Admit: 2020-05-14 | Payer: MEDICARE | Primary: Family

## 2020-05-14 DIAGNOSIS — R0602 Shortness of breath: Secondary | ICD-10-CM

## 2020-05-14 LAB — FULL PFT STUDY WITH BRONCHODILATOR
DLCO %Pred: 51 %
FEV1 %Pred-Post: 106 %
FEV1 %Pred-Pre: 102 %
FEV1/FVC-Post: 95 %
FEV1/FVC-Pre: 94 %
TLC %Pred: 90 %

## 2020-05-19 ENCOUNTER — Ambulatory Visit: Admit: 2020-05-19 | Discharge: 2020-05-19 | Payer: MEDICARE | Attending: Pulmonary Disease | Primary: Family

## 2020-05-19 DIAGNOSIS — J4530 Mild persistent asthma, uncomplicated: Secondary | ICD-10-CM

## 2020-05-19 NOTE — Progress Notes (Signed)
Subjective:   CHIEF COMPLAINT / HPI:  Doing fair      Past Medical History:  Past Medical History:   Diagnosis Date   ??? Arthritis    ??? Atrial fibrillation ([place])    ??? Bleeding ulcer 08/2018   ??? Cancer [facility])     Prostate cancer dx March 2010- tx with radiation    ??? Colon cancer ([place]) 2000    per old chart dx with colon ca - tx with surgery only   ??? Diabetes mellitus ([place])     "was borderline- at one time was on Metformin for several yrs-  when I went to [facility] they had me stop this in 2017"   ??? Fracture     "had broken neck in Nov 2016- wore a collar for 6 weeks only"   ??? HOH (hard of hearing)     bil hearing aide   ??? Hx of blood clots     "had blood clot - it was a  blood clot in  my esophagus "   ??? Hx of Doppler ultrasound 09/19/2019    EF IS 55-60%  Bi atrial enlargement noted.  Moderate mitral, aortic, tricuspid and mild pulmonic regurgitation is present   ??? Hx of fall     "last fall 2016- use cane walker and have electric chair"   ??? Hyperlipidemia    ??? Hypertension     follow with Dr Freeman Caldron at [facility]   ??? Limited range of motion (ROM) of shoulder     "right shoulder haved 10 % usage and left shoulder 60 % of usage"   ??? Neuropathy     both feet   ??? Sleep apnea     "had sleep study- tried to give me cpap but could not tolerate it "   ??? TIA (transient ischemic attack)     'in Jan 2009- trouble with speech, numbness of lip, weakness right arm - all only lasted for 20 seconds"   ??? Unsteady gait     "hx of back problem- was on steroids for 4 yrs- since then stability issues with my back -    ??? Wears glasses     to read        Past Surgical History:        Procedure Laterality Date   ??? ABDOMEN SURGERY  2000    per old chart had right hemicolectomy "removed 2 and a half feet of colon and my appendix"   ??? COLONOSCOPY  2019   ??? ENDOSCOPY, COLON, DIAGNOSTIC      per old chart had EGD done 08/2018 and 09/2018   ??? HERNIA REPAIR      per old chart open left ing hernia 1980 and then recurrent left ing hernia repair  08/2018   ??? JOINT REPLACEMENT      per old chart total left knee 2018   ??? PACEMAKER PLACEMENT      per old chart hx of st jude pacer insertion 2008- new pacer inserted 2019   ??? SHOULDER SURGERY Left 2015    rota. cuff   ??? SHOULDER SURGERY Right 02/27/2019    RIGHT REVERSE SHOULDER TOTAL ARTHROPLASTY REVERSE performed by Avel Sensor, MD at [facility] OR       Current Medications:    No current facility-administered medications for this visit.    Allergies   Allergen Reactions   ??? Cat Hair Extract Other (See Comments)     Runny nose   ???  Dust Mite Extract Other (See Comments)     Runny nose   ??? Other      Other reaction(s): Other (See Comments)  Dust/cats - sneezing   ??? Fish Allergy Nausea And Vomiting     Salmon only  Salmon only       Social History:    Social History     Socioeconomic History   ??? Marital status: Married     Spouse name: None   ??? Number of children: None   ??? Years of education: None   ??? Highest education level: None   Occupational History   ??? None   Tobacco Use   ??? Smoking status: Never Smoker   ??? Smokeless tobacco: Never Used   Vaping Use   ??? Vaping Use: Never used   Substance and Sexual Activity   ??? Alcohol use: Yes     Comment: "maybe 6 times per year" "when had prostate cancer did drink one glass red wine daily in the past   ??? Drug use: Never   ??? Sexual activity: None   Other Topics Concern   ??? None   Social History Narrative   ??? None     Social Determinants of Health     Financial Resource Strain:    ??? Difficulty of Paying Living Expenses: Not on file   Food Insecurity:    ??? Worried About Charity fundraiser in the Last Year: Not on file   ??? Ran Out of Food in the Last Year: Not on file   Transportation Needs:    ??? Lack of Transportation (Medical): Not on file   ??? Lack of Transportation (Non-Medical): Not on file   Physical Activity:    ??? Days of Exercise per Week: Not on file   ??? Minutes of Exercise per Session: Not on file   Stress:    ??? Feeling of Stress : Not on file   Social Connections:     ??? Frequency of Communication with Friends and Family: Not on file   ??? Frequency of Social Gatherings with Friends and Family: Not on file   ??? Attends Religious Services: Not on file   ??? Active Member of Clubs or Organizations: Not on file   ??? Attends Archivist Meetings: Not on file   ??? Marital Status: Not on file   Intimate Partner Violence:    ??? Fear of Current or Ex-Partner: Not on file   ??? Emotionally Abused: Not on file   ??? Physically Abused: Not on file   ??? Sexually Abused: Not on file   Housing Stability:    ??? Unable to Pay for Housing in the Last Year: Not on file   ??? Number of Places Lived in the Last Year: Not on file   ??? Unstable Housing in the Last Year: Not on file       Family History:   Family History   Problem Relation Age of Onset   ??? Cancer Mother         lung cancer   ??? Diabetes Mother    ??? Diabetes Sister    ??? Cancer Brother    ??? High Blood Pressure Brother        Immunization:    There is no immunization history on file for this patient.      REVIEW OF SYSTEMS:    CONSTITUTIONAL:  negative for fevers, chills, diaphoresis, activity change, appetite change, fatigue, night sweats and unexpected weight change.  EYES:  negative for blurred vision, eye discharge, visual disturbance and icterus  HEENT:  negative for hearing loss, tinnitus, ear drainage, sinus pressure, nasal congestion, epistaxis and snoring  RESPIRATORY:  See HPI  CARDIOVASCULAR:  negative for chest pain, palpitations, exertional chest pressure/discomfort, edema, syncope  GASTROINTESTINAL:  negative for nausea, vomiting, diarrhea, constipation, blood in stool and abdominal pain  GENITOURINARY:  negative for frequency, dysuria and hematuria  HEMATOLOGIC/LYMPHATIC:  negative for easy bruising, bleeding and lymphadenopathy  ALLERGIC/IMMUNOLOGIC:  negative for recurrent infections, angioedema, anaphylaxis and drug reactions  ENDOCRINE:  negative for weight changes and diabetic symptoms including polyuria, polydipsia and  polyphagia    MUSCULOSKELETAL:  negative for  pain, joint swelling, decreased range of motion and muscle weakness  NEUROLOGICAL:  negative for headaches, slurred speech, unilateral weakness  PSYCHIATRIC/BEHAVIORAL: negative for hallucinations, behavioral problems, confusion and agitation.     Objective:   PHYSICAL EXAM:      VITALS:    Vitals:    05/19/20 0915   Pulse: 86   Resp: 18   SpO2: 96%   Weight: 168 lb (76.2 kg)   Height: 5\' 7"  (1.702 m)         CONSTITUTIONAL:  awake, alert, cooperative, no apparent distress, and appears stated age  NECK:  Supple, symmetrical, trachea midline, no adenopathy, thyroid symmetric, not enlarged and no tenderness  CHEST: Chest expansion equal and symmetrical, no intercostal retraction.  LUNGS:  no increased work of breathing, has expiratory wheezes both lungs, no crackles.  CARDIOVASCULAR: S1 and S2, no edema and no JVD  ABDOMEN:  normal bowel sounds, non-distended and no masses palpated, and no tenderness to palpation. No hepatospleenomegaly  LYMPHADENOPATHY:  no axillary or supraclavicular adenopathy. No cervical adnenopathy  PSYCHIATRIC: Oriented to person place and time. No obvious depression or anxiety.  MUSCULOSKELETAL: No obvious misalignment or effusion of the joints. No clubbing, cyanosis of the digits.  RIGHT AND LEFT LOWER EXTREMITIES: No edema, no inflammation, no tenderness.  SKIN:  normal skin color, texture, turgor and no redness, warmth, or swelling. No palpable nodules    DATA:                        Assessment:     1. Mild asthma          Plan:     1. D/w pt  2. Cont albuterol  3. rtc 6 months

## 2020-06-09 ENCOUNTER — Ambulatory Visit: Payer: MEDICARE | Attending: Cardiovascular Disease | Primary: Family

## 2020-06-09 DIAGNOSIS — Z950 Presence of cardiac pacemaker: Secondary | ICD-10-CM

## 2020-07-10 ENCOUNTER — Inpatient Hospital Stay: Admit: 2020-07-10 | Discharge: 2020-07-10 | Payer: MEDICARE | Primary: Family

## 2020-07-10 DIAGNOSIS — D472 Monoclonal gammopathy: Secondary | ICD-10-CM

## 2020-07-10 LAB — CBC WITH AUTO DIFFERENTIAL
Basophils %: 1.8 % — ABNORMAL HIGH (ref 0–1)
Basophils Absolute: 0.1 10*3/uL
Eosinophils %: 1.4 % (ref 0–3)
Eosinophils Absolute: 0.1 10*3/uL
Hematocrit: 33.8 % — ABNORMAL LOW (ref 42–52)
Hemoglobin: 11.1 GM/DL — ABNORMAL LOW (ref 13.5–18.0)
Lymphocytes %: 24.5 % (ref 24–44)
Lymphocytes Absolute: 1.8 10*3/uL
MCH: 28.9 PG (ref 27–31)
MCHC: 32.8 % (ref 32.0–36.0)
MCV: 88 FL (ref 78–100)
MPV: 10.3 FL (ref 7.5–11.1)
Monocytes %: 16 % — ABNORMAL HIGH (ref 0–4)
Monocytes Absolute: 1.2 10*3/uL
Platelets: 64 10*3/uL — ABNORMAL LOW (ref 140–440)
RBC: 3.84 10*6/uL — ABNORMAL LOW (ref 4.6–6.2)
RDW: 21.2 % — ABNORMAL HIGH (ref 11.7–14.9)
Segs Absolute: 4.2 10*3/uL
Segs Relative: 56.3 % (ref 36–66)
WBC: 7.4 10*3/uL (ref 4.0–10.5)

## 2020-07-10 LAB — COMPREHENSIVE METABOLIC PANEL
ALT: 15 U/L (ref 10–40)
AST: 19 IU/L (ref 15–37)
Albumin: 4.4 GM/DL (ref 3.4–5.0)
Alkaline Phosphatase: 79 IU/L (ref 40–129)
Anion Gap: 11 (ref 4–16)
BUN: 19 MG/DL (ref 6–23)
CO2: 22 MMOL/L (ref 21–32)
Calcium: 8.8 MG/DL (ref 8.3–10.6)
Chloride: 106 mMol/L (ref 99–110)
Creatinine: 1 MG/DL (ref 0.9–1.3)
GFR African American: >60 mL/min/{1.73_m2} (ref >60)
GFR Non-African American: >60 mL/min/{1.73_m2} (ref >60)
Glucose: 102 MG/DL — ABNORMAL HIGH (ref 70–99)
Potassium: 5 MMOL/L (ref 3.5–5.1)
Sodium: 139 MMOL/L (ref 135–145)
Total Bilirubin: 0.9 MG/DL (ref 0.0–1.0)
Total Protein: 6.2 GM/DL — ABNORMAL LOW (ref 6.4–8.2)

## 2020-07-10 LAB — FERRITIN: Ferritin: 130 NG/ML (ref 30–400)

## 2020-07-10 LAB — RETICULOCYTES: Retic Ct Pct: 2 % (ref 0.2–2.20)

## 2020-07-10 LAB — IRON AND TIBC
Iron: 127 ug/dL (ref 59–158)
TIBC: 250 ug/dL (ref 250–450)
Transferrin %: 51 % — ABNORMAL HIGH (ref 10–44)
UIBC: 123 ug/dL (ref 110–370)

## 2020-07-10 LAB — VITAMIN B12 & FOLATE
Folate: 11.8 NG/ML (ref 3.1–17.5)
Vitamin B-12: 369.7 pg/ml (ref 211–911)

## 2020-07-11 ENCOUNTER — Encounter

## 2020-07-15 ENCOUNTER — Encounter: Primary: Family

## 2020-07-15 ENCOUNTER — Encounter: Admit: 2020-07-15 | Discharge: 2020-07-15 | Payer: MEDICARE | Attending: Internal Medicine | Primary: Family

## 2020-07-15 DIAGNOSIS — I4891 Unspecified atrial fibrillation: Secondary | ICD-10-CM

## 2020-07-15 NOTE — Patient Instructions (Signed)
Please be informed that if you contact our office outside of normal business hours the physician on call cannot help with any scheduling or rescheduling issues, procedure instruction questions or any type of medication issue.    We advise you for any urgent/emergency that you go to the nearest emergency room!    PLEASE CALL OUR OFFICE DURING NORMAL BUSINESS HOURS    Monday - Friday   8 am to 5 pm    [place]: [phone]    [place]: [phone]    [place]:  [phone]  **It is YOUR responsibilty to bring medication bottles and/or updated medication list to EACH APPOINTMENT. This will allow us to better serve you and all your healthcare needs**  [place] [facility] [place] [facility] Locations - No appointment necessary.  Sites open Monday to Friday. Call your preferred location for test preparation, business hours and other information you need. [facility] accepts all insurances.    [place] [facility]  [address] [place] [address]  Phone: [phone] [place] [facility]  [address] [place] [address]  Phone: [phone]

## 2020-07-15 NOTE — Progress Notes (Signed)
[name], MD                                  CARDIOLOGY  NOTE         Chief Complaint:    Chief Complaint   Patient presents with   ??? 6 Month Follow-Up     Pt denies any new cardiac sx, no surgeries or procedures scheduled that he is aware of PT did not bring medications or list to todays appt, PT states he does not need any refills at this time        Tolerating oral anticoagulation well  No spontaneous bleeding noted  Denies any chest pain shortness of breath  No melena or hematochezia    No hospitalizations since last visit      Echo: 09/19/2019    Left ventricular function is normal, EF is estimated at 55-60%.   Mild left ventricular hypertrophy.   Diastolic dysfunction could not be evaluated due to arrhythmia.   Abnormal (paradoxical) motion consistent with RV pacemaker.   No regional wall motion abnormalities were detected.   Bi atrial enlargement noted.   Sclerotic, but non-stenotic aortic valve.   Mitral annular calcification is present.   Moderate mitral, aortic, tricuspid and mild pulmonic regurgitation is   present.   Mild pulmonary hypertension with an RVSP of 81mmHg.   Dilation of the aortic root(4.0) and the ascending aorta(4.2).   No evidence of pericardial effusion.      Prior HPI:     Bertrum is a 87 y.o. year old male who was seen in the hospital for chronic atrial fibrillation.  Patient has history of chronic thrombocytopenia.  He has recently moved into town to take care of his wife.  Patient also has a history of LV thrombus in the past.    Chronic atrial fibrillation  CHA2DS2-VASc score is 6     EKG: Atrial fibrillation at 66 bpm      Current Outpatient Medications   Medication Sig Dispense Refill   ??? ferrous gluconate 324 (37.5 Fe) MG TABS Take 1 tablet by mouth 2 times daily 60 tablet 3   ??? apixaban (ELIQUIS) 2.5 MG TABS tablet Take 1 tablet by mouth 2 times daily 180 tablet 3   ??? ketoconazole (NIZORAL) 2 % shampoo WASH SCALP EVERY OTHER DAY LEAVE ON 3-5 MINUTES BEFORE RINSING     ???  ketoconazole (NIZORAL) 2 % cream APPLY TOPICALLY TO FACE AND NECK EVERY DAY AS NEEDED WHILE FLARING     ??? triamcinolone (KENALOG) 0.1 % cream      ??? ammonium lactate (AMLACTIN) 12 % cream      ??? cetirizine (ZYRTEC) 10 MG tablet Take 10 mg by mouth daily     ??? pantoprazole (PROTONIX) 40 MG tablet Take 1 tablet by mouth 2 times daily (before meals) 60 tablet 2   ??? guaiFENesin (MUCINEX) 600 MG extended release tablet Take 1,200 mg by mouth 2 times daily     ??? alfuzosin (UROXATRAL) 10 MG extended release tablet Take 10 mg by mouth daily     ??? atorvastatin (LIPITOR) 40 MG tablet Take 40 mg by mouth daily     ??? metoprolol succinate (TOPROL XL) 50 MG extended release tablet Take 50 mg by mouth nightly      ??? albuterol sulfate (PROAIR RESPICLICK) 376 (90 Base) MCG/ACT aerosol powder inhalation 2 puffs every 6 hours as needed      ???  montelukast (SINGULAIR) 10 MG tablet 10 mg nightly        No current facility-administered medications for this visit.       Allergies:     Cat hair extract, Dust mite extract, Other, and Fish allergy    Patient History:    Past Medical History:   Diagnosis Date   ??? Arthritis    ??? Atrial fibrillation ([place])    ??? Bleeding ulcer 08/2018   ??? Cancer [facility])     Prostate cancer dx March 2010- tx with radiation    ??? Colon cancer ([place]) 2000    per old chart dx with colon ca - tx with surgery only   ??? Diabetes mellitus ([place])     "was borderline- at one time was on Metformin for several yrs-  when I went to [facility] they had me stop this in 2017"   ??? Fracture     "had broken neck in Nov 2016- wore a collar for 6 weeks only"   ??? HOH (hard of hearing)     bil hearing aide   ??? Hx of blood clots     "had blood clot - it was a  blood clot in  my esophagus "   ??? Hx of Doppler ultrasound 09/19/2019    EF IS 55-60%  Bi atrial enlargement noted.  Moderate mitral, aortic, tricuspid and mild pulmonic regurgitation is present   ??? Hx of fall     "last fall 2016- use cane walker and have electric chair"   ???  Hyperlipidemia    ??? Hypertension     follow with Dr [facility]   ??? Limited range of motion (ROM) of shoulder     "right shoulder haved 10 % usage and left shoulder 60 % of usage"   ??? Neuropathy     both feet   ??? Sleep apnea     "had sleep study- tried to give me cpap but could not tolerate it "   ??? TIA (transient ischemic attack)     'in Jan 2009- trouble with speech, numbness of lip, weakness right arm - all only lasted for 20 seconds"   ??? Unsteady gait     "hx of back problem- was on steroids for 4 yrs- since then stability issues with my back -    ??? Wears glasses     to read      Past Surgical History:   Procedure Laterality Date   ??? ABDOMEN SURGERY  2000    per old chart had right hemicolectomy "removed 2 and a half feet of colon and my appendix"   ??? COLONOSCOPY  2019   ??? ENDOSCOPY, COLON, DIAGNOSTIC      per old chart had EGD done 08/2018 and 09/2018   ??? HERNIA REPAIR      per old chart open left ing hernia 1980 and then recurrent left ing hernia repair 08/2018   ??? JOINT REPLACEMENT      per old chart total left knee 2018   ??? PACEMAKER PLACEMENT      per old chart hx of st jude pacer insertion 2008- new pacer inserted 2019   ??? SHOULDER SURGERY Left 2015    rota. cuff   ??? SHOULDER SURGERY Right 02/27/2019    RIGHT REVERSE SHOULDER TOTAL ARTHROPLASTY REVERSE performed by Avel Sensor, MD at [facility] OR     Family History   Problem Relation Age of Onset   ??? Cancer Mother  lung cancer   ??? Diabetes Mother    ??? Diabetes Sister    ??? Cancer Brother    ??? High Blood Pressure Brother      Social History     Tobacco Use   ??? Smoking status: Never Smoker   ??? Smokeless tobacco: Never Used   Substance Use Topics   ??? Alcohol use: Yes     Comment: "maybe 6 times per year" "when had prostate cancer did drink one glass red wine daily in the past        Review of Systems:     ?? Constitutional:  No Fever or Weight Loss   ?? Eyes: No Decreased Vision  ?? ENT: No Headaches, Hearing Loss or Vertigo  ?? Cardiovascular: No  Chest Pain,  No Shortness of breath, No Palpitations. No Edema   ?? Respiratory: No cough or wheezing . No Respiratory distress   ?? Gastrointestinal: No abdominal pain, appetite loss, blood in stools, constipation, diarrhea or heartburn  ?? Genitourinary: No dysuria, trouble voiding, or hematuria  ?? Musculoskeletal:  denies any new  joint aches , or pain   ?? Integumentary: No rash or pruritis  ?? Neurological: No TIA or stroke symptoms  ?? Psychiatric: No anxiety or depression  ?? Endocrine: No malaise, fatigue or temperature intolerance  ?? Hematologic/Lymphatic: No bleeding problems, blood clots or swollen lymph nodes  ?? Allergic/Immunologic: No nasal congestion or hives        Objective:      Physical Exam:    BP 102/60    Pulse 72    Ht 5\' 7"  (1.702 m)    Wt 173 lb (78.5 kg)    BMI 27.10 kg/m??   Wt Readings from Last 3 Encounters:   07/15/20 173 lb (78.5 kg)   05/19/20 168 lb (76.2 kg)   04/14/20 168 lb (76.2 kg)     Body mass index is 27.1 kg/m??.  Vitals:    07/15/20 0900   BP: 102/60   Pulse: 72        General Appearance and Constitutional: Conversant, Well developed, Well nourished, No acute distress, Non-toxic appearance.   HEENT:  Normocephalic, Atraumatic, Bilateral external ears normal, Oropharynx moist, No oral exudates,   Nose normal.   Neck- Normal range of motion, No tenderness, Supple  Eyes:  EOMI, Conjunctiva normal, No discharge.   Respiratory:  Normal breath sounds, No respiratory distress, No wheezing, No Rales, No Ronchi.  No chest tenderness.   Cardiovascular: S1-S2 IRIR, no added heart sounds, No Mumurs appreciated. No gallops, rubs. No Pedal Edema   GI:  Bowel sounds normal, Soft, No tenderness,  GU:  No costovertebral angle tenderness   Musculoskeletal:  No gross deformities. Back- No tenderness  Integument:  Well hydrated, no rash   Lymphatic:  No lymphadenopathy noted   Neurologic:  Alert & oriented x 3, Normal motor function, normal sensory function, no focal deficits noted   Psychiatric:   Speech and behavior appropriate       Medical decision making and Data review:    DATA:    Lab Results   Component Value Date    TROPONINT <0.010 01/13/2020     BNP:    Lab Results   Component Value Date    PROBNP 2,032 (H) 01/13/2020     PT/INR:  No results found for: PTINR  No results found for: LABA1C  Lab Results   Component Value Date    CHOL 80 10/18/2019    TRIG  62 10/18/2019    HDL 31 (A) 10/18/2019    LDLCALC 35 10/18/2019     Lab Results   Component Value Date    WBC 7.4 07/10/2020    HGB 11.1 (L) 07/10/2020    HCT 33.8 (L) 07/10/2020    MCV 88.0 07/10/2020    PLT 64 (L) 07/10/2020     TSH: No results found for: TSH  Lab Results   Component Value Date    AST 19 07/10/2020    ALT 15 07/10/2020    BILITOT 0.9 07/10/2020    ALKPHOS 79 07/10/2020         All labs, medications and tests reviewed by myself including data and history from outside source , patient and available family .      1. Atrial fibrillation, unspecified type ([place])    2. Anemia, unspecified type    3. Essential hypertension    4. Dyslipidemia         Impression and Plan:    1. Chronic atrial fibrillation,??with history of TIA as well as hx of LV thrombus. ??CHA2DS2-VASc score is > 6. on low-dose Eliquis understanding high risk of ischemic stroke versus bleeding risk. Cont BB  - No LV thrombus noted on last Echo     2. Anemia as well as thrombocytopenia ( Follows with Dr. Franchot Mimes) last hemoglobin 11.1.  Stable.  On iron supplements.  Can be observed on low-dose OAC.     3. Hyperlipidemia: Cont lipitor 40 mg daily      4. HTN: Cont Metoprolol. BP well controlled. Pt had episodes of hypotension, but is normotensive with meds for the most part, also rate controlled     5. Hyperlipidemia: Continue with Lipitor 40 mg daily    6. SSS s.p PPM . VVIR - Follow up with pacer clinic as outpt??- Pacemaker report reviewed 06/09/20       Return in about 6 months (around 01/14/2021).          Counseled extensively and medication compliance urged.  We discussed  that for the  prevention of ASCVD our  goal is aggressive risk modification.Patient is encouraged to exercise even a brisk walk for 30 minutes  at least 3 to 4 times a week   Various goals were discussed and questions answered. Continue current medications. Appropriate prescriptions are addressed and refills ordered.  Questions answered and patient verbalizes understanding.  Call for any problems, questions, or concerns.

## 2020-07-15 NOTE — Progress Notes (Signed)
CHA2DS2-VASc Score for Atrial Fibrillation Stroke Risk   Risk   Factors  Component Value   C CHF No 0   H HTN Yes 1   A2 Age >= 11 Yes,  (87 y.o.) 2   D DM No 0   S2 Prior Stroke/TIA No 0   V Vascular Disease No 0   A Age 37-74 No,  (87 y.o.) 0   Sc Sex male 0    CHA2DS2-VASc  Score  3   Score last updated 07/15/20 4:58 AM EDT    Click here for a link to the UpToDate guideline "Atrial Fibrillation: Anticoagulation therapy to prevent embolization    Disclaimer: Risk Score calculation is dependent on accuracy of patient problem list and past encounter diagnosis.

## 2020-07-17 ENCOUNTER — Encounter: Admit: 2020-07-17 | Discharge: 2020-07-17 | Payer: MEDICARE | Attending: Internal Medicine | Primary: Family

## 2020-07-17 ENCOUNTER — Inpatient Hospital Stay: Admit: 2020-07-17 | Discharge: 2020-07-17 | Payer: MEDICARE | Primary: Family

## 2020-07-17 DIAGNOSIS — D472 Monoclonal gammopathy: Secondary | ICD-10-CM

## 2020-07-17 NOTE — Progress Notes (Signed)
Patient Name:  Dalton Warner  Patient DOB:  1930-05-19  Patient MRN:  [id number]     Primary Oncologist: Charisse Klinefelter, MD  Referring Provider: Carloyn Manner, APRN - CNP     Date of Service: 07/17/2020      Reason for Consult:  Thrombocytopenia     Chief Complaint:    Chief Complaint   Patient presents with   ??? Follow-up       Encounter Diagnoses   Name Primary?   ??? Monoclonal gammopathy Yes   ??? Anemia, unspecified type    ??? Thrombocytopenia ([place])         HPI:   11/22/18: Arrived alone to the clinic today. Loves to talk.  According to Dr. Hortense Ramal his oncologist at [facility] he was diagnosed with prostate cancer in 2010.  Received external beam radiation therapy.  He has been on Xarelto since 2017 for A. fib.  Prior to which he was on Coumadin since 2008.  Further work-up was negative for hepatitis B, hepatitis C and HIV.  No evidence of hemolysis.  B12 was on lower side.  Folate was normal.  Iron studies were normal.  Serum protein electrophoresis was normal.  But imaging of fixation revealed a small IgG lambda monoclonal band.  Was advised to be on B12 supplements.  Today at the office he reported that he had hernia surgery on July 24 when he received platelet transfusion.  Reported that his been caregiver to his wife for 20 years and is currently at [facility] home at the other mass unit.  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  Married for 67 years.  Lost a lot of weight in the process of taking care of his wife.  Reported tiredness.  No bleeding or any rash.  No chest pain.  Reported arthritis pain all the time.  Reported that he is taking oral B12 supplements thousand micrograms daily.    11/02/2016: LFY:BOFBPZWCHEN review demonstrates a bone marrow that is approximately 30% cellular with trilineage hematopoiesis and 1-2% blasts based on the manual differential count, as confirmed by immunophenotypic studies. There are mild myeloid dyspoietic changes as well  as minimal erythroid atypia. Although reactive etiologies including medications, toxins and nutritional deficiencies must be excluded, the patient's cytopenias and bone marrow morphologic findings raise the possibility of an underlying myelodysplastic syndrome.  FISH/Cytogenetics: Normal    07/20/2018 CBC with WBC of 6.52 hemoglobin of 11.0 hematocrit 34.7 MCV of 93.3 platelets of 78 diff within normal limits    March 2020  platelet count was 81.    Jan 2020  it was 85K    09/2018: CT chest:1. Ectasia of the ascending thoracic aorta measuring 4.2 cm in short-axis diameter. ??This may be due to aortic stenosis considering the aortic valve calcifications present.  2. No evidence of aortic dissection.  3. Atherosclerotic calcification of the 3 major native coronary arteries.  4. Moderate dilation of cardiac chambers.  5. Evidence of prior granulomatous disease with no definite pneumonia or suspicious-appearing pulmonary nodules.  6. Trace right pleural effusion.  7. Mild fatty infiltration of the liver.  8. Small sliding-type hiatal hernia.    09/08/18: Ultrasound:1. Mild ascites evident in the left upper quadrant.  2. Nonvisualized pancreas and majority of the abdominal aorta due to overlying bowel gas.  3. Otherwise unremarkable study including no evidence of cirrhosis and normal splenic size measuring 9.6 cm.    12/29/18: Ct shoulder:  1. Interstitial tearing of the subscapularis, supraspinatus, and   infraspinatus tendons with full-thickness tearing predominantly along the   conjoined fibers of the supraspinatus and infraspinatus tendons which   measures approximately 1.0 x 1.4 cm. ??There is also full-thickness   perforation of the anterior fibers of the supraspinatus tendon. ??Underlying   diffuse mild atrophy of the rotator cuff musculature.   2. Severe advanced osteoarthritis of the glenohumeral joint with underlying   diffuse complex labral tearing.   3. Severe osteoarthritis of the acromioclavicular joint.   4.  Synovitis.     Feb 27 2019:Right rotator cuff surgery. Lost lot of blood and received 3 units of PRBCs.     Feb 27 2019:  Unremarkable appearance of reverse glenohumeral arthroplasty.   ??   AC joint osteoarthritis.   ??     01/13/20:Presented to [facility] with sx of fatigue, Acute bronchitis, reflux sx, hans and fett feeling cold. generalized arthritis pains. No overt bleeding. No bruising. Palpitations. No pain, increased sob. No dizziness.      Feb 2022 CXR normal      Past Medical History:     A. fib, osteoarthritis, borderline diabetes.  Seasonal allergies, prostate cancer hypertension.                                                           Past Surgery History:      Basal cell skin cancer removal, cardiac pacemaker, colectomy for diverticular disease, hernia repair, left knee replacement, appendectomy                                                              Social History:   Lives alone.  Has 3 children, they live in [place] in [place].  Retired as Financial trader, [employer] position.  Denies any smoking history or any other illicit drug abuse.                                                                                                    Family History:    Mother was a non-smoker was diagnosed with lung cancer.  Younger brother with colon cancer.  His sister was diagnosed with uterine, ovarian cancer at the age of 38.  Allergies   Allergen Reactions   ??? Cat Hair Extract Other (See Comments)     Runny nose   ??? Dust Mite Extract Other (See Comments)     Runny nose   ??? Other      Other reaction(s): Other (See Comments)  Dust/cats - sneezing   ??? Fish Allergy Nausea And Vomiting     [place] only  [place] only       Current Outpatient Medications on File Prior to Visit   Medication Sig Dispense Refill   ??? apixaban (ELIQUIS) 2.5 MG TABS tablet Take 1 tablet by mouth 2 times daily 180 tablet 3   ??? ketoconazole (NIZORAL) 2 %  shampoo WASH SCALP EVERY OTHER DAY LEAVE ON 3-5 MINUTES BEFORE RINSING     ??? ketoconazole (NIZORAL) 2 % cream APPLY TOPICALLY TO FACE AND NECK EVERY DAY AS NEEDED WHILE FLARING     ??? triamcinolone (KENALOG) 0.1 % cream      ??? ammonium lactate (AMLACTIN) 12 % cream      ??? cetirizine (ZYRTEC) 10 MG tablet Take 10 mg by mouth daily     ??? pantoprazole (PROTONIX) 40 MG tablet Take 1 tablet by mouth 2 times daily (before meals) 60 tablet 2   ??? guaiFENesin (MUCINEX) 600 MG extended release tablet Take 1,200 mg by mouth 2 times daily     ??? alfuzosin (UROXATRAL) 10 MG extended release tablet Take 10 mg by mouth daily     ??? atorvastatin (LIPITOR) 40 MG tablet Take 40 mg by mouth daily     ??? metoprolol succinate (TOPROL XL) 50 MG extended release tablet Take 50 mg by mouth nightly      ??? albuterol sulfate (PROAIR RESPICLICK) 626 (90 Base) MCG/ACT aerosol powder inhalation 2 puffs every 6 hours as needed      ??? montelukast (SINGULAIR) 10 MG tablet 10 mg nightly      ??? ferrous gluconate 324 (37.5 Fe) MG TABS Take 1 tablet by mouth 2 times daily 60 tablet 3     No current facility-administered medications on file prior to visit.     Interval history:07/17/20: Arrived alone to the clinic today. Ambulates with the help of a walker. Gained some weight. Overall feeling OK. Gets plenty of rest during day but not able to sleep at night secondary to acid reflux. Wife is being taken care of at [facility]. No bleeding.No rash.  No fatigue.  Has been taking Oral iron OD. No PICA sx. No constipation. Not taking any B12 supplements. No dizziness.     Review of Systems:    As per the interval history, otherwise rest of the ros negative     Vital Signs: BP 109/62 (Site: Right Upper Arm, Position: Sitting, Cuff Size: Large Adult)    Pulse 77    Temp 97.8 ??F (36.6 ??C) (Temporal)    Resp 16    Ht 5' 7"  (1.702 m)    Wt 171 lb 9.6 oz (77.8 kg)    SpO2 98%    BMI 26.88 kg/m??      Physical Exam:  CONSTITUTIONAL: awake, alert ,Uses  walker for ambulation.   EYES:PEAR, no palor or any icetrus  RSW:NIOE  NECK: No JVD  HEMATOLOGIC/LYMPHATIC: no cervical, supraclavicular or axillary lymphadenopathy   LUNGS: CTAB  CARDIOVASCULAR: s1s2 irr irr ESM  ABDOMEN: soft ntnd bs pos  NEUROLOGIC: GI  SKIN: No rash  EXTREMITIES: no LE edema bilaterally.      Labs:  Hematology:  Lab Results   Component Value Date    WBC 7.4 07/10/2020    RBC 3.84 (L) 07/10/2020    HGB 11.1 (L) 07/10/2020    HCT 33.8 (L) 07/10/2020    MCV 88.0 07/10/2020    MCH 28.9 07/10/2020    MCHC 32.8 07/10/2020    RDW 21.2 (H) 07/10/2020    PLT 64 (L) 07/10/2020    MPV 10.3 07/10/2020    BANDSPCT 4 (L) 02/28/2019    SEGSPCT 56.3 07/10/2020    EOSRELPCT 1.4 07/10/2020    BASOPCT 1.8 (H) 07/10/2020    LYMPHOPCT 24.5 07/10/2020    MONOPCT 16.0 (H) 07/10/2020    BANDABS 0.50 02/28/2019    SEGSABS 4.2 07/10/2020    EOSABS 0.1 07/10/2020    BASOSABS 0.1 07/10/2020    LYMPHSABS 1.8 07/10/2020    MONOSABS 1.2 07/10/2020    DIFFTYPE AUTOMATED DIFFERENTIAL 07/10/2020    ANISOCYTOSIS 1+ 01/13/2020    WBCMORP OCCASIONAL 02/28/2019     Lab Results   Component Value Date    ESR 5 02/20/2019     Chemistry:  Lab Results   Component Value Date    NA 139 07/10/2020    K 5.0 07/10/2020    CL 106 07/10/2020    CO2 22 07/10/2020    BUN 19 07/10/2020    CREATININE 1.0 07/10/2020    GLUCOSE 102 (H) 07/10/2020    CALCIUM 8.8 07/10/2020    PROT 6.2 (L) 07/10/2020    LABALBU 4.4 07/10/2020    BILITOT 0.9 07/10/2020    ALKPHOS 79 07/10/2020    AST 19 07/10/2020    ALT 15 07/10/2020    LABGLOM >60 07/10/2020    GFRAA >60 07/10/2020    MG 1.9 01/13/2020    POCGLU 99 02/27/2019     Lab Results   Component Value Date    LDH 170 05/24/2019     No components found for: LD  Lab Results   Component Value Date    TSHHS 1.760 05/24/2019     Immunology:  Lab Results   Component Value Date    PROT 6.2 (L) 07/10/2020    SPEP  04/01/2020     INTERPRETATION - A faint free lambda light chains monoclonal band is detected. SAF    SPEP   04/01/2020     INTERPRETATION - Concurrent immunofixation detected a faint free lambda light chains monoclonal band, which is too small to measure. SAF    ALBUMINELP 3.6 04/01/2020    LABALPH 0.2 04/01/2020    LABALPH 0.6 04/01/2020    LABBETA 0.7 04/01/2020    GAMGLOB 0.9 04/01/2020     No results found for: KAPPAUVOL, LAMBDAUVOL, KLFLCR  No results found for: B2M  Coagulation Panel:  Lab Results   Component Value Date    PROTIME 17.0 (H) 03/03/2019    INR 1.40 03/03/2019    APTT 46.4 (H) 03/03/2019     Anemia Panel:  Lab Results   Component Value Date    VITAMINB12 369.7 07/10/2020    FOLATE 11.8 07/10/2020     Tumor Markers:  No results found for: CA125, CEA, CA199, LABCA2, PSA     Observations:  ECOG:  PHQ-9 Total Score: 0 (07/17/2020  8:56 AM)       Assessment & Plan:  Chronic thrombocytopenia: Etiology could be secondary to underlying MDS vs ITP.  Cytogenetics in the past were normal, blast being 2%, his IPSS score was about 1.5 which is a low score.  Stable Platelet 64K, in may  2022. Continue to monitor for now and discussed repeat BMB/aspiration for further evaluation and treatment plan if counts declining.    Normocytic anemia: Could be sec to early MDS vs mild iron def vs b12 def . No hemolysis.  Recommend oral iron/vitamin C and also b12 supplements. As mentioned above, consider further evaluation with BMB if anemia worsening.     Monoclonal gammopathy noted to have small IgG lambda band on the immunofixation but normal serum protein electrophoresis.  Normal renal function normal calcium levels.  Serum immunoglobulins and serum free light chains were normal.  No evidence of any myeloma defining disease other than anemia which is probably not sec to that.  Repeat panel in feb 2022 with very faint M protein to small to measure. Will monitor yearly    Paroxysmal atrial fibrillation.  Has pacemaker.  Is on Eliquis 2.5 mg po bid.   Continue as long as a  platelet count of more than 60 K.  Avoid falls, deep cuts and monitor closely for any bleeding.     H/o Colon cancer: s/p resection in 2000, did not receive any adjuvant treatment. Also Family history of colon cancer.  Reported that he is up-to-date with the colonoscopy.    H/O Prostate cancer: Diagnosed in 2010. S/P XRT. Is being followed by Urology.     GERD: Pharmacological intervention not really helping but restricting diet has been helping. Has been tried on dexlansoprazole as well. Is still followed by GI.     Family history ovarian cancer.  No genetic testing has been done and we discussed about that. He defers genetic testing at this point    Continue other medical care.    Discussed above findings and plan with him and he voiced understanding.  Answered all his questions.    Discussed healthy lifestyle including healthy diet, regular exercise as tolerated.  Also discussed importance of being up-to-date with age-appropriate screening tools. Colonoscopy looks like was done in 2019, but he said he would follow    Recommend follow-up with primary care physician and other specialists.    Please do not hesitate to contact us if you need further information.    Return to clinic or earlier if new symptoms.    [place]

## 2020-07-17 NOTE — Progress Notes (Signed)
MA Rooming Questions  Patient: Dalton Warner  MRN: [id number]    Date: 07/17/2020        1. Do you have any new issues?   no         2. Do you need any refills on medications?    no    3. Have you had any imaging done since your last visit?   no    4. Have you been hospitalized or seen in the emergency room since your last visit here?   no    5. Did the patient have a depression screening completed today? Yes    PHQ-9 Total Score: 0 (07/17/2020  8:56 AM)       PHQ-9 Given to (if applicable):               PHQ-9 Score (if applicable):                     []  Positive     []   Negative              Does question #9 need addressed (if applicable)                     []  Yes    []   No               Forest Becker, CMA

## 2020-09-08 ENCOUNTER — Ambulatory Visit: Payer: MEDICARE | Attending: Cardiovascular Disease | Primary: Family

## 2020-09-08 DIAGNOSIS — Z950 Presence of cardiac pacemaker: Secondary | ICD-10-CM

## 2020-09-29 ENCOUNTER — Inpatient Hospital Stay: Payer: MEDICARE | Primary: Family

## 2020-09-29 DIAGNOSIS — E119 Type 2 diabetes mellitus without complications: Secondary | ICD-10-CM

## 2020-09-29 LAB — COMPREHENSIVE METABOLIC PANEL
ALT: 18 U/L (ref 10–40)
ALT: 18 U/L (ref 10–40)
AST: 18 IU/L (ref 15–37)
AST: 19 IU/L (ref 15–37)
Albumin: 4.4 GM/DL (ref 3.4–5.0)
Albumin: 4.4 GM/DL (ref 3.4–5.0)
Alkaline Phosphatase: 72 IU/L (ref 40–128)
Alkaline Phosphatase: 75 IU/L (ref 40–129)
Anion Gap: 9 (ref 4–16)
Anion Gap: 9 (ref 4–16)
BUN: 18 MG/DL (ref 6–23)
BUN: 19 MG/DL (ref 6–23)
CO2: 24 MMOL/L (ref 21–32)
CO2: 24 MMOL/L (ref 21–32)
Calcium: 8.5 MG/DL (ref 8.3–10.6)
Calcium: 8.7 MG/DL (ref 8.3–10.6)
Chloride: 104 mMol/L (ref 99–110)
Chloride: 105 mMol/L (ref 99–110)
Creatinine: 0.9 MG/DL (ref 0.9–1.3)
Creatinine: 1 MG/DL (ref 0.9–1.3)
GFR African American: >60 mL/min/{1.73_m2} (ref >60)
GFR African American: >60 mL/min/{1.73_m2} (ref >60)
GFR Non-African American: >60 mL/min/{1.73_m2} (ref >60)
GFR Non-African American: >60 mL/min/{1.73_m2} (ref >60)
Glucose: 107 MG/DL — ABNORMAL HIGH (ref 70–99)
Glucose: 111 MG/DL — ABNORMAL HIGH (ref 70–99)
Potassium: 4.6 MMOL/L (ref 3.5–5.1)
Potassium: 4.6 MMOL/L (ref 3.5–5.1)
Sodium: 137 MMOL/L (ref 135–145)
Sodium: 138 MMOL/L (ref 135–145)
Total Bilirubin: 0.6 MG/DL (ref 0.0–1.0)
Total Bilirubin: 0.6 MG/DL (ref 0.0–1.0)
Total Protein: 6 GM/DL — ABNORMAL LOW (ref 6.4–8.2)
Total Protein: 6.1 GM/DL — ABNORMAL LOW (ref 6.4–8.2)

## 2020-09-29 LAB — HEMOGLOBIN A1C
Estimated Avg Glucose: 128 mg/dL
Hemoglobin A1C: 6.1 % (ref 4.2–6.3)

## 2020-09-29 LAB — CBC WITH AUTO DIFFERENTIAL
Basophils %: 0.7 % (ref 0–1)
Basophils Absolute: 0.1 10*3/uL
Eosinophils %: 0.9 % (ref 0–3)
Eosinophils Absolute: 0.1 10*3/uL
Hematocrit: 34.7 % — ABNORMAL LOW (ref 42–52)
Hemoglobin: 11.2 GM/DL — ABNORMAL LOW (ref 13.5–18.0)
Immature Neutrophil %: 1.1 % — ABNORMAL HIGH (ref 0–0.43)
Lymphocytes %: 26.6 % (ref 24–44)
Lymphocytes Absolute: 2 10*3/uL
MCH: 29 PG (ref 27–31)
MCHC: 32.3 % (ref 32.0–36.0)
MCV: 89.9 FL (ref 78–100)
Monocytes %: 11.1 % — ABNORMAL HIGH (ref 0–4)
Monocytes Absolute: 0.8 10*3/uL
Nucleated RBC %: 0 %
Platelets: 76 10*3/uL — ABNORMAL LOW (ref 140–440)
RBC: 3.86 10*6/uL — ABNORMAL LOW (ref 4.6–6.2)
RDW: 21.1 % — ABNORMAL HIGH (ref 11.7–14.9)
Segs Absolute: 4.4 10*3/uL
Segs Relative: 59.6 % (ref 36–66)
Total Immature Neutrophil: 0.08 10*3/uL
Total Nucleated RBC: 0 10*3/uL
WBC: 7.4 10*3/uL (ref 4.0–10.5)

## 2020-09-29 LAB — LDL CHOLESTEROL, DIRECT: LDL Direct: 64 MG/DL (ref <100)

## 2020-09-29 LAB — LIPID, FASTING
Cholesterol, Fasting: 105 MG/DL (ref <200)
HDL: 35 MG/DL — ABNORMAL LOW (ref >40)
LDL Calculated: 57 MG/DL (ref <100)
Triglyceride, Fasting: 65 MG/DL (ref <150)

## 2020-09-29 LAB — FOLATE: Folate: 10.2 NG/ML (ref 3.1–17.5)

## 2020-09-29 LAB — IRON AND TIBC
Iron: 129 ug/dL (ref 59–158)
TIBC: 261 ug/dL (ref 250–450)
Transferrin %: 49 % — ABNORMAL HIGH (ref 10–44)
UIBC: 132 ug/dL (ref 110–370)

## 2020-09-29 LAB — FERRITIN: Ferritin: 164 NG/ML (ref 30–400)

## 2020-09-29 LAB — VITAMIN B12: Vitamin B-12: 746.2 pg/ml (ref 211–911)

## 2020-10-21 ENCOUNTER — Inpatient Hospital Stay: Admit: 2020-10-21 | Discharge: 2020-10-21 | Payer: MEDICARE | Primary: Family

## 2020-10-21 ENCOUNTER — Ambulatory Visit: Admit: 2020-10-21 | Discharge: 2020-10-21 | Payer: MEDICARE | Attending: Internal Medicine | Primary: Family

## 2020-10-21 DIAGNOSIS — D472 Monoclonal gammopathy: Secondary | ICD-10-CM

## 2020-10-21 NOTE — Progress Notes (Signed)
MA Rooming Questions  Patient: Dalton Warner  MRN: [id number]    Date: 10/21/2020        1. Do you have any new issues?   no         2. Do you need any refills on medications?    no    3. Have you had any imaging done since your last visit?   no    4. Have you been hospitalized or seen in the emergency room since your last visit here?   no    5. Did the patient have a depression screening completed today? No    No data recorded     PHQ-9 Given to (if applicable):               PHQ-9 Score (if applicable):                     '[]'$  Positive     '[]'$   Negative              Does question #9 need addressed (if applicable)                     '[]'$  Yes    '[]'$   No               Darylene Price, CMA

## 2020-10-21 NOTE — Progress Notes (Signed)
Patient Name:  Dalton Warner  Patient DOB:  March 30, 1930  Patient MRN:  [id number]     Primary Oncologist: Charisse Klinefelter, MD  Referring Provider: Carloyn Manner, APRN - CNP     Date of Service: 10/21/2020      Reason for Consult:  Thrombocytopenia     Chief Complaint:    Chief Complaint   Patient presents with    Follow-up       Encounter Diagnoses   Name Primary?    Monoclonal gammopathy Yes    Anemia, unspecified type     Thrombocytopenia (HCC)         HPI:   11/22/18: Arrived alone to the clinic today. Loves to talk.  According to Dr. Hortense Ramal his oncologist at [facility] he was diagnosed with prostate cancer in 2010.  Received external beam radiation therapy.  He has been on Xarelto since 2017 for A. fib.  Prior to which he was on Coumadin since 2008.  Further work-up was negative for hepatitis B, hepatitis C and HIV.  No evidence of hemolysis.  B12 was on lower side.  Folate was normal.  Iron studies were normal.  Serum protein electrophoresis was normal.  But imaging of fixation revealed a small IgG lambda monoclonal band.  Was advised to be on B12 supplements.  Today at the office he reported that he had hernia surgery on July 24 when he received platelet transfusion.  Reported that his been caregiver to his wife for 20 years and is currently at [facility] at the other mass unit.  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  Married for 67 years.  Lost a lot of weight in the process of taking care of his wife.  Reported tiredness.  No bleeding or any rash.  No chest pain.  Reported arthritis pain all the time.  Reported that he is taking oral B12 supplements thousand micrograms daily.    11/02/2016: UJW:[id number] review demonstrates a bone marrow that is approximately 30% cellular with trilineage hematopoiesis and 1-2% blasts based on the manual differential count, as confirmed by immunophenotypic studies. There are mild myeloid dyspoietic changes as well as  minimal erythroid atypia. Although reactive etiologies including medications, toxins and nutritional deficiencies must be excluded, the patient's cytopenias and bone marrow morphologic findings raise the possibility of an underlying myelodysplastic syndrome.  FISH/Cytogenetics: Normal    07/20/2018 CBC with WBC of 6.52 hemoglobin of 11.0 hematocrit 34.7 MCV of 93.3 platelets of 78 diff within normal limits    March 2020  platelet count was 81.    Jan 2020  it was 85K    09/2018: CT chest:1. Ectasia of the ascending thoracic aorta measuring 4.2 cm in short-axis diameter.  This may be due to aortic stenosis considering the aortic valve calcifications present.  2. No evidence of aortic dissection.  3. Atherosclerotic calcification of the 3 major native coronary arteries.  4. Moderate dilation of cardiac chambers.  5. Evidence of prior granulomatous disease with no definite pneumonia or suspicious-appearing pulmonary nodules.  6. Trace right pleural effusion.  7. Mild fatty infiltration of the liver.  8. Small sliding-type hiatal hernia.    09/08/18: Ultrasound:1. Mild ascites evident in the left upper quadrant.  2. Nonvisualized pancreas and majority of the abdominal aorta due to overlying bowel gas.  3. Otherwise unremarkable study including no evidence of cirrhosis and normal splenic size measuring 9.6 cm.    12/29/18: Ct shoulder:  1. Interstitial tearing of the subscapularis, supraspinatus, and   infraspinatus tendons with full-thickness tearing predominantly along the   conjoined fibers of the supraspinatus and infraspinatus tendons which   measures approximately 1.0 x 1.4 cm.  There is also full-thickness   perforation of the anterior fibers of the supraspinatus tendon.  Underlying   diffuse mild atrophy of the rotator cuff musculature.   2. Severe advanced osteoarthritis of the glenohumeral joint with underlying   diffuse complex labral tearing.   3. Severe osteoarthritis of the acromioclavicular joint.   4.  Synovitis.     Feb 27 2019:Right rotator cuff surgery. Lost lot of blood and received 3 units of PRBCs.     Feb 27 2019:  Unremarkable appearance of reverse glenohumeral arthroplasty.       AC joint osteoarthritis.         01/13/20:Presented to [facility] with sx of fatigue, Acute bronchitis, reflux sx, hans and fett feeling cold. generalized arthritis pains. No overt bleeding. No bruising. Palpitations. No pain, increased sob. No dizziness.      Feb 2022 CXR normal      Past Medical History:     A. fib, osteoarthritis, borderline diabetes.  Seasonal allergies, prostate cancer hypertension.                                                           Past Surgery History:      Basal cell skin cancer removal, cardiac pacemaker, colectomy for diverticular disease, hernia repair, left knee replacement, appendectomy                                                              Social History:   Lives alone.  Has 3 children, they live in [place] in [place].  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  Denies any smoking history or any other illicit drug abuse.                                                                                                    Family History:    Mother was a non-smoker was diagnosed with lung cancer.  Younger brother with colon cancer.  His sister was diagnosed with uterine, ovarian cancer at the age of 33.  Allergies   Allergen Reactions    Cat Hair Extract Other (See Comments)     Runny nose    Dust Mite Extract Other (See Comments)     Runny nose    Other      Other reaction(s): Other (See Comments)  Dust/cats - sneezing    Fish Allergy Nausea And Vomiting     Salmon only  Salmon only       Current Outpatient Medications on File Prior to Visit   Medication Sig Dispense Refill    ferrous gluconate 324 (37.5 Fe) MG TABS Take 1 tablet by mouth 2 times daily 60 tablet 3    apixaban (ELIQUIS) 2.5 MG TABS  tablet Take 1 tablet by mouth 2 times daily 180 tablet 3    ketoconazole (NIZORAL) 2 % shampoo WASH SCALP EVERY OTHER DAY LEAVE ON 3-5 MINUTES BEFORE RINSING      ketoconazole (NIZORAL) 2 % cream APPLY TOPICALLY TO FACE AND NECK EVERY DAY AS NEEDED WHILE FLARING      triamcinolone (KENALOG) 0.1 % cream       ammonium lactate (AMLACTIN) 12 % cream       cetirizine (ZYRTEC) 10 MG tablet Take 10 mg by mouth daily      pantoprazole (PROTONIX) 40 MG tablet Take 1 tablet by mouth 2 times daily (before meals) 60 tablet 2    guaiFENesin (MUCINEX) 600 MG extended release tablet Take 1,200 mg by mouth 2 times daily      alfuzosin (UROXATRAL) 10 MG extended release tablet Take 10 mg by mouth daily      atorvastatin (LIPITOR) 40 MG tablet Take 40 mg by mouth daily      metoprolol succinate (TOPROL XL) 50 MG extended release tablet Take 50 mg by mouth nightly       albuterol sulfate (PROAIR RESPICLICK) 694 (90 Base) MCG/ACT aerosol powder inhalation 2 puffs every 6 hours as needed       montelukast (SINGULAIR) 10 MG tablet 10 mg nightly        No current facility-administered medications on file prior to visit.     Interval history:10/21/20: Arrived alone to the clinic today. Ambulates with the help of a walker. Overall feels average. Keeps his mind active and memory good. Very quite where he lives. Wife is being taken care of at [facility]. No bleeding. No rash. Gained weight.  No chest pain, dizziness. Has been taking Oral iron OD. No PICA sx. No constipation. Taking b12 daily as well. Reflux sx.     Review of Systems:    As per the interval history, otherwise rest of the ros negative     Vital Signs: BP (!) 107/59 (Site: Right Upper Arm, Position: Sitting, Cuff Size: Medium Adult)    Pulse 83    Temp 98.3 ??F (36.8 ??C) (Infrared)    Ht 5' 7"  (1.702 m)    Wt 176 lb 3.2 oz (79.9 kg)    SpO2 97%    BMI 27.60 kg/m??      Physical Exam:  CONSTITUTIONAL: awake, alert ,Uses walker for ambulation.   EYES:PEAR, no  palor or any icetrus  WNI:OEVO  NECK: No JVD  HEMATOLOGIC/LYMPHATIC: no cervical, supraclavicular or axillary lymphadenopathy   LUNGS: coarse bs left side   CARDIOVASCULAR: s1s2 irr irr ESM  ABDOMEN: soft ntnd bs pos  NEUROLOGIC: GI  SKIN: No rash  EXTREMITIES: no LE edema bilaterally.      Labs:  Hematology:  Lab Results  Component Value Date    WBC 7.4 09/29/2020    RBC 3.86 (L) 09/29/2020    HGB 11.2 (L) 09/29/2020    HCT 34.7 (L) 09/29/2020    MCV 89.9 09/29/2020    MCH 29.0 09/29/2020    MCHC 32.3 09/29/2020    RDW 21.1 (H) 09/29/2020    PLT 76 (L) 09/29/2020    MPV 10.3 07/10/2020    BANDSPCT 4 (L) 02/28/2019    SEGSPCT 59.6 09/29/2020    EOSRELPCT 0.9 09/29/2020    BASOPCT 0.7 09/29/2020    LYMPHOPCT 26.6 09/29/2020    MONOPCT 11.1 (H) 09/29/2020    BANDABS 0.50 02/28/2019    SEGSABS 4.4 09/29/2020    EOSABS 0.1 09/29/2020    BASOSABS 0.1 09/29/2020    LYMPHSABS 2.0 09/29/2020    MONOSABS 0.8 09/29/2020    DIFFTYPE AUTOMATED DIFFERENTIAL 09/29/2020    ANISOCYTOSIS 1+ 01/13/2020    WBCMORP OCCASIONAL 02/28/2019     Lab Results   Component Value Date    ESR 5 02/20/2019     Chemistry:  Lab Results   Component Value Date    NA 138 09/29/2020    NA 137 09/29/2020    K 4.6 09/29/2020    K 4.6 09/29/2020    CL 105 09/29/2020    CL 104 09/29/2020    CO2 24 09/29/2020    CO2 24 09/29/2020    BUN 18 09/29/2020    BUN 19 09/29/2020    CREATININE 1.0 09/29/2020    CREATININE 0.9 09/29/2020    GLUCOSE 107 (H) 09/29/2020    GLUCOSE 111 (H) 09/29/2020    CALCIUM 8.7 09/29/2020    CALCIUM 8.5 09/29/2020    PROT 6.1 (L) 09/29/2020    PROT 6.0 (L) 09/29/2020    LABALBU 4.4 09/29/2020    LABALBU 4.4 09/29/2020    BILITOT 0.6 09/29/2020    BILITOT 0.6 09/29/2020    ALKPHOS 72 09/29/2020    ALKPHOS 75 09/29/2020    AST 18 09/29/2020    AST 19 09/29/2020    ALT 18 09/29/2020    ALT 18 09/29/2020    LABGLOM >60 09/29/2020    LABGLOM >60 09/29/2020    GFRAA >60 09/29/2020    GFRAA >60 09/29/2020    MG 1.9 01/13/2020    POCGLU 99  02/27/2019     Lab Results   Component Value Date    LDH 170 05/24/2019     No components found for: LD  Lab Results   Component Value Date    TSHHS 1.760 05/24/2019     Immunology:  Lab Results   Component Value Date    PROT 6.1 (L) 09/29/2020    PROT 6.0 (L) 09/29/2020    SPEP  04/01/2020     INTERPRETATION - A faint free lambda light chains monoclonal band is detected. SAF    SPEP  04/01/2020     INTERPRETATION - Concurrent immunofixation detected a faint free lambda light chains monoclonal band, which is too small to measure. SAF    ALBUMINELP 3.6 04/01/2020    LABALPH 0.2 04/01/2020    LABALPH 0.6 04/01/2020    LABBETA 0.7 04/01/2020    GAMGLOB 0.9 04/01/2020     No results found for: KAPPAUVOL, LAMBDAUVOL, KLFLCR  No results found for: B2M  Coagulation Panel:  Lab Results   Component Value Date    PROTIME 17.0 (H) 03/03/2019    INR 1.40 03/03/2019    APTT 46.4 (H) 03/03/2019  Anemia Panel:  Lab Results   Component Value Date    VITAMINB12 746.2 09/29/2020    FOLATE 10.2 09/29/2020     Tumor Markers:  No results found for: CA125, CEA, CA199, LABCA2, PSA     Observations:  ECOG:  No data recorded       Assessment & Plan:                                                          Chronic thrombocytopenia: Etiology could be secondary to underlying MDS vs ITP.  Cytogenetics in the past were normal, blast being 2%, his IPSS score was about 1.5 which is a low score. No evidence of hemolysis, hepatitis, HIV in the past. Platelet counts 76K in sep 2022.  Continue to monitor for now and discussed repeat BMB/aspiration for further evaluation and treatment plan if counts declining.    Normocytic anemia: Could be sec to early MDS vs mild iron def vs b12 def . No hemolysis.  Recommend oral iron/vitamin C EOD  and also daily b12 supplements. As mentioned above, consider further evaluation with BMB if anemia worsening.     Monoclonal gammopathy noted to have small IgG lambda band on the immunofixation but normal serum  protein electrophoresis.  Normal renal function normal calcium levels.  Serum immunoglobulins and serum free light chains were normal.  No evidence of any myeloma defining disease other than anemia which is probably not sec to that.  Repeat panel in feb 2022 with very faint M protein too small to measure. Will monitor yearly    Paroxysmal atrial fibrillation.  Has pacemaker.  Is on Eliquis 2.5 mg po bid.   Continue as long as a platelet count of more than 60 K.  Avoid falls, deep cuts and monitor closely for any bleeding.     H/o Colon cancer: s/p resection in 2000, did not receive any adjuvant treatment. Also Family history of colon cancer.  Reported that he is up-to-date with the colonoscopy.    H/O Prostate cancer: Diagnosed in 2010. S/P XRT. Is being followed by Urology.     GERD: Pharmacological intervention not really helping but restricting diet has been helping. Has been tried on dexlansoprazole as well. Has been evaluated by GI    Family history ovarian cancer.  No genetic testing has been done and we discussed about that. He defers genetic testing at this point    Hypotension; Does note feel sx. Asked him to monitor and maintain a log and to discuss with his PCP if remains low or if he is sx. Adequate fluid intake.     Continue other medical care.    Discussed above findings and plan with him and he voiced understanding.  Answered all his questions.    Discussed healthy lifestyle including healthy diet, regular exercise as tolerated.  Also discussed importance of being up-to-date with age-appropriate screening tools. Colonoscopy looks like was done in 2019, but he said he would follow    Recommend follow-up with primary care physician and other specialists.    Please do not hesitate to contact us if you need further information.    Return to clinic feb  2023 or earlier if new symptoms.    [place]

## 2020-11-24 ENCOUNTER — Encounter: Admit: 2020-11-24 | Discharge: 2020-11-24 | Payer: MEDICARE | Attending: Pulmonary Disease | Primary: Family

## 2020-11-24 DIAGNOSIS — J4530 Mild persistent asthma, uncomplicated: Secondary | ICD-10-CM

## 2020-11-24 NOTE — Progress Notes (Signed)
Subjective:   CHIEF COMPLAINT / HPI:  doing well      Past Medical History:  Past Medical History:   Diagnosis Date    Arthritis     Atrial fibrillation ([place])     Bleeding ulcer 08/2018    Cancer [facility])     Prostate cancer dx March 2010- tx with radiation     Colon cancer [facility]) 2000    per old chart dx with colon ca - tx with surgery only    Diabetes mellitus ([place])     "was borderline- at one time was on Metformin for several yrs-  when I went to [facility] they had me stop this in 2017"    Fracture     "had broken neck in Nov 2016- wore a collar for 6 weeks only"    HOH (hard of hearing)     bil hearing aide    Hx of blood clots     "had blood clot - it was a  blood clot in  my esophagus "    Hx of Doppler ultrasound 09/19/2019    EF IS 55-60%  Bi atrial enlargement noted.  Moderate mitral, aortic, tricuspid and mild pulmonic regurgitation is present    Hx of fall     "last fall 2016- use cane walker and have electric chair"    Hyperlipidemia     Hypertension     follow with Dr Freeman Caldron at [place] [facility] of motion ([facility]) of shoulder     "right shoulder haved 10 % usage and left shoulder 60 % of usage"    Neuropathy     both feet    Sleep apnea     "had sleep study- tried to give me cpap but could not tolerate it "    TIA (transient ischemic attack)     'in Jan 2009- trouble with speech, numbness of lip, weakness right arm - all only lasted for 20 seconds"    Unsteady gait     "hx of back problem- was on steroids for 4 yrs- since then stability issues with my back -     Wears glasses     to read        Past Surgical History:        Procedure Laterality Date    ABDOMEN SURGERY  2000    per old chart had right hemicolectomy "removed 2 and a half feet of colon and my appendix"    COLONOSCOPY  2019    ENDOSCOPY, COLON, DIAGNOSTIC      per old chart had EGD done 08/2018 and 09/2018    HERNIA REPAIR      per old chart open left ing hernia 1980 and then recurrent left ing hernia repair 08/2018    JOINT  REPLACEMENT      per old chart total left knee 2018    PACEMAKER PLACEMENT      per old chart hx of st jude pacer insertion 2008- new pacer inserted 2019    SHOULDER SURGERY Left 2015    rota. cuff    SHOULDER SURGERY Right 02/27/2019    RIGHT REVERSE SHOULDER TOTAL ARTHROPLASTY REVERSE performed by Avel Sensor, MD at [facility] OR       Current Medications:    No current facility-administered medications for this visit.    Allergies   Allergen Reactions    Cat Hair Extract Other (See Comments)     Runny nose  Dust Mite Extract Other (See Comments)     Runny nose    Other      Other reaction(s): Other (See Comments)  Dust/cats - sneezing    Fish Allergy Nausea And Vomiting     Salmon only  Salmon only       Social History:    Social History     Socioeconomic History    Marital status: Married     Spouse name: None    Number of children: None    Years of education: None    Highest education level: None   Tobacco Use    Smoking status: Never    Smokeless tobacco: Never   Vaping Use    Vaping Use: Never used   Substance and Sexual Activity    Alcohol use: Yes     Comment: "maybe 6 times per year" "when had prostate cancer did drink one glass red wine daily in the past    Drug use: Never       Family History:   Family History   Problem Relation Age of Onset    Cancer Mother         lung cancer    Diabetes Mother     Diabetes Sister     Cancer Brother     High Blood Pressure Brother        Immunization:  Immunization History   Administered Date(s) Administered    COVID-19, MODERNA BLUE border, Primary or Immunocompromised, (age 12y+), IM, 100 mcg/0.76mL 02/19/2019, 04/03/2019, 12/18/2019, 05/22/2020    Influenza A (H1N1-09) Vaccine PF IM 01/26/2008    Influenza Virus Vaccine 11/09/2010, 11/09/2011, 11/22/2012, 10/09/2013, 10/27/2014, 02/09/2015, 10/12/2015, 10/16/2015, 10/18/2015, 02/08/2017, 10/24/2018    Influenza, FLUAD, (age 68 y+), Adjuvanted, 0.37mL 10/25/2018, 11/05/2019    Influenza, High Dose (Fluzone 65 yrs  and older) 10/17/2015, 10/20/2016, 11/07/2016    Influenza, Triv, inactivated, subunit, adjuvanted, IM (Fluad 65 yrs and older) 10/31/2017    Pneumococcal Conjugate 13-valent (Prevnar13) 11/09/2010, 07/17/2015    Pneumococcal Polysaccharide (Pneumovax23) 10/28/2013, 12/04/2014, 01/12/2017    Pneumococcal Vaccine 11/08/1997, 11/09/2010    Td vaccine (adult) 02/08/1993, 12/21/2006, 12/14/2012    Tdap (Boostrix, Adacel) 01/09/2008, 02/09/2012, 12/14/2012, 04/13/2018    Tetanus 12/21/2006    Zoster Live (Zostavax) 12/09/2005    Zoster Recombinant (Shingrix) 07/06/2017, 09/08/2017         REVIEW OF SYSTEMS:    CONSTITUTIONAL:  negative for fevers, chills, diaphoresis, activity change, appetite change, fatigue, night sweats and unexpected weight change.   EYES:  negative for blurred vision, eye discharge, visual disturbance and icterus  HEENT:  negative for hearing loss, tinnitus, ear drainage, sinus pressure, nasal congestion, epistaxis and snoring  RESPIRATORY:  See HPI  CARDIOVASCULAR:  negative for chest pain, palpitations, exertional chest pressure/discomfort, edema, syncope  GASTROINTESTINAL:  negative for nausea, vomiting, diarrhea, constipation, blood in stool and abdominal pain  GENITOURINARY:  negative for frequency, dysuria and hematuria  HEMATOLOGIC/LYMPHATIC:  negative for easy bruising, bleeding and lymphadenopathy  ALLERGIC/IMMUNOLOGIC:  negative for recurrent infections, angioedema, anaphylaxis and drug reactions  ENDOCRINE:  negative for weight changes and diabetic symptoms including polyuria, polydipsia and polyphagia    MUSCULOSKELETAL:  negative for  pain, joint swelling, decreased range of motion and muscle weakness  NEUROLOGICAL:  negative for headaches, slurred speech, unilateral weakness  PSYCHIATRIC/BEHAVIORAL: negative for hallucinations, behavioral problems, confusion and agitation.     Objective:   PHYSICAL EXAM:      VITALS:    Vitals:    11/24/20 0912  Pulse: 72   Resp: 18   SpO2: 96%    Weight: 176 lb (79.8 kg)   Height: 5\' 7"  (1.702 m)         CONSTITUTIONAL:  awake, alert, cooperative, no apparent distress, and appears stated age  NECK:  Supple, symmetrical, trachea midline, no adenopathy, thyroid symmetric, not enlarged and no tenderness  CHEST: Chest expansion equal and symmetrical, no intercostal retraction.  LUNGS:  no increased work of breathing, has expiratory wheezes both lungs, no crackles.  CARDIOVASCULAR: S1 and S2, no edema and no JVD  ABDOMEN:  normal bowel sounds, non-distended and no masses palpated, and no tenderness to palpation. No hepatospleenomegaly  LYMPHADENOPATHY:  no axillary or supraclavicular adenopathy. No cervical adnenopathy  PSYCHIATRIC: Oriented to person place and time. No obvious depression or anxiety.  MUSCULOSKELETAL: No obvious misalignment or effusion of the joints. No clubbing, cyanosis of the digits.  RIGHT AND LEFT LOWER EXTREMITIES: No edema, no inflammation, no tenderness.  SKIN:  normal skin color, texture, turgor and no redness, warmth, or swelling. No palpable nodules    DATA:                        Assessment:     Mild asthma          Plan:     D/w pt  Cpm  Rtc 6 months

## 2020-12-08 ENCOUNTER — Ambulatory Visit: Payer: MEDICARE | Attending: Cardiovascular Disease | Primary: Family

## 2020-12-08 DIAGNOSIS — Z950 Presence of cardiac pacemaker: Secondary | ICD-10-CM

## 2020-12-16 ENCOUNTER — Inpatient Hospital Stay: Payer: MEDICARE | Primary: Family

## 2020-12-16 DIAGNOSIS — D472 Monoclonal gammopathy: Secondary | ICD-10-CM

## 2020-12-16 LAB — CBC WITH AUTO DIFFERENTIAL
Basophils %: 0.9 % (ref 0–1)
Basophils Absolute: 0.1 10*3/uL
Eosinophils %: 1.2 % (ref 0–3)
Eosinophils Absolute: 0.1 10*3/uL
Hematocrit: 36 % — ABNORMAL LOW (ref 42–52)
Hemoglobin: 11.7 GM/DL — ABNORMAL LOW (ref 13.5–18.0)
Immature Neutrophil %: 1.4 % — ABNORMAL HIGH (ref 0–0.43)
Lymphocytes %: 25.1 % (ref 24–44)
Lymphocytes Absolute: 1.7 10*3/uL
MCH: 29 PG (ref 27–31)
MCHC: 32.5 % (ref 32.0–36.0)
MCV: 89.1 FL (ref 78–100)
Monocytes %: 12 % — ABNORMAL HIGH (ref 0–4)
Monocytes Absolute: 0.8 10*3/uL
Platelets: 78 10*3/uL — ABNORMAL LOW (ref 140–440)
RBC: 4.04 10*6/uL — ABNORMAL LOW (ref 4.6–6.2)
RDW: 21.1 % — ABNORMAL HIGH (ref 11.7–14.9)
Segs Absolute: 4.1 10*3/uL
Segs Relative: 59.4 % (ref 36–66)
Total Immature Neutrophil: 0.1 10*3/uL
WBC: 6.9 10*3/uL (ref 4.0–10.5)

## 2020-12-16 LAB — FERRITIN: Ferritin: 217 NG/ML (ref 30–400)

## 2021-01-22 ENCOUNTER — Encounter: Attending: Internal Medicine | Primary: Internal Medicine

## 2021-01-29 NOTE — Telephone Encounter (Signed)
Patient is having tooth repaired and needs clearance ASAP

## 2021-02-06 MED ORDER — APIXABAN 2.5 MG PO TABS
2.5 MG | ORAL_TABLET | Freq: Two times a day (BID) | ORAL | 3 refills | Status: AC
Start: 2021-02-06 — End: 2022-02-02

## 2021-02-10 ENCOUNTER — Encounter: Payer: MEDICARE | Attending: Internal Medicine | Primary: Internal Medicine

## 2021-02-12 ENCOUNTER — Encounter: Payer: MEDICARE | Attending: Internal Medicine | Primary: Internal Medicine

## 2021-02-26 LAB — COMPREHENSIVE METABOLIC PANEL
ALT: 16 U/L (ref 10–40)
AST: 22 IU/L (ref 15–37)
Albumin: 4.4 GM/DL (ref 3.4–5.0)
Alkaline Phosphatase: 71 IU/L (ref 40–128)
Anion Gap: 10 (ref 4–16)
BUN: 16 MG/DL (ref 6–23)
CO2: 24 MMOL/L (ref 21–32)
Calcium: 8.9 MG/DL (ref 8.3–10.6)
Chloride: 103 mMol/L (ref 99–110)
Creatinine: 0.9 MG/DL (ref 0.9–1.3)
Est, Glom Filt Rate: >60 mL/min/{1.73_m2} (ref >60)
Glucose: 96 MG/DL (ref 70–99)
Potassium: 4.9 MMOL/L (ref 3.5–5.1)
Sodium: 137 MMOL/L (ref 135–145)
Total Bilirubin: 0.8 MG/DL (ref 0.0–1.0)
Total Protein: 6.3 GM/DL — ABNORMAL LOW (ref 6.4–8.2)

## 2021-02-26 LAB — LIPID PANEL
Cholesterol: 80 MG/DL (ref <200)
HDL: 36 MG/DL — ABNORMAL LOW (ref >40)
LDL Calculated: 19 MG/DL (ref <100)
Triglycerides: 125 MG/DL (ref <150)

## 2021-02-26 LAB — PSA SCREENING: PSA: 1.39 NG/ML (ref 0–4.0)

## 2021-02-26 LAB — TSH: TSH, High Sensitivity: 2.77 u[IU]/mL (ref 0.270–4.20)

## 2021-02-26 LAB — HEMOGLOBIN A1C
Hemoglobin A1C: 5.8 % (ref 4.2–6.3)
eAG: 120 mg/dL

## 2021-02-26 LAB — LDL CHOLESTEROL, DIRECT: LDL Direct: 37 MG/DL (ref <100)

## 2021-03-04 NOTE — Telephone Encounter (Signed)
Wanting to hold Warfarmin 4 to 5 days. Is this allowed? If not need to do blood labs. Having extractions on 1/30. 4 total.

## 2021-03-04 NOTE — Telephone Encounter (Signed)
Letter refaxed to dentist.

## 2021-03-06 NOTE — Telephone Encounter (Signed)
Cardiologist: Dr. Izetta Dakin  Surgeon: Dr. Orma Render [facility]  Surgery: Dental Surgery  Anesthesia: Local, poss IV sedation  Date: Pending  FAX# [phone]  Ph# [phone]    Last OV 07/15/2020 w/Alam      Chronic atrial fibrillation, with history of TIA as well as hx of LV thrombus.  CHA2DS2-VASc score is > 6. on low-dose Eliquis understanding high risk of ischemic stroke versus bleeding risk. Cont BB  - No LV thrombus noted on last Echo      Anemia as well as thrombocytopenia ( Follows with Dr. Franchot Mimes) last hemoglobin 11.1.  Stable.  On iron supplements.  Can be observed on low-dose OAC.      Hyperlipidemia: Cont lipitor 40 mg daily       HTN: Cont Metoprolol. BP well controlled. Pt had episodes of hypotension, but is normotensive with meds for the most part, also rate controlled      Hyperlipidemia: Continue with Lipitor 40 mg daily     SSS s.p PPM . VVIR - Follow up with pacer clinic as outpt - Pacemaker report reviewed 06/09/20           Last EKG- 01/13/2020      Pacemaker insert- Hx      Eliquis

## 2021-03-10 ENCOUNTER — Inpatient Hospital Stay: Payer: MEDICARE | Primary: Internal Medicine

## 2021-03-10 DIAGNOSIS — D472 Monoclonal gammopathy: Secondary | ICD-10-CM

## 2021-03-10 LAB — CBC WITH AUTO DIFFERENTIAL
Basophils %: 0.7 % (ref 0–1)
Basophils Absolute: 0.1 10*3/uL
Eosinophils %: 1.3 % (ref 0–3)
Eosinophils Absolute: 0.1 10*3/uL
Hematocrit: 35.3 % — ABNORMAL LOW (ref 42–52)
Hemoglobin: 11.3 GM/DL — ABNORMAL LOW (ref 13.5–18.0)
Immature Neutrophil %: 0.9 % — ABNORMAL HIGH (ref 0–0.43)
Lymphocytes %: 27 % (ref 24–44)
Lymphocytes Absolute: 1.8 10*3/uL
MCH: 28.3 PG (ref 27–31)
MCHC: 32 % (ref 32.0–36.0)
MCV: 88.5 FL (ref 78–100)
Monocytes %: 13.8 % — ABNORMAL HIGH (ref 0–4)
Monocytes Absolute: 0.9 10*3/uL
Nucleated RBC %: 0 %
Platelets: 85 10*3/uL — ABNORMAL LOW (ref 140–440)
RBC: 3.99 10*6/uL — ABNORMAL LOW (ref 4.6–6.2)
RDW: 21.2 % — ABNORMAL HIGH (ref 11.7–14.9)
Segs Absolute: 3.8 10*3/uL
Segs Relative: 56.3 % (ref 36–66)
Total Immature Neutrophil: 0.06 10*3/uL
Total Nucleated RBC: 0 10*3/uL
WBC: 6.7 10*3/uL (ref 4.0–10.5)

## 2021-03-10 LAB — FERRITIN: Ferritin: 229 NG/ML (ref 30–400)

## 2021-03-10 LAB — COMPREHENSIVE METABOLIC PANEL
ALT: 14 U/L (ref 10–40)
AST: 18 IU/L (ref 15–37)
Albumin: 4.5 GM/DL (ref 3.4–5.0)
Alkaline Phosphatase: 76 IU/L (ref 40–129)
Anion Gap: 10 (ref 4–16)
BUN: 14 MG/DL (ref 6–23)
CO2: 24 MMOL/L (ref 21–32)
Calcium: 8.7 MG/DL (ref 8.3–10.6)
Chloride: 101 mMol/L (ref 99–110)
Creatinine: 1 MG/DL (ref 0.9–1.3)
Est, Glom Filt Rate: >60 mL/min/{1.73_m2} (ref >60)
Glucose: 118 MG/DL — ABNORMAL HIGH (ref 70–99)
Potassium: 4.5 MMOL/L (ref 3.5–5.1)
Sodium: 135 MMOL/L (ref 135–145)
Total Bilirubin: 0.7 MG/DL (ref 0.0–1.0)
Total Protein: 6.6 GM/DL (ref 6.4–8.2)

## 2021-03-10 LAB — IGM: IgM,Serum: 66 MG/DL (ref 62–277)

## 2021-03-10 LAB — VITAMIN B12: Vitamin B-12: 841.1 pg/ml (ref 211–911)

## 2021-03-10 LAB — IGG: IgG, Serum: 982 MG/DL (ref 723–1685)

## 2021-03-10 LAB — FOLATE: Folate: 10.9 NG/ML (ref 3.1–17.5)

## 2021-03-10 LAB — IGA: IgA: 118 MG/DL (ref 69–382)

## 2021-03-12 ENCOUNTER — Encounter: Payer: MEDICARE | Attending: Internal Medicine | Primary: Internal Medicine

## 2021-03-13 LAB — ELECTROPHORESIS PROTEIN, SERUM
Albumin: 4 g/dL (ref 3.75–5.01)
Alpha-1-Globulin: 0.28 g/dL (ref 0.19–0.46)
Alpha-2-Globulin: 0.65 g/dL (ref 0.48–1.05)
Beta Globulin: 0.64 g/dL (ref 0.48–1.10)
Gamma: 0.93 g/dL (ref 0.62–1.51)
Total Protein: 6.5 g/dL (ref 6.3–8.2)

## 2021-03-18 ENCOUNTER — Encounter: Payer: MEDICARE | Attending: Internal Medicine | Primary: Internal Medicine

## 2021-03-18 ENCOUNTER — Ambulatory Visit: Admit: 2021-03-18 | Discharge: 2021-03-18 | Payer: MEDICARE | Attending: Internal Medicine | Primary: Internal Medicine

## 2021-03-18 DIAGNOSIS — I480 Paroxysmal atrial fibrillation: Secondary | ICD-10-CM

## 2021-03-18 NOTE — Progress Notes (Signed)
Dalton Morin, MD                                  CARDIOLOGY  NOTE         Chief Complaint:    Chief Complaint   Patient presents with    6 Month Follow-Up     Patient has ongoing SOB, No other cardiac complaints.       No CP   Tolerating oral anticoagulation well  Denies any chest pain shortness of breath         Echo: 09/19/2019    Left ventricular function is normal, EF is estimated at 55-60%.   Mild left ventricular hypertrophy.   Diastolic dysfunction could not be evaluated due to arrhythmia.   Abnormal (paradoxical) motion consistent with RV pacemaker.   No regional wall motion abnormalities were detected.   Bi atrial enlargement noted.   Sclerotic, but non-stenotic aortic valve.   Mitral annular calcification is present.   Moderate mitral, aortic, tricuspid and mild pulmonic regurgitation is   present.   Mild pulmonary hypertension with an RVSP of 44mmHg.   Dilation of the aortic root(4.0) and the ascending aorta(4.2).   No evidence of pericardial effusion.      Prior HPI:     Dalton Warner is a 88 y.o. year old male who was seen in the hospital for chronic atrial fibrillation.  Patient has history of chronic thrombocytopenia.  He has recently moved into town to take care of his wife.  Patient also has a history of LV thrombus in the past.    Chronic atrial fibrillation  CHA2DS2-VASc score is 6     EKG: Atrial fibrillation at 66 bpm      Current Outpatient Medications   Medication Sig Dispense Refill    apixaban (ELIQUIS) 2.5 MG TABS tablet Take 1 tablet by mouth 2 times daily 180 tablet 3    ferrous gluconate 324 (37.5 Fe) MG TABS Take 1 tablet by mouth 2 times daily 60 tablet 3    ketoconazole (NIZORAL) 2 % shampoo WASH SCALP EVERY OTHER DAY LEAVE ON 3-5 MINUTES BEFORE RINSING      ketoconazole (NIZORAL) 2 % cream APPLY TOPICALLY TO FACE AND NECK EVERY DAY AS NEEDED WHILE FLARING      triamcinolone (KENALOG) 0.1 % cream       ammonium lactate (AMLACTIN) 12 % cream       cetirizine (ZYRTEC) 10 MG tablet Take  10 mg by mouth daily      pantoprazole (PROTONIX) 40 MG tablet Take 1 tablet by mouth 2 times daily (before meals) 60 tablet 2    guaiFENesin (MUCINEX) 600 MG extended release tablet Take 1,200 mg by mouth 2 times daily      alfuzosin (UROXATRAL) 10 MG extended release tablet Take 10 mg by mouth daily      atorvastatin (LIPITOR) 40 MG tablet Take 40 mg by mouth daily      metoprolol succinate (TOPROL XL) 50 MG extended release tablet Take 50 mg by mouth nightly       albuterol sulfate (PROAIR RESPICLICK) 045 (90 Base) MCG/ACT aerosol powder inhalation 2 puffs every 6 hours as needed       montelukast (SINGULAIR) 10 MG tablet 10 mg nightly        No current facility-administered medications for this visit.       Allergies:     Cat hair extract, Dust  mite extract, Other, and Fish allergy    Patient History:    Past Medical History:   Diagnosis Date    Arthritis     Atrial fibrillation ([place])     Bleeding ulcer 08/2018    Cancer [facility])     Prostate cancer dx March 2010- tx with radiation     Colon cancer [facility]) 2000    per old chart dx with colon ca - tx with surgery only    Diabetes mellitus ([place])     "was borderline- at one time was on Metformin for several yrs-  when I went to [facility] they had me stop this in 2017"    Fracture     "had broken neck in Nov 2016- wore a collar for 6 weeks only"    HOH (hard of hearing)     bil hearing aide    Hx of blood clots     "had blood clot - it was a  blood clot in  my esophagus "    Hx of Doppler ultrasound 09/19/2019    EF IS 55-60%  Bi atrial enlargement noted.  Moderate mitral, aortic, tricuspid and mild pulmonic regurgitation is present    Hx of fall     "last fall 2016- use cane walker and have electric chair"    Hyperlipidemia     Hypertension     follow with Dr Freeman Caldron at [place] range of motion (ROM) of shoulder     "right shoulder haved 10 % usage and left shoulder 60 % of usage"    Neuropathy     both feet    Sleep apnea     "had sleep study- tried  to give me cpap but could not tolerate it "    TIA (transient ischemic attack)     'in Jan 2009- trouble with speech, numbness of lip, weakness right arm - all only lasted for 20 seconds"    Unsteady gait     "hx of back problem- was on steroids for 4 yrs- since then stability issues with my back -     Wears glasses     to read      Past Surgical History:   Procedure Laterality Date    ABDOMEN SURGERY  2000    per old chart had right hemicolectomy "removed 2 and a half feet of colon and my appendix"    COLONOSCOPY  2019    ENDOSCOPY, COLON, DIAGNOSTIC      per old chart had EGD done 08/2018 and 09/2018    HERNIA REPAIR      per old chart open left ing hernia 1980 and then recurrent left ing hernia repair 08/2018    JOINT REPLACEMENT      per old chart total left knee 2018    PACEMAKER PLACEMENT      per old chart hx of st jude pacer insertion 2008- new pacer inserted 2019    SHOULDER SURGERY Left 2015    rota. cuff    SHOULDER SURGERY Right 02/27/2019    RIGHT REVERSE SHOULDER TOTAL ARTHROPLASTY REVERSE performed by Avel Sensor, MD at [facility] OR     Family History   Problem Relation Age of Onset    Cancer Mother         lung cancer    Diabetes Mother     Diabetes Sister     Cancer Brother     High Blood Pressure Brother  Social History     Tobacco Use    Smoking status: Never    Smokeless tobacco: Never   Substance Use Topics    Alcohol use: Yes     Comment: "maybe 6 times per year" "when had prostate cancer did drink one glass red wine daily in the past        Review of Systems:     Constitutional:  No Fever or Weight Loss   Eyes: No Decreased Vision  ENT: No Headaches, Hearing Loss or Vertigo  Cardiovascular: No Chest Pain,  No Shortness of breath, No Palpitations. No Edema   Respiratory: No cough or wheezing . No Respiratory distress   Gastrointestinal: No abdominal pain, appetite loss, blood in stools, constipation, diarrhea or heartburn  Genitourinary: No dysuria, trouble voiding, or  hematuria  Musculoskeletal:  denies any new  joint aches , or pain   Integumentary: No rash or pruritis  Neurological: No TIA or stroke symptoms  Psychiatric: No anxiety or depression  Endocrine: No malaise, fatigue or temperature intolerance  Hematologic/Lymphatic: No bleeding problems, blood clots or swollen lymph nodes  Allergic/Immunologic: No nasal congestion or hives        Objective:      Physical Exam:    BP 118/74 (Site: Left Upper Arm, Position: Sitting, Cuff Size: Medium Adult)    Pulse 97    Ht 5\' 7"  (1.702 m)    Wt 170 lb (77.1 kg)    SpO2 100%    BMI 26.63 kg/m??   Wt Readings from Last 3 Encounters:   03/18/21 170 lb (77.1 kg)   11/24/20 176 lb (79.8 kg)   10/21/20 176 lb 3.2 oz (79.9 kg)     Body mass index is 26.63 kg/m??.  Vitals:    03/18/21 0758   BP: 118/74   Pulse: 97   SpO2: 100%        General Appearance and Constitutional: Conversant, Well developed, Well nourished, No acute distress, Non-toxic appearance.   HEENT:  Normocephalic, Atraumatic, Bilateral external ears normal, Oropharynx moist, No oral exudates,   Nose normal.   Neck- Normal range of motion, No tenderness, Supple  Eyes:  EOMI, Conjunctiva normal, No discharge.   Respiratory:  Normal breath sounds, No respiratory distress, No wheezing, No Rales, No Ronchi.  No chest tenderness.   Cardiovascular: S1-S2 IRIR, no added heart sounds, No Mumurs appreciated. No gallops, rubs. No Pedal Edema   GI:  Bowel sounds normal, Soft, No tenderness,  GU:  No costovertebral angle tenderness   Musculoskeletal:  No gross deformities. Back- No tenderness  Integument:  Well hydrated, no rash   Lymphatic:  No lymphadenopathy noted   Neurologic:  Alert & oriented x 3, Normal motor function, normal sensory function, no focal deficits noted   Psychiatric:  Speech and behavior appropriate       Medical decision making and Data review:    DATA:    Lab Results   Component Value Date    TROPONINT <0.010 01/13/2020     BNP:    Lab Results   Component Value Date     PROBNP 2,032 (H) 01/13/2020     PT/INR:  No results found for: PTINR  Lab Results   Component Value Date    LABA1C 5.8 02/26/2021    LABA1C 6.1 09/29/2020     Lab Results   Component Value Date    CHOL 80 02/26/2021    TRIG 125 02/26/2021    HDL 36 (L) 02/26/2021  LDLCALC 19 02/26/2021    LDLDIRECT 37 02/26/2021     Lab Results   Component Value Date    WBC 6.7 03/10/2021    HGB 11.3 (L) 03/10/2021    HCT 35.3 (L) 03/10/2021    MCV 88.5 03/10/2021    PLT 85 (L) 03/10/2021     TSH: No results found for: TSH  Lab Results   Component Value Date    AST 18 03/10/2021    ALT 14 03/10/2021    BILITOT 0.7 03/10/2021    ALKPHOS 76 03/10/2021         All labs, medications and tests reviewed by myself including data and history from outside source , patient and available family .      No diagnosis found.       Impression and Plan:    Chronic atrial fibrillation, with history of TIA as well as hx of LV thrombus.  CHA2DS2-VASc score is > 6. on low-dose Eliquis understanding high risk of ischemic stroke versus bleeding risk. Cont BB  - No LV thrombus noted on last Echo     Anemia as well as thrombocytopenia ( Follows with Dr. Franchot Mimes) last hemoglobin 11.3.  Stable.  On Iron supplements.  Can be observed on low-dose OAC.     Hyperlipidemia: Cont lipitor 40 mg daily      HTN: Cont Metoprolol 50 mg po daily, BP well controlled     Hyperlipidemia: Continue with Lipitor 40 mg daily    SSS s.p PPM . VVIR - Follow up with pacer clinic as outpt - Pacemaker report reviewed 12/08/2020       Return in about 6 months (around 09/15/2021).          Counseled extensively and medication compliance urged.  We discussed that for the  prevention of ASCVD our  goal is aggressive risk modification.Patient is encouraged to exercise even a brisk walk for 30 minutes  at least 3 to 4 times a week   Various goals were discussed and questions answered. Continue current medications. Appropriate prescriptions are addressed and refills ordered.  Questions  answered and patient verbalizes understanding.  Call for any problems, questions, or concerns.

## 2021-03-18 NOTE — Patient Instructions (Signed)
**  It is YOUR responsibilty to bring medication bottles and/or updated medication list to [facility]. This will allow [place] to better serve you and all your healthcare needs**  Please be informed that if you contact our office outside of normal business hours the physician on call cannot help with any scheduling or rescheduling issues, procedure instruction questions or any type of medication issue.    We advise you for any urgent/emergency that you go to the nearest emergency room!    PLEASE CALL OUR OFFICE DURING NORMAL BUSINESS HOURS    Monday - Friday   8 am to 5 pm    [place]: [place]: [phone]    [place]:  [phone]    Thank you for allowing [place] to care for you today!   We want to ensure we can follow your treatment plan and we strive to give you the best outcomes and experience possible.   If you ever have a life threatening emergency and call 911 - for an ambulance (EMS)   Our providers can only care for you at:   [facility] or [facility].   Even if you have someone take you or you drive yourself we can only care for you in a [facility] facility. Our providers are not setup at the other healthcare locations!

## 2021-03-23 ENCOUNTER — Ambulatory Visit: Attending: Cardiovascular Disease | Primary: Internal Medicine

## 2021-03-23 DIAGNOSIS — Z950 Presence of cardiac pacemaker: Secondary | ICD-10-CM

## 2021-03-25 ENCOUNTER — Encounter: Payer: MEDICARE | Attending: Internal Medicine | Primary: Internal Medicine

## 2021-03-26 ENCOUNTER — Emergency Department: Admit: 2021-03-26 | Payer: MEDICARE | Primary: Internal Medicine

## 2021-03-26 ENCOUNTER — Encounter: Payer: MEDICARE | Attending: Internal Medicine | Primary: Internal Medicine

## 2021-03-26 ENCOUNTER — Inpatient Hospital Stay
Admit: 2021-03-26 | Discharge: 2021-03-26 | Disposition: A | Discharge status: Home / Self Care | Payer: MEDICARE | Attending: Emergency Medicine

## 2021-03-26 DIAGNOSIS — R1013 Epigastric pain: Secondary | ICD-10-CM

## 2021-03-26 DIAGNOSIS — K5730 Diverticulosis of large intestine without perforation or abscess without bleeding: Secondary | ICD-10-CM

## 2021-03-26 LAB — CBC WITH AUTO DIFFERENTIAL
Basophils %: 0.7 % (ref 0–1)
Basophils Absolute: 0.1 10*3/uL
Eosinophils %: 1.3 % (ref 0–3)
Eosinophils Absolute: 0.1 10*3/uL
Hematocrit: 35 % — ABNORMAL LOW (ref 42–52)
Hemoglobin: 11.4 GM/DL — ABNORMAL LOW (ref 13.5–18.0)
Immature Neutrophil %: 1 % — ABNORMAL HIGH (ref 0–0.43)
Lymphocytes %: 22.6 % — ABNORMAL LOW (ref 24–44)
Lymphocytes Absolute: 1.9 10*3/uL
MCH: 28.4 PG (ref 27–31)
MCHC: 32.6 % (ref 32.0–36.0)
MCV: 87.3 FL (ref 78–100)
Monocytes %: 11.7 % — ABNORMAL HIGH (ref 0–4)
Monocytes Absolute: 1 10*3/uL
Nucleated RBC %: 0 %
Platelets: 84 10*3/uL — ABNORMAL LOW (ref 140–440)
RBC: 4.01 10*6/uL — ABNORMAL LOW (ref 4.6–6.2)
RDW: 20.7 % — ABNORMAL HIGH (ref 11.7–14.9)
Segs Absolute: 5.2 10*3/uL
Segs Relative: 62.7 % (ref 36–66)
Total Immature Neutrophil: 0.08 10*3/uL
Total Nucleated RBC: 0 10*3/uL
WBC: 8.3 10*3/uL (ref 4.0–10.5)

## 2021-03-26 LAB — COMPREHENSIVE METABOLIC PANEL
ALT: 16 U/L (ref 10–40)
AST: 21 IU/L (ref 15–37)
Albumin: 4.5 GM/DL (ref 3.4–5.0)
Alkaline Phosphatase: 86 IU/L (ref 40–129)
Anion Gap: 11 (ref 4–16)
BUN: 14 MG/DL (ref 6–23)
CO2: 21 MMOL/L (ref 21–32)
Calcium: 8.7 MG/DL (ref 8.3–10.6)
Chloride: 100 mMol/L (ref 99–110)
Creatinine: 0.8 MG/DL — ABNORMAL LOW (ref 0.9–1.3)
Est, Glom Filt Rate: >60 mL/min/{1.73_m2} (ref >60)
Glucose: 101 MG/DL — ABNORMAL HIGH (ref 70–99)
Potassium: 4.4 MMOL/L (ref 3.5–5.1)
Sodium: 132 MMOL/L — ABNORMAL LOW (ref 135–145)
Total Bilirubin: 0.8 MG/DL (ref 0.0–1.0)
Total Protein: 7.1 GM/DL (ref 6.4–8.2)

## 2021-03-26 LAB — PROTIME-INR
INR: 1.21 INDEX
Protime: 15.6 SECONDS — ABNORMAL HIGH (ref 11.7–14.5)

## 2021-03-26 LAB — LIPASE: Lipase: 58 IU/L (ref 13–60)

## 2021-03-26 LAB — TROPONIN: Troponin T: <0.01 NG/ML (ref <0.01)

## 2021-03-26 LAB — MAGNESIUM: Magnesium: 2 mg/dl (ref 1.8–2.4)

## 2021-03-26 MED ORDER — SODIUM CHLORIDE 0.9 % IV BOLUS
0.9 % | Freq: Once | INTRAVENOUS | Status: AC
Start: 2021-03-26 — End: 2021-03-26
  Administered 2021-03-26: 20:00:00 1000 mL via INTRAVENOUS

## 2021-03-26 MED ORDER — NORMAL SALINE FLUSH 0.9 % IV SOLN
0.9 % | Freq: Two times a day (BID) | INTRAVENOUS | Status: DC
Start: 2021-03-26 — End: 2021-03-26

## 2021-03-26 MED ORDER — IOPAMIDOL 76 % IV SOLN
76 % | Freq: Once | INTRAVENOUS | Status: AC | PRN
Start: 2021-03-26 — End: 2021-03-26
  Administered 2021-03-26: 21:00:00 80 mL via INTRAVENOUS

## 2021-03-26 MED ORDER — PANTOPRAZOLE SODIUM 40 MG PO TBEC
40 MG | ORAL_TABLET | Freq: Two times a day (BID) | ORAL | 0 refills | Status: AC
Start: 2021-03-26 — End: ?

## 2021-03-26 MED ORDER — SODIUM CHLORIDE (PF) 0.9 % IJ SOLN
0.9 % | Freq: Once | INTRAMUSCULAR | Status: AC
Start: 2021-03-26 — End: 2021-03-26
  Administered 2021-03-26: 20:00:00 20 mg via INTRAVENOUS

## 2021-03-26 MED ORDER — PANTOPRAZOLE SODIUM 40 MG IV SOLR
40 MG | Freq: Once | INTRAVENOUS | Status: AC
Start: 2021-03-26 — End: 2021-03-26
  Administered 2021-03-26: 23:00:00 40 mg via INTRAVENOUS

## 2021-03-26 MED FILL — PROTONIX 40 MG IV SOLR: 40 MG | INTRAVENOUS | Qty: 40

## 2021-03-26 MED FILL — BD POSIFLUSH 0.9 % IV SOLN: 0.9 % | INTRAVENOUS | Qty: 40

## 2021-03-26 MED FILL — FAMOTIDINE (PF) 20 MG/2ML IV SOLN: 20 MG/2ML | INTRAVENOUS | Qty: 2

## 2021-03-26 MED FILL — ISOVUE-370 76 % IV SOLN: 76 % | INTRAVENOUS | Qty: 100

## 2021-03-26 MED FILL — SODIUM CHLORIDE 0.9 % IV SOLN: 0.9 % | INTRAVENOUS | Qty: 1000

## 2021-03-26 NOTE — ED Notes (Signed)
Pt to CT at this time.      Angus Palms, RN  03/26/21 [phone]

## 2021-03-26 NOTE — ED Provider Notes (Addendum)
[place] ENCOUNTER      Pt Name: Dalton Warner  MRN: [id number]  [place] Dec 14, 1930  Date of evaluation: 03/26/2021  Provider: Neita Carp, MD  Insurance:  Payor: MEDICARE / Plan: MEDICARE PART A AND B / Product Type: *No Product type* /   Current Code Status   Code Status: Prior     CHIEF COMPLAINT       Chief Complaint   Patient presents with    Abdominal Pain     Started at 8 am this morning         HISTORY OF PRESENT ILLNESS    HPI    Nursing Notes were reviewed.    This is a 88 y.o. male who presents to the emergency department with gastric pain since this morning.  Patient says that he has been having midepigastric pain since having breakfast this morning.  Initially, the pain waxed and waned and now it is constant.  Patient says the pain is similar to his prior episode of peptic ulcer bleeding number of years ago.    He denies nausea or vomiting.  Denies recent fevers.  He denies substernal chest pain or difficulty breathing.  He denies new cough.    PAST MEDICAL HISTORY     Past Medical History:   Diagnosis Date    Arthritis     Atrial fibrillation [facility])     Bleeding ulcer 08/2018    Cancer [facility])     Prostate cancer dx March 2010- tx with radiation     Colon cancer [facility]) 2000    per old chart dx with colon ca - tx with surgery only    Diabetes mellitus ([place])     "was borderline- at one time was on Metformin for several yrs-  when I went to [facility] they had me stop this in 2017"    Fracture     "had broken neck in Nov 2016- wore a collar for 6 weeks only"    HOH (hard of hearing)     bil hearing aide    Hx of blood clots     "had blood clot - it was a  blood clot in  my esophagus "    Hx of Doppler ultrasound 09/19/2019    EF IS 55-60%  Bi atrial enlargement noted.  Moderate mitral, aortic, tricuspid and mild pulmonic regurgitation is present    Hx of fall     "last fall 2016- use cane walker and have electric  chair"    Hyperlipidemia     Hypertension     follow with Dr Freeman Caldron at [place] range of motion ([facility]) of shoulder     "right shoulder haved 10 % usage and left shoulder 60 % of usage"    Neuropathy     both feet    Sleep apnea     "had sleep study- tried to give me cpap but could not tolerate it "    TIA (transient ischemic attack)     'in Jan 2009- trouble with speech, numbness of lip, weakness right arm - all only lasted for 20 seconds"    Unsteady gait     "hx of back problem- was on steroids for 4 yrs- since then stability issues with my back -     Wears glasses     to read          SURGICAL HISTORY  Past Surgical History:   Procedure Laterality Date    ABDOMEN SURGERY  2000    per old chart had right hemicolectomy "removed 2 and a half feet of colon and my appendix"    COLONOSCOPY  2019    ENDOSCOPY, COLON, DIAGNOSTIC      per old chart had EGD done 08/2018 and 09/2018    HERNIA REPAIR      per old chart open left ing hernia 1980 and then recurrent left ing hernia repair 08/2018    JOINT REPLACEMENT      per old chart total left knee 2018    PACEMAKER PLACEMENT      per old chart hx of st jude pacer insertion 2008- new pacer inserted 2019    SHOULDER SURGERY Left 2015    rota. cuff    SHOULDER SURGERY Right 02/27/2019    RIGHT REVERSE SHOULDER TOTAL ARTHROPLASTY REVERSE performed by Avel Sensor, MD at [place]       Previous Medications    ALBUTEROL SULFATE (PROAIR RESPICLICK) 932 (90 BASE) MCG/ACT AEROSOL POWDER INHALATION    2 puffs every 6 hours as needed     ALFUZOSIN (UROXATRAL) 10 MG EXTENDED RELEASE TABLET    Take 10 mg by mouth daily    AMMONIUM LACTATE (AMLACTIN) 12 % CREAM        APIXABAN (ELIQUIS) 2.5 MG TABS TABLET    Take 1 tablet by mouth 2 times daily    ATORVASTATIN (LIPITOR) 40 MG TABLET    Take 40 mg by mouth daily    CETIRIZINE (ZYRTEC) 10 MG TABLET    Take 10 mg by mouth daily    FERROUS GLUCONATE 324 (37.5 FE) MG TABS    Take 1 tablet by  mouth 2 times daily    GUAIFENESIN (MUCINEX) 600 MG EXTENDED RELEASE TABLET    Take 1,200 mg by mouth 2 times daily    KETOCONAZOLE (NIZORAL) 2 % CREAM    APPLY TOPICALLY TO FACE AND NECK EVERY DAY AS NEEDED WHILE FLARING    KETOCONAZOLE (NIZORAL) 2 % SHAMPOO    WASH SCALP EVERY OTHER DAY LEAVE ON 3-5 MINUTES BEFORE RINSING    METOPROLOL SUCCINATE (TOPROL XL) 50 MG EXTENDED RELEASE TABLET    Take 50 mg by mouth nightly     MONTELUKAST (SINGULAIR) 10 MG TABLET    10 mg nightly     PANTOPRAZOLE (PROTONIX) 40 MG TABLET    Take 1 tablet by mouth 2 times daily (before meals)    TRIAMCINOLONE (KENALOG) 0.1 % CREAM           ALLERGIES     Cat hair extract, Dust mite extract, Other, and Fish allergy    FAMILY HISTORY       Family History   Problem Relation Age of Onset    Cancer Mother         lung cancer    Diabetes Mother     Diabetes Sister     Cancer Brother     High Blood Pressure Brother           SOCIAL HISTORY       Social History     Socioeconomic History    Marital status: Married     Spouse name: None    Number of children: None    Years of education: None    Highest education level: None   Tobacco Use  Smoking status: Never    Smokeless tobacco: Never   Vaping Use    Vaping Use: Never used   Substance and Sexual Activity    Alcohol use: Yes     Comment: "maybe 6 times per year" "when had prostate cancer did drink one glass red wine daily in the past    Drug use: Never       PHYSICAL EXAM       ED Triage Vitals [03/26/21 1243]   BP Temp Temp Source Heart Rate Resp SpO2 Height Weight   126/70 97.5 ??F (36.4 ??C) Oral 86 18 100 % -- --       Physical Exam  Vitals and nursing note reviewed.   HENT:      Head: Normocephalic and atraumatic.   Eyes:      General: No scleral icterus.     Extraocular Movements: Extraocular movements intact.      Pupils: Pupils are equal, round, and reactive to light.   Cardiovascular:      Rate and Rhythm: Normal rate and regular rhythm.      Heart sounds: Normal heart sounds. No murmur  heard.  Pulmonary:      Effort: Pulmonary effort is normal.      Breath sounds: Normal breath sounds.   Abdominal:      Palpations: Abdomen is soft.      Tenderness: There is abdominal tenderness in the epigastric area. There is no right CVA tenderness, left CVA tenderness, guarding or rebound. Negative signs include Murphy's sign, Rovsing's sign and McBurney's sign.   Genitourinary:     Rectum: Guaiac result positive.      Comments: Trace guaiac positive  Skin:     General: Skin is warm and dry.      Capillary Refill: Capillary refill takes less than 2 seconds.      Coloration: Skin is not pale.   Neurological:      General: No focal deficit present.      Mental Status: He is alert and oriented to person, place, and time.       Vitals:    Vitals:    03/26/21 1602 03/26/21 1632 03/26/21 1702 03/26/21 1804   BP: (!) 151/87 (!) 124/113 (!) 148/89 (!) 147/93   Pulse:       Resp:       Temp:       TempSrc:       SpO2: 99% 99% 98%        DIAGNOSTIC RESULTS   ECG: Interpreted by me:  ventricular paced with episodic spontaneous premature ventricular contractions ventricular rate of 86.  QRS is 94.  QTc is 272.  There were no significant ST elevations or depressions.  Prior ECG from December 2021 shows atrial fibrillation that is no longer present.      RADIOLOGY:   Non-plain film images such as CT, Ultrasound and MRI are read by the radiologist. Plain radiographic images are visualized and preliminarily interpreted by the emergency physician with the below findings:    Interpretation per the Radiologist below, if available at the time of this note:    CT ABDOMEN PELVIS W IV CONTRAST Additional Contrast? None   Final Result   1. No acute intraabdominal process identified.   2. Colonic diverticulosis without CT evidence of diverticulitis.   3. Severe atherosclerotic disease.         XR CHEST (2 VW)   Final Result   Stable appearance of the chest without acute cardiopulmonary  process   identified.             LABS:  Labs  Reviewed   CBC WITH AUTO DIFFERENTIAL - Abnormal; Notable for the following components:       Result Value    RBC 4.01 (*)     Hemoglobin 11.4 (*)     Hematocrit 35.0 (*)     RDW 20.7 (*)     Platelets 84 (*)     Lymphocytes % 22.6 (*)     Monocytes % 11.7 (*)     Immature Neutrophil % 1.0 (*)     All other components within normal limits   COMPREHENSIVE METABOLIC PANEL - Abnormal; Notable for the following components:    Sodium 132 (*)     Creatinine 0.8 (*)     Glucose 101 (*)     All other components within normal limits   PROTIME-INR - Abnormal; Notable for the following components:    Protime 15.6 (*)     All other components within normal limits   TROPONIN   LIPASE   MAGNESIUM       All other labs were within normal range or not returned as of this dictation.    MEDICAL DECISION MAKING     Medications   sodium chloride flush 0.9 % injection 5-40 mL (has no administration in time range)   0.9 % sodium chloride bolus (1,000 mLs IntraVENous New Bag 03/26/21 1457)   famotidine (PEPCID) 20 mg in sodium chloride (PF) 0.9 % 10 mL injection (20 mg IntraVENous Given 03/26/21 1456)   iopamidol (ISOVUE-370) 76 % injection 80 mL (80 mLs IntraVENous Given 03/26/21 1625)   pantoprazole (PROTONIX) injection 40 mg (40 mg IntraVENous Given 03/26/21 1806)             Glasgow Coma Scale  Eye Opening: Spontaneous  Best Verbal Response: Oriented  Best Motor Response: Obeys commands  Glasgow Coma Scale Score: 15                     CIWA Assessment  BP: (!) 147/93  Heart Rate: 85                 MDM  This is a 88 y.o. male who presents to the emergency department with gastric pain since this morning.  Patient says that he has been having midepigastric pain since having breakfast this morning.  Initially, the pain waxed and waned and now it is constant.  Patient says the pain is similar to his prior episode of peptic ulcer bleeding number of years ago.    He denies nausea or vomiting.  Denies recent fevers.  He denies substernal chest  pain or difficulty breathing.  He denies new cough.    Patient describes nausea or vomiting but does describe some episodic watery diarrhea over the past 2 to 3 days.  He denies black/melanotic stools.    Patient is currently on Eliquis anticoagulation.  He says he is taking his medication regularly.    BUN and creatinine are normal indicating a low likelihood of acute kidney injury or renal failure.      Liver function enzymes are normal indicating a low likelihood of acute cholecystitis or hepatitis.    Lipase is normal indicating a low likelihood of acute pancreatitis.    Hemoglobin hematocrit are all normal for patient's age.    Chest x-ray was completed and shows no evidence of significant intrathoracic pathology including no evidence of pneumonia,  pneumothorax, and a low likelihood of aortic disease.    CT of the abdomen and pelvis shows no significant acute findings.  There is some evidence of diverticulosis.  There is no evidence of acute inflammatory process, pancreatitis, bowel perforation, or other acute findings.    Patient has acute abdominal pain.  He has trace guaiac positive stool but no anemia.    I discussed the patient with his gastroenterologist, Dr. Elwin Sleight, who has recommended that the patient be discharged and follow-up in his office.  Is not for the patient requires urgent endoscopy at this time and he has recommended that the patient be discharged and follow-up in his office within the next few days.  He is requested the patient contact his office for follow-up appointment.  He has requested the patient ensure that he is taking his Protonix.    Clinical Diagnoses Addressed  1. Abdominal pain, epigastric            CONSULTS:  IP CONSULT TO GI    PROCEDURES:  Unless otherwise noted below, none     Procedures      FINAL IMPRESSION      1. Abdominal pain, epigastric          DISPOSITION/PLAN   DISPOSITION Decision To Discharge 03/26/2021 06:06:32 PM      PATIENT REFERRED TO:  [name],  MD  [address]  [place] [address]  [phone]    Call in 1 day      Carrolyn Leigh, MD  [address]  [address]  [place] [address]  [phone]    Call in 1 day      DISCHARGE MEDICATIONS:  New Prescriptions    PANTOPRAZOLE (PROTONIX) 40 MG TABLET    Take 1 tablet by mouth 2 times daily (before meals)     Controlled Substances Monitoring:     No flowsheet data found.    Neita Carp, MD (electronically signed)  Attending Emergency Physician            Neita Carp, MD  03/26/21 1813       Neita Carp, MD  03/27/21 [phone]

## 2021-03-27 LAB — EKG 12-LEAD
Q-T Interval: 446 ms
QRS Duration: 140 ms
QTc Calculation (Bazett): 539 ms
R Axis: 26 degrees
T Axis: -54 degrees
Ventricular Rate: 88 {beats}/min

## 2021-04-08 NOTE — Progress Notes (Signed)
Active lab orders faxed to [facility], [place], [phone]. Confirmation rec'd.

## 2021-04-21 ENCOUNTER — Encounter: Payer: MEDICARE | Attending: Internal Medicine | Primary: Internal Medicine

## 2021-04-21 ENCOUNTER — Encounter: Payer: MEDICARE | Primary: Internal Medicine

## 2021-04-24 ENCOUNTER — Ambulatory Visit: Admit: 2021-04-24 | Discharge: 2021-04-24 | Payer: MEDICARE | Attending: Internal Medicine | Primary: Internal Medicine

## 2021-04-24 ENCOUNTER — Inpatient Hospital Stay: Admit: 2021-04-24 | Discharge: 2021-04-24 | Payer: MEDICARE | Primary: Internal Medicine

## 2021-04-24 DIAGNOSIS — D472 Monoclonal gammopathy: Secondary | ICD-10-CM

## 2021-04-24 NOTE — Progress Notes (Signed)
MA Rooming Questions  Patient: Dalton Warner  MRN: [id number]    Date: 04/24/2021        1. Do you have any new issues?   yes - was having severe abdominal pain which prompted ER visit @ [place] and [place]         2. Do you need any refills on medications?    no    3. Have you had any imaging done since your last visit?   yes -     4. Have you been hospitalized or seen in the emergency room since your last visit here?   yes - ED    5. Did the patient have a depression screening completed today? no    No data recorded     PHQ-9 Given to (if applicable):               PHQ-9 Score (if applicable):                     '[]'$  Positive     '[]'$   Negative              Does question #9 need addressed (if applicable)                     '[]'$  Yes    '[]'$   No               Forest Becker, CMA

## 2021-04-24 NOTE — Progress Notes (Signed)
Patient Name:  Dalton Warner  Patient DOB:  January 15, 1931  Patient MRN:  [id number]     Primary Oncologist: Charisse Klinefelter, MD  Referring Provider: [name], MD     Date of Service: 04/24/2021      Reason for Consult:  Thrombocytopenia     Chief Complaint:    Chief Complaint   Patient presents with    Follow-up         Encounter Diagnoses   Name Primary?    Monoclonal gammopathy Yes    Anemia, unspecified type     Thrombocytopenia (HCC)           HPI:   11/22/18: Arrived alone to the clinic today. Loves to talk.  According to Dr. Hortense Ramal his oncologist at [facility] he was diagnosed with prostate cancer in 2010.  Received external beam radiation therapy.  He has been on Xarelto since 2017 for A. fib.  Prior to which he was on Coumadin since 2008.  Further work-up was negative for hepatitis B, hepatitis C and HIV.  No evidence of hemolysis.  B12 was on lower side.  Folate was normal.  Iron studies were normal.  Serum protein electrophoresis was normal.  But imaging of fixation revealed a small IgG lambda monoclonal band.  Was advised to be on B12 supplements.  Today at the office he reported that he had hernia surgery on July 24 when he received platelet transfusion.  Reported that his been caregiver to his wife for 20 years and is currently at [facility] home at the other mass unit.  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  Married for 67 years.  Lost a lot of weight in the process of taking care of his wife.  Reported tiredness.  No bleeding or any rash.  No chest pain.  Reported arthritis pain all the time.  Reported that he is taking oral B12 supplements thousand micrograms daily.    11/02/2016: OAC:[id number] review demonstrates a bone marrow that is approximately 30% cellular with trilineage hematopoiesis and 1-2% blasts based on the manual differential count, as confirmed by immunophenotypic studies. There are mild myeloid dyspoietic changes as well as  minimal erythroid atypia. Although reactive etiologies including medications, toxins and nutritional deficiencies must be excluded, the patient's cytopenias and bone marrow morphologic findings raise the possibility of an underlying myelodysplastic syndrome.  FISH/Cytogenetics: Normal    07/20/2018 CBC with WBC of 6.52 hemoglobin of 11.0 hematocrit 34.7 MCV of 93.3 platelets of 78 diff within normal limits    March 2020  platelet count was 81.    Jan 2020  it was 85K    09/2018: CT chest:1. Ectasia of the ascending thoracic aorta measuring 4.2 cm in short-axis diameter.  This may be due to aortic stenosis considering the aortic valve calcifications present.  2. No evidence of aortic dissection.  3. Atherosclerotic calcification of the 3 major native coronary arteries.  4. Moderate dilation of cardiac chambers.  5. Evidence of prior granulomatous disease with no definite pneumonia or suspicious-appearing pulmonary nodules.  6. Trace right pleural effusion.  7. Mild fatty infiltration of the liver.  8. Small sliding-type hiatal hernia.    09/08/18: Ultrasound:1. Mild ascites evident in the left upper quadrant.  2. Nonvisualized pancreas and majority of the abdominal aorta due to overlying bowel gas.  3. Otherwise unremarkable study including no evidence of cirrhosis and normal splenic size measuring 9.6 cm.    12/29/18:  Ct shoulder:  1. Interstitial tearing of the subscapularis, supraspinatus, and   infraspinatus tendons with full-thickness tearing predominantly along the   conjoined fibers of the supraspinatus and infraspinatus tendons which   measures approximately 1.0 x 1.4 cm.  There is also full-thickness   perforation of the anterior fibers of the supraspinatus tendon.  Underlying   diffuse mild atrophy of the rotator cuff musculature.   2. Severe advanced osteoarthritis of the glenohumeral joint with underlying   diffuse complex labral tearing.   3. Severe osteoarthritis of the acromioclavicular joint.   4.  Synovitis.     Feb 27 2019:Right rotator cuff surgery. Lost lot of blood and received 3 units of PRBCs.     Feb 27 2019:  Unremarkable appearance of reverse glenohumeral arthroplasty.       AC joint osteoarthritis.         01/13/20:Presented to [facility] with sx of fatigue, Acute bronchitis, reflux sx, hans and fett feeling cold. generalized arthritis pains. No overt bleeding. No bruising. Palpitations. No pain, increased sob. No dizziness.      Feb 2022 CXR normal     03/10/2021 vitamin B12 841.1  CBC WBC 6.7 hemoglobin 11.3 hematocrit 35.3 MCV 88.5 platelets 85  CMP basically WNL  Ferritin 229  Folate 10.9  IgA 118  IgG 982  IgM 66    Electrophoresis protein: restricted band of protein migration in the gamma region which is too small to quantify. IFE gel shows a faint band in lambda which may be indicative of a specific immune response or an early monoclonal protein.     03/26/2021 CT abdomen pelvis  Impression   1. No acute intraabdominal process identified.   2. Colonic diverticulosis without CT evidence of diverticulitis.   3. Severe atherosclerotic disease.   CBC WBC 8.3 hemoglobin 11.4 hematocrit 35.0 MCV 87.3 platelets 84  CMP BUN 14 creatinine 0.8  Lipase 58  Magnesium 2.0     Past Medical History:     A. fib, osteoarthritis, borderline diabetes.  Seasonal allergies, prostate cancer hypertension.                                                           Past Surgery History:      Basal cell skin cancer removal, cardiac pacemaker, colectomy for diverticular disease, hernia repair, left knee replacement, appendectomy                                                              Social History:   Lives alone.  Has 3 children, they live in [place] in [place].  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  Denies any smoking history or any other illicit drug abuse.  Family History:    Mother was a non-smoker was  diagnosed with lung cancer.  Younger brother with colon cancer.  His sister was diagnosed with uterine, ovarian cancer at the age of 50.                                                                                           Allergies   Allergen Reactions    Cat Hair Extract Other (See Comments)     Runny nose    Dust Mite Extract Other (See Comments)     Runny nose    Other      Other reaction(s): Other (See Comments)  Dust/cats - sneezing    Fish Allergy Nausea And Vomiting     Salmon only  Salmon only       Current Outpatient Medications on File Prior to Visit   Medication Sig Dispense Refill    pantoprazole (PROTONIX) 40 MG tablet Take 1 tablet by mouth 2 times daily (before meals) 60 tablet 0    apixaban (ELIQUIS) 2.5 MG TABS tablet Take 1 tablet by mouth 2 times daily 180 tablet 3    ketoconazole (NIZORAL) 2 % shampoo WASH SCALP EVERY OTHER DAY LEAVE ON 3-5 MINUTES BEFORE RINSING      ketoconazole (NIZORAL) 2 % cream APPLY TOPICALLY TO FACE AND NECK EVERY DAY AS NEEDED WHILE FLARING      triamcinolone (KENALOG) 0.1 % cream       ammonium lactate (AMLACTIN) 12 % cream       cetirizine (ZYRTEC) 10 MG tablet Take 10 mg by mouth daily      pantoprazole (PROTONIX) 40 MG tablet Take 1 tablet by mouth 2 times daily (before meals) 60 tablet 2    guaiFENesin (MUCINEX) 600 MG extended release tablet Take 1,200 mg by mouth 2 times daily      alfuzosin (UROXATRAL) 10 MG extended release tablet Take 10 mg by mouth daily      atorvastatin (LIPITOR) 40 MG tablet Take 40 mg by mouth daily      metoprolol succinate (TOPROL XL) 50 MG extended release tablet Take 50 mg by mouth nightly       albuterol sulfate (PROAIR RESPICLICK) 166 (90 Base) MCG/ACT aerosol powder inhalation 2 puffs every 6 hours as needed       montelukast (SINGULAIR) 10 MG tablet 10 mg nightly       ferrous gluconate 324 (37.5 Fe) MG TABS Take 1 tablet by mouth 2 times daily 60 tablet 3     No current facility-administered medications on file prior to  visit.     Interval history:04/24/21: Here to clinic alone using walker. Reported ABD Pain about 2 months ago. Presented to [facility].  Looks like had EGD. No N/V. Reports Intermittent mild constipation. No Bleeding. NO GU Symptoms. No Fever. No CP.  Having Dry Cough.     Review of Systems:    As per the interval history, otherwise rest of the ros negative     Vital Signs: BP (!) 112/59 (Site: Left Upper Arm, Position: Sitting, Cuff Size: Medium Adult)    Pulse 62  Temp 97.9 ??F (36.6 ??C) (Temporal)    Resp 16    Ht 5' 7"  (1.702 m)    Wt 174 lb 6.4 oz (79.1 kg)    SpO2 97%    BMI 27.31 kg/m??      Physical Exam:  CONSTITUTIONAL: awake, alert ,Uses walker for ambulation.   EYES:PEAR, no palor or any icetrus  OZD:GUYQ  NECK: No JVD  HEMATOLOGIC/LYMPHATIC: no cervical, supraclavicular or axillary lymphadenopathy   LUNGS: coarse bs left side   CARDIOVASCULAR: s1s2 irr irr ESM  ABDOMEN: soft ntnd bs pos  NEUROLOGIC: GI  SKIN: No rash  EXTREMITIES: no LE edema bilaterally.      Labs:  Hematology:  Lab Results   Component Value Date    WBC 8.3 03/26/2021    RBC 4.01 (L) 03/26/2021    HGB 11.4 (L) 03/26/2021    HCT 35.0 (L) 03/26/2021    MCV 87.3 03/26/2021    MCH 28.4 03/26/2021    MCHC 32.6 03/26/2021    RDW 20.7 (H) 03/26/2021    PLT 84 (L) 03/26/2021    MPV 10.3 07/10/2020    BANDSPCT 4 (L) 02/28/2019    SEGSPCT 62.7 03/26/2021    EOSRELPCT 1.3 03/26/2021    BASOPCT 0.7 03/26/2021    LYMPHOPCT 22.6 (L) 03/26/2021    MONOPCT 11.7 (H) 03/26/2021    BANDABS 0.50 02/28/2019    SEGSABS 5.2 03/26/2021    EOSABS 0.1 03/26/2021    BASOSABS 0.1 03/26/2021    LYMPHSABS 1.9 03/26/2021    MONOSABS 1.0 03/26/2021    DIFFTYPE AUTOMATED DIFFERENTIAL 03/26/2021    ANISOCYTOSIS 1+ 01/13/2020    WBCMORP OCCASIONAL 02/28/2019     Lab Results   Component Value Date    ESR 5 02/20/2019     Chemistry:  Lab Results   Component Value Date    NA 132 (L) 03/26/2021    K 4.4 03/26/2021    CL 100 03/26/2021    CO2 21 03/26/2021    BUN 14 03/26/2021     CREATININE 0.8 (L) 03/26/2021    GLUCOSE 101 (H) 03/26/2021    CALCIUM 8.7 03/26/2021    PROT 7.1 03/26/2021    LABALBU 4.5 03/26/2021    BILITOT 0.8 03/26/2021    ALKPHOS 86 03/26/2021    AST 21 03/26/2021    ALT 16 03/26/2021    LABGLOM >60 03/26/2021    GFRAA >60 09/29/2020    GFRAA >60 09/29/2020    MG 2.0 03/26/2021    POCGLU 99 02/27/2019     Lab Results   Component Value Date    LDH 170 05/24/2019     No results found for: LD  Lab Results   Component Value Date    TSHHS 2.770 02/26/2021     Immunology:  Lab Results   Component Value Date    PROT 7.1 03/26/2021    SPEP  04/01/2020     INTERPRETATION - A faint free lambda light chains monoclonal band is detected. SAF    SPEP  04/01/2020     INTERPRETATION - Concurrent immunofixation detected a faint free lambda light chains monoclonal band, which is too small to measure. SAF    ALBUMINELP 3.6 04/01/2020    LABALPH 0.28 03/10/2021    LABALPH 0.65 03/10/2021    LABBETA 0.64 03/10/2021    GAMGLOB 0.9 04/01/2020     No results found for: KAPPAUVOL, LAMBDAUVOL, KLFLCR  No results found for: B2M  Coagulation Panel:  Lab Results  Component Value Date    PROTIME 15.6 (H) 03/26/2021    INR 1.21 03/26/2021    APTT 46.4 (H) 03/03/2019     Anemia Panel:  Lab Results   Component Value Date    VITAMINB12 841.1 03/10/2021    FOLATE 10.9 03/10/2021     Tumor Markers:  Lab Results   Component Value Date    PSA 1.39 02/26/2021        Observations:  ECOG:  No data recorded       Assessment & Plan:                                                          Chronic thrombocytopenia: Etiology could be secondary to underlying MDS vs ITP.  Cytogenetics in the past were normal, blast being 2%, his IPSS score was about 1.5 which is a low score. No evidence of hemolysis, hepatitis, HIV in the past.  Feb 2023 platelet count was 84K  Continue to monitor for now and discussed repeat BMB/aspiration for further evaluation and treatment plan if counts declining.  Discussed the role of TPO agent  in elective surgery.    Normocytic anemia: Could be sec to early MDS vs mild iron def vs b12 def . No hemolysis.  Hb 11.4, ferritin 229, b12 841. Discontinue oral iron . May continue oral b12. As mentioned above, consider further evaluation with BMB if anemia worsening.     Monoclonal gammopathy noted to have small IgG lambda band on the immunofixation but normal serum protein electrophoresis.  Normal renal function normal calcium levels.  Serum immunoglobulins and serum free light chains were normal.  No evidence of any myeloma defining disease other than anemia which is probably not sec to that.  Repeat panel in feb 2022 with very faint M protein too small to measure and the same as in feb 2023. Will monitor yearly    Paroxysmal atrial fibrillation.  Has pacemaker.  Is on Eliquis 2.5 mg po bid.   Continue as long as a platelet count of more than 60 K.  Avoid falls, deep cuts and monitor closely for any bleeding.     H/o Colon cancer: s/p resection in 2000, did not receive any adjuvant treatment. Also Family history of colon cancer.  Reported that he is up-to-date with the colonoscopy.    H/O Prostate cancer: Diagnosed in 2010. S/P XRT. Is being followed by Urology.     GERD: Pharmacological intervention not really helping but restricting diet has been helping. Has been tried on dexlansoprazole as well. Has been evaluated by GI    Family history ovarian cancer.  No genetic testing has been done and we discussed about that. He defered genetic testing at this point    Hypotension; Does note feel sx. Asked him to monitor and maintain a log and to discuss with his PCP if remains low or if he is sx. Adequate fluid intake.     Continue other medical care.    Discussed above findings and plan with him and he voiced understanding.  Answered all his questions.    Discussed healthy lifestyle including healthy diet, regular exercise as tolerated.  Also discussed importance of being up-to-date with age-appropriate screening  tools. Colonoscopy looks like was done in 2019, but he said he would follow  Recommend follow-up with primary care physician and other specialists.    Please do not hesitate to contact us if you need further information.    Return to clinic Sept 2023 or earlier if new symptoms.    Scribe Authentication Statement  Wylene Men scribed portions of this documentation for and in the presence of Charisse Klinefelter, MD on 04/24/21 at 11:49 AM EDT    Provider Authentication Statement  I, Charisse Klinefelter, M.D., personally performed the services described in this documentation and they were scribed in my presence by Wylene Men, MA.   The documentation is both accurate and complete.     Charisse Klinefelter, MD     This note is created with the assistance of a speech-recognition program. While intending to generate a document that accurately reflects the content of the visit, no guarantee can be provided that every mistake has been identified and corrected by editing.    Gamaliel

## 2021-05-25 ENCOUNTER — Ambulatory Visit: Admit: 2021-05-25 | Discharge: 2021-05-25 | Payer: MEDICARE | Attending: Pulmonary Disease | Primary: Family

## 2021-05-25 DIAGNOSIS — J4530 Mild persistent asthma, uncomplicated: Secondary | ICD-10-CM

## 2021-05-25 NOTE — Progress Notes (Signed)
Subjective:   CHIEF COMPLAINT / HPI:  doing well      Past Medical History:  Past Medical History:   Diagnosis Date    Arthritis     Atrial fibrillation ([place])     Bleeding ulcer 08/2018    Cancer [facility])     Prostate cancer dx March 2010- tx with radiation     Colon cancer [facility]) 2000    per old chart dx with colon ca - tx with surgery only    Diabetes mellitus ([place])     "was borderline- at one time was on Metformin for several yrs-  when I went to [facility] they had me stop this in 2017"    Fracture     "had broken neck in Nov 2016- wore a collar for 6 weeks only"    HOH (hard of hearing)     bil hearing aide    Hx of blood clots     "had blood clot - it was a  blood clot in  my esophagus "    Hx of Doppler ultrasound 09/19/2019    EF IS 55-60%  Bi atrial enlargement noted.  Moderate mitral, aortic, tricuspid and mild pulmonic regurgitation is present    Hx of fall     "last fall 2016- use cane walker and have electric chair"    Hyperlipidemia     Hypertension     follow with Dr Freeman Caldron at [place] [facility] of motion ([facility]) of shoulder     "right shoulder haved 10 % usage and left shoulder 60 % of usage"    Neuropathy     both feet    Sleep apnea     "had sleep study- tried to give me cpap but could not tolerate it "    TIA (transient ischemic attack)     'in Jan 2009- trouble with speech, numbness of lip, weakness right arm - all only lasted for 20 seconds"    Unsteady gait     "hx of back problem- was on steroids for 4 yrs- since then stability issues with my back -     Wears glasses     to read        Past Surgical History:        Procedure Laterality Date    ABDOMEN SURGERY  2000    per old chart had right hemicolectomy "removed 2 and a half feet of colon and my appendix"    COLONOSCOPY  2019    ENDOSCOPY, COLON, DIAGNOSTIC      per old chart had EGD done 08/2018 and 09/2018    HERNIA REPAIR      per old chart open left ing hernia 1980 and then recurrent left ing hernia repair 08/2018    JOINT  REPLACEMENT      per old chart total left knee 2018    PACEMAKER PLACEMENT      per old chart hx of st jude pacer insertion 2008- new pacer inserted 2019    SHOULDER SURGERY Left 2015    rota. cuff    SHOULDER SURGERY Right 02/27/2019    RIGHT REVERSE SHOULDER TOTAL ARTHROPLASTY REVERSE performed by Avel Sensor, MD at [facility] OR       Current Medications:    No current facility-administered medications for this visit.    Allergies   Allergen Reactions    Cat Hair Extract Other (See Comments)     Runny nose  Dust Mite Extract Other (See Comments)     Runny nose    Other      Other reaction(s): Other (See Comments)  Dust/cats - sneezing    Fish Allergy Nausea And Vomiting     Salmon only  Salmon only       Social History:    Social History     Socioeconomic History    Marital status: Married     Spouse name: None    Number of children: None    Years of education: None    Highest education level: None   Tobacco Use    Smoking status: Never    Smokeless tobacco: Never   Vaping Use    Vaping Use: Never used   Substance and Sexual Activity    Alcohol use: Yes     Comment: "maybe 6 times per year" "when had prostate cancer did drink one glass red wine daily in the past    Drug use: Never       Family History:   Family History   Problem Relation Age of Onset    Cancer Mother         lung cancer    Diabetes Mother     Diabetes Sister     Cancer Brother     High Blood Pressure Brother        Immunization:  Immunization History   Administered Date(s) Administered    COVID-19, MODERNA BLUE border, Primary or Immunocompromised, (age 12y+), IM, 100 mcg/0.71m 02/19/2019, 04/03/2019, 12/18/2019, 05/22/2020    Influenza A (H1N1-09) Vaccine PF IM 01/26/2008    Influenza Virus Vaccine 11/09/2010, 11/09/2011, 11/22/2012, 10/09/2013, 10/27/2014, 02/09/2015, 10/12/2015, 10/16/2015, 10/18/2015, 02/08/2017, 10/24/2018, 10/07/2020    Influenza, FLUAD, (age 6335y+), Adjuvanted, 0.520m09/16/2020, 11/05/2019    Influenza, High Dose  (Fluzone 65 yrs and older) 10/17/2015, 10/20/2016, 11/07/2016    Influenza, Triv, inactivated, subunit, adjuvanted, IM (Fluad 65 yrs and older) 10/31/2017    Pneumococcal Vaccine 11/08/1997, 11/09/2010    Pneumococcal, PCV-13, PREVNAR 1382(age 63w+), IM, 0.52m32m0/02/2010, 07/17/2015    Pneumococcal, PPSV23, PNEUMOVAX 23, (age 2y+), SC/IM, 0.52mL109m/20/2015, 12/04/2014, 01/12/2017    TDaP, ADACEL (age 10y-82y-64yOOSTRIX (age 10y+), IM, 0.52mL 37m01/2009, 02/09/2012, 12/14/2012, 04/13/2018    Td vaccine (adult) 02/08/1993, 12/21/2006, 12/14/2012    Tetanus 12/21/2006    Zoster Live (Zostavax) 12/09/2005    Zoster Recombinant (Shingrix) 07/06/2017, 09/08/2017         REVIEW OF SYSTEMS:    CONSTITUTIONAL:  negative for fevers, chills, diaphoresis, activity change, appetite change, fatigue, night sweats and unexpected weight change.   EYES:  negative for blurred vision, eye discharge, visual disturbance and icterus  HEENT:  negative for hearing loss, tinnitus, ear drainage, sinus pressure, nasal congestion, epistaxis and snoring  RESPIRATORY:  See HPI  CARDIOVASCULAR:  negative for chest pain, palpitations, exertional chest pressure/discomfort, edema, syncope  GASTROINTESTINAL:  negative for nausea, vomiting, diarrhea, constipation, blood in stool and abdominal pain  GENITOURINARY:  negative for frequency, dysuria and hematuria  HEMATOLOGIC/LYMPHATIC:  negative for easy bruising, bleeding and lymphadenopathy  ALLERGIC/IMMUNOLOGIC:  negative for recurrent infections, angioedema, anaphylaxis and drug reactions  ENDOCRINE:  negative for weight changes and diabetic symptoms including polyuria, polydipsia and polyphagia    MUSCULOSKELETAL:  negative for  pain, joint swelling, decreased range of motion and muscle weakness  NEUROLOGICAL:  negative for headaches, slurred speech, unilateral weakness  PSYCHIATRIC/BEHAVIORAL: negative for hallucinations, behavioral problems, confusion and agitation.     Objective:   PHYSICAL  EXAM:       VITALS:    Vitals:    05/25/21 0908   Pulse: 72   Resp: 18   SpO2: 96%   Weight: 174 lb (78.9 kg)   Height: '5\' 7"'$  (1.702 m)         CONSTITUTIONAL:  awake, alert, cooperative, no apparent distress, and appears stated age  NECK:  Supple, symmetrical, trachea midline, no adenopathy, thyroid symmetric, not enlarged and no tenderness  CHEST: Chest expansion equal and symmetrical, no intercostal retraction.  LUNGS:  no increased work of breathing, has expiratory wheezes both lungs, no crackles.  CARDIOVASCULAR: S1 and S2, no edema and no JVD  ABDOMEN:  normal bowel sounds, non-distended and no masses palpated, and no tenderness to palpation. No hepatospleenomegaly  LYMPHADENOPATHY:  no axillary or supraclavicular adenopathy. No cervical adnenopathy  PSYCHIATRIC: Oriented to person place and time. No obvious depression or anxiety.  MUSCULOSKELETAL: No obvious misalignment or effusion of the joints. No clubbing, cyanosis of the digits.  RIGHT AND LEFT LOWER EXTREMITIES: No edema, no inflammation, no tenderness.  SKIN:  normal skin color, texture, turgor and no redness, warmth, or swelling. No palpable nodules    DATA:                        Assessment:     Mild asthma          Plan:     D/w pt  Cpm  No refill needed  Rtc 6 months

## 2021-06-08 ENCOUNTER — Ambulatory Visit: Attending: Cardiovascular Disease | Primary: Internal Medicine

## 2021-06-08 DIAGNOSIS — Z950 Presence of cardiac pacemaker: Secondary | ICD-10-CM

## 2021-07-23 ENCOUNTER — Inpatient Hospital Stay: Payer: MEDICARE | Primary: Internal Medicine

## 2021-07-23 DIAGNOSIS — D472 Monoclonal gammopathy: Secondary | ICD-10-CM

## 2021-07-23 LAB — COMPREHENSIVE METABOLIC PANEL
ALT: 17 U/L (ref 10–40)
AST: 17 IU/L (ref 15–37)
Albumin: 4.5 GM/DL (ref 3.4–5.0)
Alkaline Phosphatase: 73 IU/L (ref 40–129)
Anion Gap: 13 (ref 4–16)
BUN: 12 MG/DL (ref 6–23)
CO2: 22 MMOL/L (ref 21–32)
Calcium: 8.5 MG/DL (ref 8.3–10.6)
Chloride: 101 mMol/L (ref 99–110)
Creatinine: 0.9 MG/DL (ref 0.9–1.3)
Est, Glom Filt Rate: >60 mL/min/{1.73_m2} (ref >60)
Glucose: 140 MG/DL — ABNORMAL HIGH (ref 70–99)
Potassium: 4.8 MMOL/L (ref 3.5–5.1)
Sodium: 136 MMOL/L (ref 135–145)
Total Bilirubin: 0.8 MG/DL (ref 0.0–1.0)
Total Protein: 6.5 GM/DL (ref 6.4–8.2)

## 2021-07-23 LAB — CBC WITH AUTO DIFFERENTIAL
Basophils %: 0.8 % (ref 0–1)
Basophils Absolute: 0.1 10*3/uL
Eosinophils %: 1.6 % (ref 0–3)
Eosinophils Absolute: 0.1 10*3/uL
Hematocrit: 35.5 % — ABNORMAL LOW (ref 42–52)
Hemoglobin: 11.1 GM/DL — ABNORMAL LOW (ref 13.5–18.0)
Immature Neutrophil %: 2 % — ABNORMAL HIGH (ref 0–0.43)
Lymphocytes %: 20 % — ABNORMAL LOW (ref 24–44)
Lymphocytes Absolute: 1.7 10*3/uL
MCH: 27.4 PG (ref 27–31)
MCHC: 31.3 % — ABNORMAL LOW (ref 32.0–36.0)
MCV: 87.7 FL (ref 78–100)
MPV: 10.9 FL (ref 7.5–11.1)
Monocytes %: 12.8 % — ABNORMAL HIGH (ref 0–4)
Monocytes Absolute: 1.1 10*3/uL
Nucleated RBC %: 0 %
Platelets: 93 10*3/uL — ABNORMAL LOW (ref 140–440)
RBC: 4.05 10*6/uL — ABNORMAL LOW (ref 4.6–6.2)
RDW: 20.8 % — ABNORMAL HIGH (ref 11.7–14.9)
Segs Absolute: 5.4 10*3/uL
Segs Relative: 62.8 % (ref 36–66)
Total Immature Neutrophil: 0.17 10*3/uL
Total Nucleated RBC: 0 10*3/uL
WBC: 8.6 10*3/uL (ref 4.0–10.5)

## 2021-07-23 LAB — IRON AND TIBC
Iron: 93 ug/dL (ref 59–158)
TIBC: 273 ug/dL (ref 250–450)
Transferrin %: 34 % (ref 10–44)
UIBC: 180 ug/dL (ref 110–370)

## 2021-07-23 LAB — FERRITIN: Ferritin: 229 NG/ML (ref 30–400)

## 2021-07-23 LAB — VITAMIN B12: Vitamin B-12: 906.4 pg/ml (ref 211–911)

## 2021-07-23 LAB — FOLATE: Folate: 8.9 NG/ML (ref 3.1–17.5)

## 2021-07-26 LAB — ELECTROPHORESIS PROTEIN, SERUM
Albumin: 4.17 g/dL (ref 3.75–5.01)
Alpha-1-Globulin: 0.28 g/dL (ref 0.19–0.46)
Alpha-2-Globulin: 0.67 g/dL (ref 0.48–1.05)
Beta Globulin: 0.65 g/dL (ref 0.48–1.10)
Gamma: 0.83 g/dL (ref 0.62–1.51)
Total Protein: 6.6 g/dL (ref 6.3–8.2)

## 2021-07-26 LAB — IMMUNOFIXATION SERUM PROFILE

## 2021-09-07 ENCOUNTER — Ambulatory Visit: Attending: Cardiovascular Disease | Primary: Internal Medicine

## 2021-09-07 DIAGNOSIS — Z950 Presence of cardiac pacemaker: Secondary | ICD-10-CM

## 2021-09-29 ENCOUNTER — Ambulatory Visit: Admit: 2021-09-29 | Discharge: 2021-09-29 | Payer: MEDICARE | Attending: Internal Medicine | Primary: Family

## 2021-09-29 DIAGNOSIS — I480 Paroxysmal atrial fibrillation: Secondary | ICD-10-CM

## 2021-09-29 NOTE — Progress Notes (Signed)
Dalton Morin, MD                                  CARDIOLOGY  NOTE         Chief Complaint:    Chief Complaint   Patient presents with    6 Month Follow-Up     6 mo follow up. No complaints of cardiac sx. States he has leg pain.  Ascvd - has questions.      No CP   Tolerating oral anticoagulation well  Denies any chest pain shortness of breath         Echo: 09/19/2019    Left ventricular function is normal, EF is estimated at 55-60%.   Mild left ventricular hypertrophy.   Diastolic dysfunction could not be evaluated due to arrhythmia.   Abnormal (paradoxical) motion consistent with RV pacemaker.   No regional wall motion abnormalities were detected.   Bi atrial enlargement noted.   Sclerotic, but non-stenotic aortic valve.   Mitral annular calcification is present.   Moderate mitral, aortic, tricuspid and mild pulmonic regurgitation is   present.   Mild pulmonary hypertension with an RVSP of 95mHg.   Dilation of the aortic root(4.0) and the ascending aorta(4.2).   No evidence of pericardial effusion.      Prior HPI:     Dalton Warner a 88y.o. year old male who was seen in the hospital for chronic atrial fibrillation.  Patient has history of chronic thrombocytopenia.  He has recently moved into town to take care of his wife.  Patient also has a history of LV thrombus in the past.    Chronic atrial fibrillation  CHA2DS2-VASc score is 6     EKG: Atrial fibrillation at 66 bpm      Current Outpatient Medications   Medication Sig Dispense Refill    vitamin B-12 (CYANOCOBALAMIN) 1000 MCG tablet Take 1 tablet by mouth daily      apixaban (ELIQUIS) 2.5 MG TABS tablet Take 1 tablet by mouth 2 times daily 180 tablet 3    ketoconazole (NIZORAL) 2 % shampoo WASH SCALP EVERY OTHER DAY LEAVE ON 3-5 MINUTES BEFORE RINSING      ketoconazole (NIZORAL) 2 % cream APPLY TOPICALLY TO FACE AND NECK EVERY DAY AS NEEDED WHILE FLARING      ammonium lactate (AMLACTIN) 12 % cream       cetirizine (ZYRTEC) 10 MG tablet Take 1 tablet by mouth  daily      pantoprazole (PROTONIX) 40 MG tablet Take 1 tablet by mouth 2 times daily (before meals) 60 tablet 2    guaiFENesin (MUCINEX) 600 MG extended release tablet Take 2 tablets by mouth 2 times daily      atorvastatin (LIPITOR) 40 MG tablet Take 1 tablet by mouth daily      metoprolol succinate (TOPROL XL) 50 MG extended release tablet Take 1 tablet by mouth nightly      albuterol sulfate (PROAIR RESPICLICK) 1960(90 Base) MCG/ACT aerosol powder inhalation 2 puffs every 6 hours as needed      montelukast (SINGULAIR) 10 MG tablet 1 tablet nightly      pantoprazole (PROTONIX) 40 MG tablet Take 1 tablet by mouth 2 times daily (before meals) (Patient not taking: Reported on 09/29/2021) 60 tablet 0    ferrous gluconate 324 (37.5 Fe) MG TABS Take 1 tablet by mouth 2 times daily (Patient not taking: Reported on 09/29/2021) 60  tablet 3    triamcinolone (KENALOG) 0.1 % cream  (Patient not taking: Reported on 09/29/2021)      alfuzosin (UROXATRAL) 10 MG extended release tablet Take 1 tablet by mouth daily (Patient not taking: Reported on 09/29/2021)       No current facility-administered medications for this visit.       Allergies:     Cat hair extract, Dust mite extract, Other, and Fish allergy    Patient History:    Past Medical History:   Diagnosis Date    Arthritis     Atrial fibrillation ([place])     Bleeding ulcer 08/2018    Cancer [facility])     Prostate cancer dx March 2010- tx with radiation     Colon cancer [facility]) 2000    per old chart dx with colon ca - tx with surgery only    Diabetes mellitus ([place])     "was borderline- at one time was on Metformin for several yrs-  when I went to [facility] they had me stop this in 2017"    Fracture     "had broken neck in Nov 2016- wore a collar for 6 weeks only"    HOH (hard of hearing)     bil hearing aide    Hx of blood clots     "had blood clot - it was a  blood clot in  my esophagus "    Hx of Doppler ultrasound 09/19/2019    EF IS 55-60%  Bi atrial enlargement noted.   Moderate mitral, aortic, tricuspid and mild pulmonic regurgitation is present    Hx of fall     "last fall 2016- use cane walker and have electric chair"    Hyperlipidemia     Hypertension     follow with Dr Freeman Caldron at [facility] range of motion (ROM) of shoulder     "right shoulder haved 10 % usage and left shoulder 60 % of usage"    Neuropathy     both feet    Sleep apnea     "had sleep study- tried to give me cpap but could not tolerate it "    TIA (transient ischemic attack)     'in Jan 2009- trouble with speech, numbness of lip, weakness right arm - all only lasted for 20 seconds"    Unsteady gait     "hx of back problem- was on steroids for 4 yrs- since then stability issues with my back -     Wears glasses     to read      Past Surgical History:   Procedure Laterality Date    ABDOMEN SURGERY  2000    per old chart had right hemicolectomy "removed 2 and a half feet of colon and my appendix"    COLONOSCOPY  2019    ENDOSCOPY, COLON, DIAGNOSTIC      per old chart had EGD done 08/2018 and 09/2018    HERNIA REPAIR      per old chart open left ing hernia 1980 and then recurrent left ing hernia repair 08/2018    JOINT REPLACEMENT      per old chart total left knee 2018    PACEMAKER PLACEMENT      per old chart hx of st jude pacer insertion 2008- new pacer inserted 2019    SHOULDER SURGERY Left 2015    rota. cuff    SHOULDER SURGERY Right 02/27/2019    RIGHT REVERSE  SHOULDER TOTAL ARTHROPLASTY REVERSE performed by Avel Sensor, MD at [facility] OR     Family History   Problem Relation Age of Onset    Cancer Mother         lung cancer    Diabetes Mother     Diabetes Sister     Cancer Brother     High Blood Pressure Brother      Social History     Tobacco Use    Smoking status: Never    Smokeless tobacco: Never   Substance Use Topics    Alcohol use: Yes     Comment: "maybe 6 times per year" "when had prostate cancer did drink one glass red wine daily in the past  1cup coffee        Review of Systems:      Constitutional:  No Fever or Weight Loss   Eyes: No Decreased Vision  ENT: No Headaches, Hearing Loss or Vertigo  Cardiovascular: No Chest Pain,  No Shortness of breath, No Palpitations. No Edema   Respiratory: No cough or wheezing . No Respiratory distress   Gastrointestinal: No abdominal pain, appetite loss, blood in stools, constipation, diarrhea or heartburn  Genitourinary: No dysuria, trouble voiding, or hematuria  Musculoskeletal:  denies any new  joint aches , or pain   Integumentary: No rash or pruritis  Neurological: No TIA or stroke symptoms  Psychiatric: No anxiety or depression  Endocrine: No malaise, fatigue or temperature intolerance  Hematologic/Lymphatic: No bleeding problems, blood clots or swollen lymph nodes  Allergic/Immunologic: No nasal congestion or hives        Objective:      Physical Exam:    BP 108/76 (Site: Right Upper Arm, Position: Sitting, Cuff Size: Medium Adult)   Pulse 88   Ht '5\' 7"'$  (1.702 m)   Wt 175 lb 3.2 oz (79.5 kg)   SpO2 97%   BMI 27.44 kg/m   Wt Readings from Last 3 Encounters:   09/29/21 175 lb 3.2 oz (79.5 kg)   05/25/21 174 lb (78.9 kg)   04/24/21 174 lb 6.4 oz (79.1 kg)     Body mass index is 27.44 kg/m.  Vitals:    09/29/21 0818   BP: 108/76   Pulse: 88   SpO2: 97%        General Appearance and Constitutional: Conversant, Well developed, Well nourished, No acute distress, Non-toxic appearance.   HEENT:  Normocephalic, Atraumatic, Bilateral external ears normal, Oropharynx moist, No oral exudates,   Nose normal.   Neck- Normal range of motion, No tenderness, Supple  Eyes:  EOMI, Conjunctiva normal, No discharge.   Respiratory:  Normal breath sounds, No respiratory distress, No wheezing, No Rales, No Ronchi.  No chest tenderness.   Cardiovascular: S1-S2 IRIR, no added heart sounds, No Mumurs appreciated. No gallops, rubs. No Pedal Edema   GI:  Bowel sounds normal, Soft, No tenderness,  GU:  No costovertebral angle tenderness   Musculoskeletal:  No gross  deformities. Back- No tenderness  Integument:  Well hydrated, no rash   Lymphatic:  No lymphadenopathy noted   Neurologic:  Alert & oriented x 3, Normal motor function, normal sensory function, no focal deficits noted   Psychiatric:  Speech and behavior appropriate       Medical decision making and Data review:    DATA:    Lab Results   Component Value Date    TROPONINT <0.010 03/26/2021     BNP:    Lab Results  Component Value Date    PROBNP 2,032 (H) 01/13/2020     PT/INR:  No results found for: [facility]  Lab Results   Component Value Date    LABA1C 5.8 02/26/2021    LABA1C 6.1 09/29/2020     Lab Results   Component Value Date    CHOL 80 02/26/2021    TRIG 125 02/26/2021    HDL 36 (L) 02/26/2021    LDLCALC 19 02/26/2021    LDLDIRECT 37 02/26/2021     Lab Results   Component Value Date    WBC 8.6 07/23/2021    HGB 11.1 (L) 07/23/2021    HCT 35.5 (L) 07/23/2021    MCV 87.7 07/23/2021    PLT 93 (L) 07/23/2021     TSH: No results found for: TSH  Lab Results   Component Value Date    AST 17 07/23/2021    ALT 17 07/23/2021    BILITOT 0.8 07/23/2021    ALKPHOS 73 07/23/2021         All labs, medications and tests reviewed by myself including data and history from outside source , patient and available family .      1. Paroxysmal atrial fibrillation ([place])    2. Dyslipidemia    3. Essential hypertension    4. Sick sinus syndrome (H[facility])           Impression and Plan:    Chronic atrial fibrillation, with history of TIA as well as hx of LV thrombus.  CHA2DS2-VASc score is > 6. on low-dose Eliquis understanding high risk of ischemic stroke versus bleeding risk. Cont BB  - No LV thrombus noted on last Echo     Anemia as well as thrombocytopenia ( Follows with Dr. Franchot Mimes) last hemoglobin 11.3.  Stable.  On Iron supplements.  Can be observed on low-dose OAC.     Hyperlipidemia: Cont lipitor 40 mg daily      HTN: Cont Metoprolol 50 mg po daily, BP well controlled     Hyperlipidemia: Continue with Lipitor 40 mg daily    SSS s.p PPM .  VVIR - Follow up with pacer clinic as outpt - Pacemaker report reviewed 12/08/2020       Return in about 6 months (around 04/01/2022).          Counseled extensively and medication compliance urged.  We discussed that for the  prevention of ASCVD our  goal is aggressive risk modification.Patient is encouraged to exercise even a brisk walk for 30 minutes  at least 3 to 4 times a week   Various goals were discussed and questions answered. Continue current medications. Appropriate prescriptions are addressed and refills ordered.  Questions answered and patient verbalizes understanding.  Call for any problems, questions, or concerns.

## 2021-10-29 ENCOUNTER — Inpatient Hospital Stay: Admit: 2021-10-29 | Discharge: 2021-10-29 | Payer: MEDICARE | Primary: Family

## 2021-10-29 ENCOUNTER — Ambulatory Visit: Admit: 2021-10-29 | Discharge: 2021-10-29 | Payer: MEDICARE | Attending: Internal Medicine | Primary: Family

## 2021-10-29 DIAGNOSIS — D472 Monoclonal gammopathy: Secondary | ICD-10-CM

## 2021-10-29 NOTE — Progress Notes (Signed)
MA Rooming Questions  Patient: Dalton Warner  MRN: [id number]    Date: 10/29/2021        1. Do you have any new issues?   no         2. Do you need any refills on medications?    no    3. Have you had any imaging done since your last visit?   no    4. Have you been hospitalized or seen in the emergency room since your last visit here?   no    5. Did the patient have a depression screening completed today? No    No data recorded     PHQ-9 Given to (if applicable):               PHQ-9 Score (if applicable):                     '[]'$  Positive     '[]'$   Negative              Does question #9 need addressed (if applicable)                     '[]'$  Yes    '[]'$   No               Forest Becker, CMA

## 2021-10-29 NOTE — Progress Notes (Signed)
Patient Name:  Dalton Warner  Patient DOB:  07-27-1930  Patient MRN:  [id number]     Primary Oncologist: Charisse Klinefelter, MD  Referring Provider: Carloyn Manner, APRN - CNP     Date of Service: 10/29/2021      Reason for Consult:  Thrombocytopenia     Chief Complaint:    Chief Complaint   Patient presents with    Follow-up         Encounter Diagnoses   Name Primary?    Monoclonal gammopathy Yes    Anemia, unspecified type     Thrombocytopenia (HCC)           HPI:   11/22/18: Arrived alone to the clinic today. Loves to talk.  According to Dr. Hortense Ramal his oncologist at [facility] he was diagnosed with prostate cancer in 2010.  Received external beam radiation therapy.  He has been on Xarelto since 2017 for A. fib.  Prior to which he was on Coumadin since 2008.  Further work-up was negative for hepatitis B, hepatitis C and HIV.  No evidence of hemolysis.  B12 was on lower side.  Folate was normal.  Iron studies were normal.  Serum protein electrophoresis was normal.  But imaging of fixation revealed a small IgG lambda monoclonal band.  Was advised to be on B12 supplements.  Today at the office he reported that he had hernia surgery on July 24 when he received platelet transfusion.  Reported that his been caregiver to his wife for 20 years and is currently at [facility] home at the other mass unit.  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  Married for 67 years.  Lost a lot of weight in the process of taking care of his wife.  Reported tiredness.  No bleeding or any rash.  No chest pain.  Reported arthritis pain all the time.  Reported that he is taking oral B12 supplements thousand micrograms daily.    11/02/2016: [id number] review demonstrates a bone marrow that is approximately 30% cellular with trilineage hematopoiesis and 1-2% blasts based on the manual differential count, as confirmed by immunophenotypic studies. There are mild myeloid dyspoietic changes as well  as minimal erythroid atypia. Although reactive etiologies including medications, toxins and nutritional deficiencies must be excluded, the patient's cytopenias and bone marrow morphologic findings raise the possibility of an underlying myelodysplastic syndrome.  FISH/Cytogenetics: Normal    07/20/2018 CBC with WBC of 6.52 hemoglobin of 11.0 hematocrit 34.7 MCV of 93.3 platelets of 78 diff within normal limits    March 2020  platelet count was 81.    Jan 2020  it was 85K    09/2018: CT chest:1. Ectasia of the ascending thoracic aorta measuring 4.2 cm in short-axis diameter.  This may be due to aortic stenosis considering the aortic valve calcifications present.  2. No evidence of aortic dissection.  3. Atherosclerotic calcification of the 3 major native coronary arteries.  4. Moderate dilation of cardiac chambers.  5. Evidence of prior granulomatous disease with no definite pneumonia or suspicious-appearing pulmonary nodules.  6. Trace right pleural effusion.  7. Mild fatty infiltration of the liver.  8. Small sliding-type hiatal hernia.    09/08/18: Ultrasound:1. Mild ascites evident in the left upper quadrant.  2. Nonvisualized pancreas and majority of the abdominal aorta due to overlying bowel gas.  3. Otherwise unremarkable study including no evidence of cirrhosis and normal splenic size measuring 9.6 cm.  12/29/18: Ct shoulder:  1. Interstitial tearing of the subscapularis, supraspinatus, and   infraspinatus tendons with full-thickness tearing predominantly along the   conjoined fibers of the supraspinatus and infraspinatus tendons which   measures approximately 1.0 x 1.4 cm.  There is also full-thickness   perforation of the anterior fibers of the supraspinatus tendon.  Underlying   diffuse mild atrophy of the rotator cuff musculature.   2. Severe advanced osteoarthritis of the glenohumeral joint with underlying   diffuse complex labral tearing.   3. Severe osteoarthritis of the acromioclavicular joint.   4.  Synovitis.     Feb 27 2019:Right rotator cuff surgery. Lost lot of blood and received 3 units of PRBCs.     Feb 27 2019:  Unremarkable appearance of reverse glenohumeral arthroplasty.       AC joint osteoarthritis.         01/13/20:Presented to [facility] with sx of fatigue, Acute bronchitis, reflux sx, hans and fett feeling cold. generalized arthritis pains. No overt bleeding. No bruising. Palpitations. No pain, increased sob. No dizziness.      Feb 2022 CXR normal     03/10/2021 vitamin B12 841.1  CBC WBC 6.7 hemoglobin 11.3 hematocrit 35.3 MCV 88.5 platelets 85  CMP basically WNL  Ferritin 229  Folate 10.9  IgA 118  IgG 982  IgM 66    Electrophoresis protein: restricted band of protein migration in the gamma region which is too small to quantify. IFE gel shows a faint band in lambda which may be indicative of a specific immune response or an early monoclonal protein.     03/26/2021 CT abdomen pelvis  Impression   1. No acute intraabdominal process identified.   2. Colonic diverticulosis without CT evidence of diverticulitis.   3. Severe atherosclerotic disease.   CBC WBC 8.3 hemoglobin 11.4 hematocrit 35.0 MCV 87.3 platelets 84  CMP BUN 14 creatinine 0.8  Lipase 58  Magnesium 2.0    June 15, 2023B12 906 CBC with WBC of 8.6 hemoglobin of 11.1 hematocrit 35.5 MCV of 87.7 and platelets of 93.  CMP basically within normal limits ferritin 229 serum protein electrophoresis with a restricted band of protein migration in the gamma region which is too small to quantify.  Iron saturations of 96% folic acid 8.9     Past Medical History:     A. fib, osteoarthritis, borderline diabetes.  Seasonal allergies, prostate cancer hypertension.                                                           Past Surgery History:      Basal cell skin cancer removal, cardiac pacemaker, colectomy for diverticular disease, hernia repair, left knee replacement, appendectomy                                                              Social History:    Lives alone.  Has 3 children, they live in [place] in [place].  Retired as Financial trader, [employer] position.  Denies any smoking history or any other illicit drug abuse.  Family History:    Mother was a non-smoker was diagnosed with lung cancer.  Younger brother with colon cancer.  His sister was diagnosed with uterine, ovarian cancer at the age of 66.                                                                                           Allergies   Allergen Reactions    Cat Hair Extract Other (See Comments)     Runny nose    Dust Mite Extract Other (See Comments)     Runny nose    Other      Other reaction(s): Other (See Comments)  Dust/cats - sneezing    Fish Allergy Nausea And Vomiting     Salmon only  Salmon only       Current Outpatient Medications on File Prior to Visit   Medication Sig Dispense Refill    vitamin B-12 (CYANOCOBALAMIN) 1000 MCG tablet Take 1 tablet by mouth daily      apixaban (ELIQUIS) 2.5 MG TABS tablet Take 1 tablet by mouth 2 times daily 180 tablet 3    ketoconazole (NIZORAL) 2 % shampoo WASH SCALP EVERY OTHER DAY LEAVE ON 3-5 MINUTES BEFORE RINSING      ketoconazole (NIZORAL) 2 % cream APPLY TOPICALLY TO FACE AND NECK EVERY DAY AS NEEDED WHILE FLARING      ammonium lactate (AMLACTIN) 12 % cream       cetirizine (ZYRTEC) 10 MG tablet Take 1 tablet by mouth daily      pantoprazole (PROTONIX) 40 MG tablet Take 1 tablet by mouth 2 times daily (before meals) 60 tablet 2    guaiFENesin (MUCINEX) 600 MG extended release tablet Take 2 tablets by mouth 2 times daily      alfuzosin (UROXATRAL) 10 MG extended release tablet Take 1 tablet by mouth daily      atorvastatin (LIPITOR) 40 MG tablet Take 1 tablet by mouth daily      metoprolol succinate (TOPROL XL) 50 MG extended release tablet Take 1 tablet by mouth nightly      albuterol sulfate (PROAIR RESPICLICK) 254 (90 Base) MCG/ACT aerosol  powder inhalation 2 puffs every 6 hours as needed      montelukast (SINGULAIR) 10 MG tablet 1 tablet nightly       No current facility-administered medications on file prior to visit.     Interval history:10/29/21: Here to clinic alone using walker. Denied any rash or any bleeding. Denied any abdominal pain, nausea, emesis, diarrhea or any constipation. No chest pain, increased sob, palpitations or any dizziness. No GU sx. No weight loss.     Review of Systems:    As per the interval history, otherwise rest of the ros negative     Vital Signs: BP 121/60 (Site: Right Upper Arm, Position: Sitting, Cuff Size: Large Adult)   Pulse 97   Temp 97.7 F (36.5 C) (Temporal)   Resp 18   Ht 5' 7"  (1.702 m)   Wt 171 lb (77.6 kg)   SpO2 98%   BMI 26.78 kg/m      Physical Exam:  CONSTITUTIONAL: awake, alert ,Uses  walker for ambulation.   EYES:PEAR, no palor or any icetrus  SWN:IOEV  NECK: No JVD  HEMATOLOGIC/LYMPHATIC: no cervical, supraclavicular or axillary lymphadenopathy   LUNGS: coarse bs left side   CARDIOVASCULAR: s1s2 irr irr ESM  ABDOMEN: soft ntnd bs pos  NEUROLOGIC: GI  SKIN: No rash  EXTREMITIES: no LE edema bilaterally.      Labs:  Hematology:  Lab Results   Component Value Date    WBC 8.6 07/23/2021    RBC 4.05 (L) 07/23/2021    HGB 11.1 (L) 07/23/2021    HCT 35.5 (L) 07/23/2021    MCV 87.7 07/23/2021    MCH 27.4 07/23/2021    MCHC 31.3 (L) 07/23/2021    RDW 20.8 (H) 07/23/2021    PLT 93 (L) 07/23/2021    MPV 10.9 07/23/2021    BANDSPCT 4 (L) 02/28/2019    SEGSPCT 62.8 07/23/2021    EOSRELPCT 1.6 07/23/2021    BASOPCT 0.8 07/23/2021    LYMPHOPCT 20.0 (L) 07/23/2021    MONOPCT 12.8 (H) 07/23/2021    BANDABS 0.50 02/28/2019    SEGSABS 5.4 07/23/2021    EOSABS 0.1 07/23/2021    BASOSABS 0.1 07/23/2021    LYMPHSABS 1.7 07/23/2021    MONOSABS 1.1 07/23/2021    DIFFTYPE AUTOMATED DIFFERENTIAL 07/23/2021    ANISOCYTOSIS 1+ 01/13/2020    WBCMORP OCCASIONAL 02/28/2019     Lab Results   Component Value Date    ESR 5  02/20/2019     Chemistry:  Lab Results   Component Value Date    NA 136 07/23/2021    K 4.8 07/23/2021    CL 101 07/23/2021    CO2 22 07/23/2021    BUN 12 07/23/2021    CREATININE 0.9 07/23/2021    GLUCOSE 140 (H) 07/23/2021    CALCIUM 8.5 07/23/2021    PROT 6.5 07/23/2021    PROT 6.6 07/23/2021    LABALBU 4.5 07/23/2021    LABALBU 4.17 07/23/2021    BILITOT 0.8 07/23/2021    ALKPHOS 73 07/23/2021    AST 17 07/23/2021    ALT 17 07/23/2021    LABGLOM >60 07/23/2021    GFRAA >60 09/29/2020    GFRAA >60 09/29/2020    MG 2.0 03/26/2021    POCGLU 99 02/27/2019     Lab Results   Component Value Date    LDH 170 05/24/2019     No results found for: "LD"  Lab Results   Component Value Date    TSHHS 2.770 02/26/2021     Immunology:  Lab Results   Component Value Date    PROT 6.5 07/23/2021    PROT 6.6 07/23/2021    SPEP  04/01/2020     INTERPRETATION - A faint free lambda light chains monoclonal band is detected. SAF    SPEP  04/01/2020     INTERPRETATION - Concurrent immunofixation detected a faint free lambda light chains monoclonal band, which is too small to measure. SAF    ALBUMINELP 3.6 04/01/2020    LABALPH 0.28 07/23/2021    LABALPH 0.67 07/23/2021    GAMGLOB 0.9 04/01/2020     No results found for: "KAPPAUVOL", "LAMBDAUVOL", "KLFLCR"  No results found for: "B2M"  Coagulation Panel:  Lab Results   Component Value Date    PROTIME 15.6 (H) 03/26/2021    INR 1.21 03/26/2021    APTT 46.4 (H) 03/03/2019     Anemia Panel:  Lab Results   Component Value Date    VITAMINB12  906.4 07/23/2021    FOLATE 8.9 07/23/2021     Tumor Markers:  Lab Results   Component Value Date    PSA 1.39 02/26/2021        Observations:  ECOG:  No data recorded       Assessment & Plan:                                                          Chronic thrombocytopenia: Etiology could be secondary to underlying MDS vs ITP.  Cytogenetics in the past were normal, blast being 2%, his IPSS score was about 1.5 which is a low score. No evidence of hemolysis,  hepatitis, HIV in the past.  June 2023 platelets 93K   Continue to monitor for now and discussed repeat BMB/aspiration for further evaluation and treatment plan if counts declining.  Discussed the role of TPO agent in elective surgery.    Normocytic anemia: Could be sec to early MDS? Hb stable at 11 and ferriotin  and b12 normal in June 2023, continue to follow for now.     Monoclonal gammopathy noted to have small IgG lambda band on the immunofixation but normal serum protein electrophoresis.  Normal renal function normal calcium levels.  Serum immunoglobulins and serum free light chains were normal.  No evidence of any myeloma defining disease other than anemia which is probably not sec to that.  Repeat panel in June 2023 with very faint M protein too small to measure. Will monitor yearly    Paroxysmal atrial fibrillation.  Has pacemaker.  Is on Eliquis 2.5 mg po bid.   Continue as long as a platelet count of more than 60 K.  Avoid falls, deep cuts and monitor closely for any bleeding.     H/o Colon cancer: s/p resection in 2000, did not receive any adjuvant treatment. Also Family history of colon cancer.  Reported that he is up-to-date with the colonoscopy.    H/O Prostate cancer: Diagnosed in 2010. S/P XRT. Is being followed by Urology.     GERD: Pharmacological intervention not really helping but restricting diet has been helping. Has been tried on dexlansoprazole as well. Has been evaluated by GI    Family history ovarian cancer.  No genetic testing has been done and we discussed about that. He defered genetic testing at this point    Continue other medical care.    Discussed above findings and plan with him and he voiced understanding.  Answered all his questions.    Discussed healthy lifestyle including healthy diet, regular exercise as tolerated.  Also discussed importance of being up-to-date with age-appropriate screening tools. Colonoscopy looks like was done in 2019, but he said he would  follow    Recommend follow-up with primary care physician and other specialists.    Please do not hesitate to contact us if you need further information.    Return to clinic march 2024 or earlier if new symptoms.      This note is created with the assistance of a speech-recognition program. While intending to generate a document that accurately reflects the content of the visit, no guarantee can be provided that every mistake has been identified and corrected by editing.    [place]

## 2021-10-30 NOTE — Nursing Note (Signed)
Formatting of this note might be different from the original.  Pt sts he was holding onto a rail walking and feels like a splinter may have went into his left pointer finger. Sts he woke up this morning and it is puffy and red and is painful.   Electronically signed by Luanne Bras, MA at 10/30/2021 11:47 AM EDT

## 2021-10-30 NOTE — Progress Notes (Signed)
Formatting of this note might be different from the original.  12/02/21    Pt returned requesting ultrasound of finger for confirmation of FB removal. Order sent with pt to [facility].  Electronically signed by Letitia Libra, PA-C at 12/02/2021 10:28 AM EDT

## 2021-10-30 NOTE — Progress Notes (Signed)
Associated Order(s): Foreign Body Removal  Post-Procedure Diagnose(s): Foreign body in skin of left middle finger  Formatting of this note is different from the original.  Images from the original note were not included.  Assessment/Plan   No diagnosis found.    MEDICAL DECISION MAKING:  Patient states he was holding a wooden rail while walking yesterday and sustained a splinter to the left middle finger.  States he was able to remove some of the wood at home yesterday but is concerned because the area is painful, inflamed, and very tender this morning.  There is concerned that there is possible retained foreign body.  VS WNL.  Foreign body palpated of the distal phalanx of the left middle finger.  See foreign body removal note below.  No complications, Td UTD (0998).  Patient recommended to monitor the area very closely for signs of infection.  He may return to clinic in 2 to 3 days for wound check or if there is concern for infection.    Total visit including procedure 45 minutes.     COUNSELING:  Reviewed medication SE, indications, risks, and instructions. I spoke with the patient in appropriate terminology of our work-up in the urgent care, discussed today's findings and their diagnosis, in addition to providing specific details for the plan of care. I have also given anticipatory guidance and expectant management of their condition. Questions are answered and there is agreement with the plan; patient given AVS at discharge.They are instructed to FU with their PCP or go to the ED if their condition changes or worsens in any way.    Patient discharged home in stable condition.     Subjective   CHIEF COMPLAINT:  Chief Complaint   Patient presents with    Finger Problem     NURSING NOTES:  Nursing Notes:   Luanne Bras, MA  10/30/21 1147  Signed  Pt sts he was holding onto a rail walking and feels like a splinter may have went into his left pointer finger. Sts he woke up this morning and it is puffy and red  and is painful.      HISTORY OF PRESENT ILLNESS:  Dalton Warner is a 88 year old male who presents today with the following concerns:  Patient states he was holding a wooden rail while walking yesterday and sustained a splinter to the left middle finger.  States he was able to remove some of the wood at home yesterday but is concerned because the area is painful, inflamed, and very tender this morning.  There is concerned that there is possible retained foreign body.    PAST MEDICAL HISTORY:  Past Medical History:   Diagnosis Date    Allergic rhinitis     Anemia     BPH (benign prostatic hyperplasia)     Gastroesophageal reflux disease     Hypercholesteremia        CURRENT MEDICATIONS:    Current Outpatient Medications:     triamcinolone acetonide (KENALOG) 0.1 % topical cream, , Disp: , Rfl:     alfuzosin (UROXATRAL) 10 mg SR-tablet 24 Hr, , Disp: , Rfl:     ammonium lactate (LAC-HYDRIN) 12 % topical cream, , Disp: , Rfl:     apixaban (ELIQUIS) 2.5 mg tablet, , Disp: , Rfl:     AtorvaSTATin (LIPITOR) 40 mg tablet, , Disp: , Rfl:     cetirizine (ZYRTEC) 10 mg tablet, , Disp: , Rfl:     cyanocobalamin (VITAMIN B12) 1,000 mcg  tablet, 1,000 mcg, Disp: , Rfl:     ferrous sulfate 324 mg (65 mg iron) EC-tablet, 324 mg, Disp: , Rfl:     guaiFENesin SR (MUCINEX) 600 mg tablet, 600 mg, Disp: , Rfl:     ketoconazole (NIZORAL) 2 % topical cream, , Disp: , Rfl:     metoprolol SUCCINATE (TOPROL-XL) 25 mg SR-tablet 24 Hr, Take 25 mg by mouth, Disp: , Rfl:     montelukast (SINGULAIR) 10 mg tablet, , Disp: , Rfl:     pantoprazole (PROTONIX) 40 mg enteric-coated tablet, , Disp: , Rfl:     albuterol sulfate 90 mcg/actuation Aer. Pow BA, 2 Puffs every 6 hours as needed, Disp: , Rfl:     benzonatate (TESSALON PERLE) 100 mg capsule, Take 1 Cap by mouth three times a day, Disp: 21 Cap, Rfl: 0     ALLERGIES:   Allergenic extracts, Cat dander, Fish allergy, and Fish containing products    SOCIAL HISTORY:   Social History     Tobacco Use     Smoking status: Not on file    Smokeless tobacco: Not on file   Substance Use Topics    Alcohol use: Not on file       Objective   REVIEW OF SYSTEMS:  Review of Systems   Constitutional:  Negative for fatigue and fever.   Gastrointestinal:  Negative for abdominal pain, nausea and vomiting.   Musculoskeletal:  Negative for gait problem and joint swelling.   Skin:  Positive for color change and wound.   Neurological:  Negative for dizziness, weakness, light-headedness and numbness.     PHYSICAL EXAM:   Nursing notes and vital signs documented and reviewed.  VITAL SIGNS:  Visit Vitals  BP 117/70   Pulse 66   Resp 16   Wt 77.1 kg (170 lb)   SpO2 98%   BMI 26.63 kg/m   BSA 1.91 m     Physical Exam  Vitals and nursing note reviewed.   Constitutional:       Appearance: Normal appearance.   HENT:      Head: Normocephalic and atraumatic.      Nose: Nose normal. No rhinorrhea.   Eyes:      Conjunctiva/sclera: Conjunctivae normal.   Pulmonary:      Effort: Pulmonary effort is normal. No respiratory distress.      Breath sounds: Normal breath sounds. No stridor.   Musculoskeletal:        Hands:    Skin:     General: Skin is warm and dry.   Neurological:      Mental Status: He is alert and oriented to person, place, and time.       Radiology / Procedures:  No orders to display   Foreign Body Removal    Date/Time: 10/30/2021 11:45 AM  Performed by: Letitia Libra, PA-C  Authorized by: Letitia Libra, PA-C     Consent:     Consent obtained:  Verbal    Consent given by:  Patient    Risks, benefits, and alternatives were discussed: yes      Risks discussed:  Infection, bleeding, pain, incomplete removal and worsening of condition    Alternatives discussed:  No treatment and alternative treatment  Universal protocol:     Procedure explained and questions answered to patient or proxy's satisfaction: yes      Relevant documents present and verified: yes      Test results available: yes  Imaging studies available: yes       Site/side marked: yes      Immediately prior to procedure, a time out was called: yes      Patient identity confirmed:  Verbally with patient  Location:     Location:  Finger    Finger location:  L middle finger    Tendon involvement:  None  Anesthesia:     Anesthesia method:  Local infiltration    Local anesthetic:  Lidocaine 1% w/o epi  Procedure type:     Procedure complexity:  Simple  Procedure details:     Localization method:  Visualized    Removal mechanism:  Forceps    Foreign bodies recovered:  1    Intact foreign body removal: yes    Post-procedure details:     Neurovascular status: intact      Confirmation:  No additional foreign bodies on visualization    Skin closure:  None    Dressing:  Non-adherent dressing    Procedure completion:  Tolerated    In-clinic POC testing:  No results found for this visit on 10/30/21.    I have reviewed the nursing note as well as documented past medical, surgical, family, and social history sections including the medications and allergies listed in the above medical record except as noted.     FINAL IMPRESSION:  No diagnosis found.  Outpatient Encounter Medications as of 10/30/2021   Medication Sig Dispense Refill    triamcinolone acetonide (KENALOG) 0.1 % topical cream       alfuzosin (UROXATRAL) 10 mg SR-tablet 24 Hr       ammonium lactate (LAC-HYDRIN) 12 % topical cream       apixaban (ELIQUIS) 2.5 mg tablet       AtorvaSTATin (LIPITOR) 40 mg tablet       cetirizine (ZYRTEC) 10 mg tablet       cyanocobalamin (VITAMIN B12) 1,000 mcg tablet 1,000 mcg      ferrous sulfate 324 mg (65 mg iron) EC-tablet 324 mg      guaiFENesin SR (MUCINEX) 600 mg tablet 600 mg      ketoconazole (NIZORAL) 2 % topical cream       metoprolol SUCCINATE (TOPROL-XL) 25 mg SR-tablet 24 Hr Take 25 mg by mouth      montelukast (SINGULAIR) 10 mg tablet       pantoprazole (PROTONIX) 40 mg enteric-coated tablet       albuterol sulfate 90 mcg/actuation Aer. Pow BA 2 Puffs every 6 hours as needed       benzonatate (TESSALON PERLE) 100 mg capsule Take 1 Cap by mouth three times a day 21 Cap 0     No facility-administered encounter medications on file as of 10/30/2021.     There are no Patient Instructions on file for this visit.    The patient was given an After Visit Summary sheet.    Letitia Libra, PA-C  [facility] [place]  [phone] [place] [address]  [phone]    This note is electronically signed in the electronic medical record.       Electronically signed by Letitia Libra, PA-C at 10/30/2021  5:29 PM EDT

## 2021-11-23 ENCOUNTER — Encounter: Payer: MEDICARE | Attending: Pulmonary Disease | Primary: Family

## 2021-11-30 ENCOUNTER — Encounter: Admit: 2021-11-30 | Discharge: 2021-11-30 | Payer: MEDICARE | Attending: Pulmonary Disease | Primary: Family

## 2021-11-30 DIAGNOSIS — J4530 Mild persistent asthma, uncomplicated: Secondary | ICD-10-CM

## 2021-11-30 NOTE — Progress Notes (Signed)
Subjective:   CHIEF COMPLAINT / HPI:  no cough no sob      Past Medical History:  Past Medical History:   Diagnosis Date    Arthritis     Atrial fibrillation ([place])     Bleeding ulcer 08/2018    Cancer [facility])     Prostate cancer dx March 2010- tx with radiation     Colon cancer [facility]) 2000    per old chart dx with colon ca - tx with surgery only    Diabetes mellitus ([place])     "was borderline- at one time was on Metformin for several yrs-  when I went to [facility] they had me stop this in 2017"    Fracture     "had broken neck in Nov 2016- wore a collar for 6 weeks only"    HOH (hard of hearing)     bil hearing aide    Hx of blood clots     "had blood clot - it was a  blood clot in  my esophagus "    Hx of Doppler ultrasound 09/19/2019    EF IS 55-60%  Bi atrial enlargement noted.  Moderate mitral, aortic, tricuspid and mild pulmonic regurgitation is present    Hx of fall     "last fall 2016- use cane walker and have electric chair"    Hyperlipidemia     Hypertension     follow with Dr Freeman Caldron at [place] range of motion (ROM) of shoulder     "right shoulder haved 10 % usage and left shoulder 60 % of usage"    Neuropathy     both feet    Sleep apnea     "had sleep study- tried to give me cpap but could not tolerate it "    TIA (transient ischemic attack)     'in Jan 2009- trouble with speech, numbness of lip, weakness right arm - all only lasted for 20 seconds"    Unsteady gait     "hx of back problem- was on steroids for 4 yrs- since then stability issues with my back -     Wears glasses     to read        Past Surgical History:        Procedure Laterality Date    ABDOMEN SURGERY  2000    per old chart had right hemicolectomy "removed 2 and a half feet of colon and my appendix"    COLONOSCOPY  2019    ENDOSCOPY, COLON, DIAGNOSTIC      per old chart had EGD done 08/2018 and 09/2018    HERNIA REPAIR      per old chart open left ing hernia 1980 and then recurrent left ing hernia repair 08/2018    JOINT  REPLACEMENT      per old chart total left knee 2018    PACEMAKER PLACEMENT      per old chart hx of st jude pacer insertion 2008- new pacer inserted 2019    SHOULDER SURGERY Left 2015    rota. cuff    SHOULDER SURGERY Right 02/27/2019    RIGHT REVERSE SHOULDER TOTAL ARTHROPLASTY REVERSE performed by Avel Sensor, MD at [facility] OR       Current Medications:    No current facility-administered medications for this visit.    Allergies   Allergen Reactions    Cat Hair Extract Other (See Comments)     Runny nose  Dust Mite Extract Other (See Comments)     Runny nose    Other      Other reaction(s): Other (See Comments)  Dust/cats - sneezing    Fish Allergy Nausea And Vomiting     Salmon only  Salmon only       Social History:    Social History     Socioeconomic History    Marital status: Widowed     Spouse name: None    Number of children: None    Years of education: None    Highest education level: None   Tobacco Use    Smoking status: Never    Smokeless tobacco: Never   Vaping Use    Vaping Use: Never used   Substance and Sexual Activity    Alcohol use: Yes     Comment: "maybe 6 times per year" "when had prostate cancer did drink one glass red wine daily in the past  1cup coffee    Drug use: Never       Family History:   Family History   Problem Relation Age of Onset    Cancer Mother         lung cancer    Diabetes Mother     Diabetes Sister     Cancer Brother     High Blood Pressure Brother        Immunization:  Immunization History   Administered Date(s) Administered    COVID-19, MODERNA BLUE border, Primary or Immunocompromised, (age 12y+), IM, 100 mcg/0.18m 02/19/2019, 04/03/2019, 12/18/2019, 05/22/2020    Influenza A (H1N1-09) Vaccine PF IM 01/26/2008    Influenza Virus Vaccine 11/09/2010, 11/09/2011, 11/22/2012, 10/09/2013, 10/27/2014, 02/09/2015, 10/12/2015, 10/16/2015, 10/18/2015, 02/08/2017, 10/24/2018, 10/07/2020    Influenza, FLUAD, (age 8543y+), Adjuvanted, 0.546m09/16/2020, 11/05/2019, 10/06/2021     Influenza, High Dose (Fluzone 65 yrs and older) 10/17/2015, 10/20/2016, 11/07/2016    Influenza, Triv, inactivated, subunit, adjuvanted, IM (Fluad 65 yrs and older) 10/31/2017    Pneumococcal Vaccine 11/08/1997, 11/09/2010    Pneumococcal, PCV-13, PREVNAR 1376(age 85w+), IM, 0.25m31m0/02/2010, 07/17/2015    Pneumococcal, PPSV23, PNEUMOVAX 23, (age 2y+), SC/IM, 0.25mL38m/20/2015, 12/04/2014, 01/12/2017    TDaP, ADACEL (age 10y-75y-64yOOSTRIX (age 10y+), IM, 0.25mL 52m01/2009, 02/09/2012, 12/14/2012, 04/13/2018    Td vaccine (adult) 02/08/1993, 12/21/2006, 12/14/2012    Tetanus 12/21/2006    Zoster Live (Zostavax) 12/09/2005    Zoster Recombinant (Shingrix) 07/06/2017, 09/08/2017         REVIEW OF SYSTEMS:    CONSTITUTIONAL:  negative for fevers, chills, diaphoresis, activity change, appetite change, fatigue, night sweats and unexpected weight change.   EYES:  negative for blurred vision, eye discharge, visual disturbance and icterus  HEENT:  negative for hearing loss, tinnitus, ear drainage, sinus pressure, nasal congestion, epistaxis and snoring  RESPIRATORY:  See HPI  CARDIOVASCULAR:  negative for chest pain, palpitations, exertional chest pressure/discomfort, edema, syncope  GASTROINTESTINAL:  negative for nausea, vomiting, diarrhea, constipation, blood in stool and abdominal pain  GENITOURINARY:  negative for frequency, dysuria and hematuria  HEMATOLOGIC/LYMPHATIC:  negative for easy bruising, bleeding and lymphadenopathy  ALLERGIC/IMMUNOLOGIC:  negative for recurrent infections, angioedema, anaphylaxis and drug reactions  ENDOCRINE:  negative for weight changes and diabetic symptoms including polyuria, polydipsia and polyphagia    MUSCULOSKELETAL:  negative for  pain, joint swelling, decreased range of motion and muscle weakness  NEUROLOGICAL:  negative for headaches, slurred speech, unilateral weakness  PSYCHIATRIC/BEHAVIORAL: negative for hallucinations, behavioral problems, confusion and agitation.  Objective:    PHYSICAL EXAM:      VITALS:    Vitals:    11/30/21 0902   Pulse: 85   Resp: 18   Weight: 78 kg (172 lb)   Height: 1.702 m ('5\' 7"'$ )         CONSTITUTIONAL:  awake, alert, cooperative, no apparent distress, and appears stated age  NECK:  Supple, symmetrical, trachea midline, no adenopathy, thyroid symmetric, not enlarged and no tenderness  CHEST: Chest expansion equal and symmetrical, no intercostal retraction.  LUNGS:  no increased work of breathing, has expiratory wheezes both lungs, no crackles.  CARDIOVASCULAR: S1 and S2, no edema and no JVD  ABDOMEN:  normal bowel sounds, non-distended and no masses palpated, and no tenderness to palpation. No hepatospleenomegaly  LYMPHADENOPATHY:  no axillary or supraclavicular adenopathy. No cervical adnenopathy  PSYCHIATRIC: Oriented to person place and time. No obvious depression or anxiety.  MUSCULOSKELETAL: No obvious misalignment or effusion of the joints. No clubbing, cyanosis of the digits.  RIGHT AND LEFT LOWER EXTREMITIES: No edema, no inflammation, no tenderness.  SKIN:  normal skin color, texture, turgor and no redness, warmth, or swelling. No palpable nodules    DATA:                        Assessment:     Mild asthma          Plan:     D/w pt  Cpm  No refill needed  Rtc 6 months

## 2021-12-07 ENCOUNTER — Ambulatory Visit: Attending: Cardiovascular Disease | Primary: Family

## 2021-12-07 DIAGNOSIS — Z950 Presence of cardiac pacemaker: Secondary | ICD-10-CM

## 2021-12-23 NOTE — Telephone Encounter (Signed)
Patient woke up this morning with Blood in his sweat socks.  He then saw that a place on his leg had bursted open and was bleeding. He cleaned that up and fixed a bandage. Then another spot opened up and he went to his PCP they cleaned it up and bandage it.  He was told by PCP to call his Cardiologist to see what should be done.    Please advise.

## 2021-12-25 NOTE — Telephone Encounter (Signed)
Spoke to pt Patient advised and voices understanding.

## 2022-02-02 MED ORDER — APIXABAN 2.5 MG PO TABS
2.5 MG | ORAL_TABLET | Freq: Two times a day (BID) | ORAL | 3 refills | Status: DC
Start: 2022-02-02 — End: 2023-01-13

## 2022-02-02 NOTE — Telephone Encounter (Signed)
Patient called he has developed red spots   On the same spot that he had called about   Previously,on his shins, worried about a   infection

## 2022-02-02 NOTE — Telephone Encounter (Signed)
Spoke to pt regarding red spots on legs, referred him to PCP.

## 2022-03-08 ENCOUNTER — Ambulatory Visit: Attending: Cardiovascular Disease | Primary: Family

## 2022-03-08 DIAGNOSIS — Z950 Presence of cardiac pacemaker: Secondary | ICD-10-CM

## 2022-03-31 NOTE — Telephone Encounter (Signed)
Patient called upset his care link box  Is flashing and all the lights are blinking please  Call asap very worried

## 2022-03-31 NOTE — Telephone Encounter (Signed)
Returned call to patient and he said that the [facility] monitor went off when he was sleeping. The siren was going and the light were flashing and he pressed the white button and it stopped. I did look in the network to see if there was a new transmission and there was not. I advised patient that the monitor and pacemaker may have lost connection and that it was looking for device and when he pushed the button device was found and monitor and device are linked again. Advised patient that if he has any more problems to call the office back or call the 800 phone number to [facility]. He voiced understanding.

## 2022-04-06 ENCOUNTER — Encounter: Payer: MEDICARE | Attending: Internal Medicine | Primary: Family

## 2022-04-14 NOTE — Telephone Encounter (Signed)
patient r/s  appt to 4/2 unable to come 3/13

## 2022-04-14 NOTE — Progress Notes (Signed)
Formatting of this note is different from the original.  Images from the original note were not included.  Assessment/Plan     ICD-10-CM ICD-9-CM    1. Paronychia of second toe of right foot  L03.031 681.11 trimethoprim-sulfamethoxazole (BACTRIM DS) 160-800 mg tablet       MEDICAL DECISION MAKING:  Pt c/o possible skin infection x  1 day on the R 2nd toe. States he notices that the nail of the 2nd toe has been very opaque and thick for the past year. States he was taking a bath yesterday and noticed redness and mild swelling of the R 2nd toe around the nail. States he started to squeeze it to see if anything would come out; pain is 4/10 VAS and worse with walking. He denies prior injury/trauma to the area.  Pt afebrile and NAD. There is swelling, redness, and tenderness of the distal phalanx of 2nd digit of the R foot. No evidence of fluctuance. Hypertrophied nail noted (onychauxis vs onychomycosis); recommend to FU with Dr Meda Coffee [facility]) or PCP.  Pt to start Bactrim BID x 7 days for paronychia, elevate the area as much as possible, and monitor/proceed to the ER if there is worsening redness, red streaks, pus-like drainage, swelling, pain, fever, chills.    COUNSELING:  Reviewed medication SE, indications, risks, and instructions. I spoke with the patient in appropriate terminology of our work-up in the urgent care, discussed today's findings and their diagnosis, in addition to providing specific details for the plan of care. I have also given anticipatory guidance and expectant management of their condition. Questions are answered and there is agreement with the plan; patient given AVS at discharge.They are instructed to FU with their PCP or go to the ED if their condition changes or worsens in any way.    Patient discharged home in stable condition.     Subjective   CHIEF COMPLAINT:  Chief Complaint   Patient presents with    Wound Infection     NURSING NOTES:  There are no exam notes on file for this  visit.     HISTORY OF PRESENT ILLNESS:  Dalton Warner is a 89 year old male who presents today with the following concerns:  Pt c/o possible skin infection x  1 day on the R 2nd toe. States he notices that the nail of the 2nd toe has been very opaque and thick for the past year. States he was taking a bath yesterday and noticed redness and mild swelling of the R 2nd toe around the nail. States he started to squeeze it to see if anything would come out; pain is 4/10 VAS and worse with walking. He denies prior injury/trauma to the area.    PAST MEDICAL HISTORY:  Past Medical History:   Diagnosis Date    Allergic rhinitis     Anemia     BPH (benign prostatic hyperplasia)     Gastroesophageal reflux disease     Hypercholesteremia        CURRENT MEDICATIONS:    Current Outpatient Medications:     trimethoprim-sulfamethoxazole (BACTRIM DS) 160-800 mg tablet, Take 1 Tab by mouth two times a day for 5 days, Disp: 10 Tab, Rfl: 0    triamcinolone acetonide (KENALOG) 0.1 % topical cream, , Disp: , Rfl:     alfuzosin (UROXATRAL) 10 mg SR-tablet 24 Hr, , Disp: , Rfl:     ammonium lactate (LAC-HYDRIN) 12 % topical cream, , Disp: , Rfl:  apixaban (ELIQUIS) 2.5 mg tablet, , Disp: , Rfl:     AtorvaSTATin (LIPITOR) 40 mg tablet, , Disp: , Rfl:     cetirizine (ZYRTEC) 10 mg tablet, , Disp: , Rfl:     cyanocobalamin (VITAMIN B12) 1,000 mcg tablet, 1,000 mcg, Disp: , Rfl:     ferrous sulfate 324 mg (65 mg iron) EC-tablet, 324 mg, Disp: , Rfl:     guaiFENesin SR (MUCINEX) 600 mg tablet, 600 mg, Disp: , Rfl:     ketoconazole (NIZORAL) 2 % topical cream, , Disp: , Rfl:     metoprolol SUCCINATE (TOPROL-XL) 25 mg SR-tablet 24 Hr, Take 25 mg by mouth, Disp: , Rfl:     montelukast (SINGULAIR) 10 mg tablet, , Disp: , Rfl:     pantoprazole (PROTONIX) 40 mg enteric-coated tablet, , Disp: , Rfl:     albuterol sulfate 90 mcg/actuation Aer. Pow BA, 2 Puffs every 6 hours as needed, Disp: , Rfl:     benzonatate (TESSALON PERLE) 100 mg capsule,  Take 1 Cap by mouth three times a day, Disp: 21 Cap, Rfl: 0     ALLERGIES:   Allergenic extracts, Cat dander, Fish allergy, and Fish containing products    Objective   PHYSICAL EXAM:   Nursing notes and vital signs documented and reviewed.  VITAL SIGNS:  Visit Vitals  BP 103/69   Pulse 79   Temp 97.8 F (36.6 C)   SpO2 99%     Physical Exam  Musculoskeletal:        Feet:    Feet:      Comments: Swelling, redness, and tenderness of the distal phalanx of 2nd digit of the R foot. Hypertrophied nail.  No evidence of fluctuance around the nailbed      Constitutional:  NAD, non-toxic appearance; well-nourished, appropriately dressed, cooperative  HEENT: Head: Normocephalic, atraumatic; Eyes: conjunctiva normal, white sclera, no d/c; Nose: No nasal drainage  Pulmonary/Chest:   No respiratory distress  Neurologic/Psychiatric:  Alert & oriented x 3; No focal deficits, fluent speech with clear words    Radiology / Procedures:  No orders to display     In-clinic POC testing:  No results found for this visit on 04/14/22.    I have reviewed the nursing note as well as documented past medical, surgical, family, and social history sections including the medications and allergies listed in the above medical record except as noted.     There are no Patient Instructions on file for this visit.    The patient was given an After Visit Summary sheet.    Letitia Libra, PA-C  [place]  [phone] [place] [address]  [phone]    This note is electronically signed in the electronic medical record.       Electronically signed by Letitia Libra, PA-C at 04/14/2022 11:27 AM EST

## 2022-04-19 ENCOUNTER — Inpatient Hospital Stay: Payer: MEDICARE | Primary: Family

## 2022-04-19 DIAGNOSIS — D699 Hemorrhagic condition, unspecified: Secondary | ICD-10-CM

## 2022-04-19 LAB — CBC WITH AUTO DIFFERENTIAL
Bands Absolute: 0.23 10*3/uL
Bands Relative: 3 % — ABNORMAL LOW (ref 5–11)
Eosinophils %: 1 % (ref 0–3)
Eosinophils Absolute: 0.1 10*3/uL
Hematocrit: 33.2 % — ABNORMAL LOW (ref 42–52)
Hemoglobin: 10.7 GM/DL — ABNORMAL LOW (ref 13.5–18.0)
Lymphocytes %: 5 % — ABNORMAL LOW (ref 24–44)
Lymphocytes Absolute: 0.4 10*3/uL
MCH: 27.5 PG (ref 27–31)
MCHC: 32.2 % (ref 32.0–36.0)
MCV: 85.3 FL (ref 78–100)
Monocytes %: 17 % — ABNORMAL HIGH (ref 0–4)
Monocytes Absolute: 1.3 10*3/uL
Myelocyte Percent: 1 % — ABNORMAL HIGH
Myelocytes Absolute: 0.08 10*3/uL
Platelets: 65 10*3/uL — ABNORMAL LOW (ref 140–440)
RBC: 3.89 10*6/uL — ABNORMAL LOW (ref 4.6–6.2)
RDW: 21.4 % — ABNORMAL HIGH (ref 11.7–14.9)
Segs Absolute: 5.4 10*3/uL
Segs Relative: 73 % — ABNORMAL HIGH (ref 36–66)
WBC: 7.5 10*3/uL (ref 4.0–10.5)

## 2022-04-19 LAB — COMPREHENSIVE METABOLIC PANEL
ALT: 16 U/L (ref 10–40)
AST: 28 IU/L (ref 15–37)
Albumin: 4.4 GM/DL (ref 3.4–5.0)
Alkaline Phosphatase: 90 IU/L (ref 40–128)
Anion Gap: 13 (ref 7–16)
BUN: 17 MG/DL (ref 6–23)
CO2: 21 MMOL/L (ref 21–32)
Calcium: 8.7 MG/DL (ref 8.3–10.6)
Chloride: 96 mMol/L — ABNORMAL LOW (ref 99–110)
Creatinine: 1.2 MG/DL (ref 0.9–1.3)
Est, Glom Filt Rate: 58 mL/min/{1.73_m2} — ABNORMAL LOW (ref >60)
Glucose: 99 MG/DL (ref 70–99)
Potassium: 4.6 MMOL/L (ref 3.5–5.1)
Sodium: 130 MMOL/L — ABNORMAL LOW (ref 135–145)
Total Bilirubin: 1.2 MG/DL — ABNORMAL HIGH (ref 0.0–1.0)
Total Protein: 6.5 GM/DL (ref 6.4–8.2)

## 2022-04-19 LAB — VITAMIN B12: Vitamin B-12: 736.8 pg/ml (ref 211–911)

## 2022-04-19 LAB — FOLATE: Folate: 6.7 NG/ML (ref 3.1–17.5)

## 2022-04-19 LAB — IRON AND TIBC
Iron: 29 ug/dL — ABNORMAL LOW (ref 59–158)
TIBC: 273 ug/dL (ref 250–450)
Transferrin %: 11 % (ref 10–44)
UIBC: 244 ug/dL (ref 110–370)

## 2022-04-19 LAB — FERRITIN: Ferritin: 322 NG/ML (ref 30–400)

## 2022-04-21 ENCOUNTER — Encounter: Payer: MEDICARE | Attending: Internal Medicine | Primary: Family

## 2022-04-28 ENCOUNTER — Inpatient Hospital Stay: Admit: 2022-04-28 | Discharge: 2022-04-28 | Payer: MEDICARE | Primary: Family

## 2022-04-28 ENCOUNTER — Encounter: Admit: 2022-04-28 | Discharge: 2022-04-28 | Payer: MEDICARE | Attending: Internal Medicine | Primary: Family

## 2022-04-28 DIAGNOSIS — D472 Monoclonal gammopathy: Secondary | ICD-10-CM

## 2022-04-28 DIAGNOSIS — D649 Anemia, unspecified: Secondary | ICD-10-CM

## 2022-04-28 NOTE — Progress Notes (Unsigned)
MA Rooming Questions  Patient: Dalton Warner  MRN: [id number]    Date: 04/28/2022        1. Do you have any new issues?   no         2. Do you need any refills on medications?    no    3. Have you had any imaging done since your last visit?   no    4. Have you been hospitalized or seen in the emergency room since your last visit here?   no    5. Did the patient have a depression screening completed today? No    No data recorded     PHQ-9 Given to (if applicable):               PHQ-9 Score (if applicable):                     []  Positive     []   Negative              Does question #9 need addressed (if applicable)                     []  Yes    []   No               Isidoro Donning, MA

## 2022-04-28 NOTE — Progress Notes (Signed)
Patient Name:  Dalton Warner  Patient DOB:  1930-10-26  Patient MRN:  [id number]     Primary Oncologist: Charisse Klinefelter, MD  Referring Provider: Carloyn Manner, APRN - CNP     Date of Service: 04/28/2022      Reason for Consult:  Thrombocytopenia     Chief Complaint:    Chief Complaint   Patient presents with    Follow-up         Encounter Diagnoses   Name Primary?    Monoclonal gammopathy Yes    Thrombocytopenia (HCC)     Anemia, unspecified type           HPI:   11/22/18: Arrived alone to the clinic today. Loves to talk.  According to Dr. Hortense Ramal his oncologist at [facility] he was diagnosed with prostate cancer in 2010.  Received external beam radiation therapy.  He has been on Xarelto since 2017 for A. fib.  Prior to which he was on Coumadin since 2008.  Further work-up was negative for hepatitis B, hepatitis C and HIV.  No evidence of hemolysis.  B12 was on lower side.  Folate was normal.  Iron studies were normal.  Serum protein electrophoresis was normal.  But imaging of fixation revealed a small IgG lambda monoclonal band.  Was advised to be on B12 supplements.  Today at the office he reported that he had hernia surgery on July 24 when he received platelet transfusion.  Reported that his been caregiver to his wife for 20 years and is currently at [facility] home at the other mass unit.  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  Married for 67 years.  Lost a lot of weight in the process of taking care of his wife.  Reported tiredness.  No bleeding or any rash.  No chest pain.  Reported arthritis pain all the time.  Reported that he is taking oral B12 supplements thousand micrograms daily.    11/02/2016: [id number] review demonstrates a bone marrow that is approximately 30% cellular with trilineage hematopoiesis and 1-2% blasts based on the manual differential count, as confirmed by immunophenotypic studies. There are mild myeloid dyspoietic changes as well  as minimal erythroid atypia. Although reactive etiologies including medications, toxins and nutritional deficiencies must be excluded, the patient's cytopenias and bone marrow morphologic findings raise the possibility of an underlying myelodysplastic syndrome.  FISH/Cytogenetics: Normal    07/20/2018 CBC with WBC of 6.52 hemoglobin of 11.0 hematocrit 34.7 MCV of 93.3 platelets of 78 diff within normal limits    March 2020  platelet count was 81.    Jan 2020  it was 85K    09/2018: CT chest:1. Ectasia of the ascending thoracic aorta measuring 4.2 cm in short-axis diameter.  This may be due to aortic stenosis considering the aortic valve calcifications present.  2. No evidence of aortic dissection.  3. Atherosclerotic calcification of the 3 major native coronary arteries.  4. Moderate dilation of cardiac chambers.  5. Evidence of prior granulomatous disease with no definite pneumonia or suspicious-appearing pulmonary nodules.  6. Trace right pleural effusion.  7. Mild fatty infiltration of the liver.  8. Small sliding-type hiatal hernia.    09/08/18: Ultrasound:1. Mild ascites evident in the left upper quadrant.  2. Nonvisualized pancreas and majority of the abdominal aorta due to overlying bowel gas.  3. Otherwise unremarkable study including no evidence of cirrhosis and normal splenic size measuring 9.6 cm.  12/29/18: Ct shoulder:  1. Interstitial tearing of the subscapularis, supraspinatus, and   infraspinatus tendons with full-thickness tearing predominantly along the   conjoined fibers of the supraspinatus and infraspinatus tendons which   measures approximately 1.0 x 1.4 cm.  There is also full-thickness   perforation of the anterior fibers of the supraspinatus tendon.  Underlying   diffuse mild atrophy of the rotator cuff musculature.   2. Severe advanced osteoarthritis of the glenohumeral joint with underlying   diffuse complex labral tearing.   3. Severe osteoarthritis of the acromioclavicular joint.   4.  Synovitis.     Feb 27 2019:Right rotator cuff surgery. Lost lot of blood and received 3 units of PRBCs.     Feb 27 2019:  Unremarkable appearance of reverse glenohumeral arthroplasty.       AC joint osteoarthritis.         01/13/20:Presented to [facility] with sx of fatigue, Acute bronchitis, reflux sx, hans and fett feeling cold. generalized arthritis pains. No overt bleeding. No bruising. Palpitations. No pain, increased sob. No dizziness.      Feb 2022 CXR normal     03/10/2021 vitamin B12 841.1  CBC WBC 6.7 hemoglobin 11.3 hematocrit 35.3 MCV 88.5 platelets 85  CMP basically WNL  Ferritin 229  Folate 10.9  IgA 118  IgG 982  IgM 66    Electrophoresis protein: restricted band of protein migration in the gamma region which is too small to quantify. IFE gel shows a faint band in lambda which may be indicative of a specific immune response or an early monoclonal protein.     03/26/2021 CT abdomen pelvis  Impression   1. No acute intraabdominal process identified.   2. Colonic diverticulosis without CT evidence of diverticulitis.   3. Severe atherosclerotic disease.   CBC WBC 8.3 hemoglobin 11.4 hematocrit 35.0 MCV 87.3 platelets 84  CMP BUN 14 creatinine 0.8  Lipase 58  Magnesium 2.0    June 15, 2023B12 906 CBC with WBC of 8.6 hemoglobin of 11.1 hematocrit 35.5 MCV of 87.7 and platelets of 93.  CMP basically within normal limits ferritin 229 serum protein electrophoresis with a restricted band of protein migration in the gamma region which is too small to quantify.  Iron saturations of 123XX123 folic acid 8.9    0000000, B12 736 CBC with WBC of 7.5 hemoglobin of 10.7 hematocrit 33.2 and platelets of 65 K CMP with creatinine 1.2 total bilirubin of 1.2 ferritin of [id number] folic acid 6.7     Past Medical History:     A. fib, osteoarthritis, borderline diabetes.  Seasonal allergies, prostate cancer hypertension.                                                           Past Surgery History:      Basal cell skin cancer removal, cardiac  pacemaker, colectomy for diverticular disease, hernia repair, left knee replacement, appendectomy                                                              Social History:   Lives alone.  Has 3 children, they live in [place] in [place].  Retired as Financial trader, [employer] position.  Denies any smoking history or any other illicit drug abuse.                                                                                                    Family History:    Mother was a non-smoker was diagnosed with lung cancer.  Younger brother with colon cancer.  His sister was diagnosed with uterine, ovarian cancer at the age of 52.                                                                                           Allergies   Allergen Reactions    Cat Hair Extract Other (See Comments)     Runny nose    Dust Mite Extract Other (See Comments)     Runny nose    Other      Other reaction(s): Other (See Comments)  Dust/cats - sneezing    Fish Allergy Nausea And Vomiting     Salmon only  Salmon only       Current Outpatient Medications on File Prior to Visit   Medication Sig Dispense Refill    apixaban (ELIQUIS) 2.5 MG TABS tablet Take 1 tablet by mouth 2 times daily 180 tablet 3    vitamin B-12 (CYANOCOBALAMIN) 1000 MCG tablet Take 1 tablet by mouth daily      ketoconazole (NIZORAL) 2 % shampoo WASH SCALP EVERY OTHER DAY LEAVE ON 3-5 MINUTES BEFORE RINSING      ketoconazole (NIZORAL) 2 % cream APPLY TOPICALLY TO FACE AND NECK EVERY DAY AS NEEDED WHILE FLARING      ammonium lactate (AMLACTIN) 12 % cream       cetirizine (ZYRTEC) 10 MG tablet Take 1 tablet by mouth daily      pantoprazole (PROTONIX) 40 MG tablet Take 1 tablet by mouth 2 times daily (before meals) 60 tablet 2    guaiFENesin (MUCINEX) 600 MG extended release tablet Take 2 tablets by mouth 2 times daily      alfuzosin (UROXATRAL) 10 MG extended release tablet Take 1 tablet by mouth daily      atorvastatin (LIPITOR) 40 MG tablet Take 1 tablet by mouth daily       metoprolol succinate (TOPROL XL) 50 MG extended release tablet Take 1 tablet by mouth nightly      albuterol sulfate (PROAIR RESPICLICK) 123XX123 (90 Base) MCG/ACT aerosol powder inhalation 2 puffs every 6 hours as needed      montelukast (SINGULAIR) 10 MG tablet 1 tablet nightly       No current facility-administered medications on file prior to visit.     Interval  history:04/28/22: Here to clinic alone using walker.  Reported that he has been tired more so in the past 3 months.  Denies any bleeding.  Left eye erythema.  No pain.  No weight loss.  Appetite is okay.  Denying chest pain, abdominal pain, lower extremity edema.      Review of Systems:    As per the interval history, otherwise rest of the ros negative     Vital Signs: BP (!) 106/59 (Site: Left Upper Arm, Position: Sitting, Cuff Size: Small Adult)   Pulse 62   Temp 97.7 F (36.5 C) (Infrared)   Ht 1.702 m (5\' 7" )   Wt 76.7 kg (169 lb)   SpO2 97%   BMI 26.47 kg/m      Physical Exam:  CONSTITUTIONAL: awake, alert ,Uses walker for ambulation.   EYES:PEAR, no palor or any icetrus  [id number]  NECK: No JVD  HEMATOLOGIC/LYMPHATIC: no cervical, supraclavicular or axillary lymphadenopathy   LUNGS: coarse bs left side   CARDIOVASCULAR: s1s2 irr irr ESM  ABDOMEN: soft ntnd bs pos  NEUROLOGIC: GI  SKIN: No rash  EXTREMITIES: no LE edema bilaterally.      Labs:  Hematology:  Lab Results   Component Value Date    WBC 7.5 04/19/2022    RBC 3.89 (L) 04/19/2022    HGB 10.7 (L) 04/19/2022    HCT 33.2 (L) 04/19/2022    MCV 85.3 04/19/2022    MCH 27.5 04/19/2022    MCHC 32.2 04/19/2022    RDW 21.4 (H) 04/19/2022    PLT 65 (L) 04/19/2022    MPV 10.9 07/23/2021    BANDSPCT 3 (L) 04/19/2022    SEGSPCT 73.0 (H) 04/19/2022    EOSRELPCT 1.0 04/19/2022    BASOPCT 0.8 07/23/2021    LYMPHOPCT 5.0 (L) 04/19/2022    MONOPCT 17.0 (H) 04/19/2022    BANDABS 0.23 04/19/2022    SEGSABS 5.4 04/19/2022    EOSABS 0.1 04/19/2022    BASOSABS 0.1 07/23/2021    LYMPHSABS 0.4 04/19/2022     MONOSABS 1.3 04/19/2022    DIFFTYPE MANUAL DIFFERENTIAL 04/19/2022    ANISOCYTOSIS 1+ 04/19/2022    WBCMORP OCCASIONAL 02/28/2019     Lab Results   Component Value Date    ESR 5 02/20/2019     Chemistry:  Lab Results   Component Value Date    NA 130 (L) 04/19/2022    K 4.6 04/19/2022    CL 96 (L) 04/19/2022    CO2 21 04/19/2022    BUN 17 04/19/2022    CREATININE 1.2 04/19/2022    GLUCOSE 99 04/19/2022    CALCIUM 8.7 04/19/2022    PROT 6.5 04/19/2022    LABALBU 4.4 04/19/2022    BILITOT 1.2 (H) 04/19/2022    ALKPHOS 90 04/19/2022    AST 28 04/19/2022    ALT 16 04/19/2022    LABGLOM 58 (L) 04/19/2022    GFRAA >60 09/29/2020    GFRAA >60 09/29/2020    MG 2.0 03/26/2021    POCGLU 99 02/27/2019     Lab Results   Component Value Date    LDH 170 05/24/2019     No results found for: "LD"  Lab Results   Component Value Date    TSHHS 2.770 02/26/2021     Immunology:  Lab Results   Component Value Date    PROT 6.5 04/19/2022    SPEP  04/01/2020     INTERPRETATION - A faint free lambda light chains monoclonal band is  detected. SAF    SPEP  04/01/2020     INTERPRETATION - Concurrent immunofixation detected a faint free lambda light chains monoclonal band, which is too small to measure. SAF    ALBUMINELP 3.6 04/01/2020    LABALPH 0.28 07/23/2021    LABALPH 0.67 07/23/2021    GAMGLOB 0.9 04/01/2020     No results found for: "KAPPAUVOL", "LAMBDAUVOL", "KLFLCR"  No results found for: "B2M"  Coagulation Panel:  Lab Results   Component Value Date    PROTIME 15.6 (H) 03/26/2021    INR 1.21 03/26/2021    APTT 46.4 (H) 03/03/2019     Anemia Panel:  Lab Results   Component Value Date    VITAMINB12 736.8 04/19/2022    FOLATE 6.7 04/19/2022     Tumor Markers:  Lab Results   Component Value Date    PSA 1.39 02/26/2021        Observations:  ECOG:  No data recorded       Assessment & Plan:                                                          Chronic thrombocytopenia: Etiology could be secondary to underlying MDS vs ITP.  Cytogenetics in  the past were normal, blast being 2%, his IPSS score was about 1.5 which is a low score. No evidence of hemolysis, hepatitis, HIV in the past.  March 2024 platelet count of 65 K  Continue to monitor for now and discussed further evaluation including bone marrow biopsy but he defers at this point.  Discussed the role of TPO agent in elective surgery.    Normocytic anemia: Could be sec to early MDS? Hb stable around 11 and ferritin  and b12 normal.  Continue to follow for now.     Monoclonal gammopathy noted to have small IgG lambda band on the immunofixation but normal serum protein electrophoresis.  Normal renal function normal calcium levels.  Serum immunoglobulins and serum free light chains were normal.  No evidence of any myeloma defining disease other than anemia which is probably not sec to that.  Repeat panel in June 2023 with very faint M protein too small to measure. Will monitor yearly    Paroxysmal atrial fibrillation.  Has pacemaker.  Is on Eliquis 2.5 mg po bid.   Continue as long as a platelet count of more than 60 K.  Avoid falls, deep cuts and monitor closely for any bleeding.     H/o Colon cancer: s/p resection in 2000, did not receive any adjuvant treatment. Also Family history of colon cancer.  Reported that he is up-to-date with the colonoscopy.    H/O Prostate cancer: Diagnosed in 2010. S/P XRT. Is being followed by Urology.     GERD: Pharmacological intervention not really helping but restricting diet has been helping. Has been tried on dexlansoprazole as well. Has been evaluated by GI    Family history ovarian cancer.  No genetic testing has been done and we discussed about that. He defered genetic testing at this point    Continue other medical care.    Discussed above findings and plan with him and he voiced understanding.  Answered all his questions.    Discussed healthy lifestyle including healthy diet, regular exercise as tolerated.  Also discussed importance of being  up-to-date with  age-appropriate screening tools. Colonoscopy looks like was done in 2019, but he said he would follow    Recommend follow-up with primary care physician and other specialists.    Please do not hesitate to contact us if you need further information.    Return to clinic June 2024 or earlier if new symptoms.      This note is created with the assistance of a speech-recognition program. While intending to generate a document that accurately reflects the content of the visit, no guarantee can be provided that every mistake has been identified and corrected by editing.    [place]

## 2022-04-29 ENCOUNTER — Encounter: Payer: MEDICARE | Primary: Family

## 2022-04-29 ENCOUNTER — Encounter: Payer: MEDICARE | Attending: Internal Medicine | Primary: Family

## 2022-05-03 NOTE — Progress Notes (Signed)
Formatting of this note is different from the original.  Assessment/Plan     ICD-10-CM ICD-9-CM    1. URI with cough and congestion  J06.9 465.9 doxycycline hyclate 100 mg tablet     2. Acute bacterial conjunctivitis of left eye  H10.32 372.03 polymyxin B sulf-trimethoprim (POLYTRIM) 10,000 unit- 1 mg/mL EYE drops       Medical Decision Making     Patient presents with s/s and positive physical exam findings that support diagnosis and treatment for uri with cough and congestion, brown sputum. Started patient on Doxy. Also has left eye red with drainage x7 days, started him on poly trim.  Education provided regarding OTC Tylenol, Motrin for symptom management.   Patient is in agreement to seek ER care for life-threatening symptoms and follow-up with PCP as needed.  Patient is appropriate for discharge to home as there is no emergent condition nor need for escalation of care at this time.   Treatment plan was discussed with the patient in detail and they voiced understanding and agreement with it as stated in the assessment and plan section of this note.  Patient discharged from urgent care to home in stable condition without noted toxic or distressed appearance.    Subjective   Cough, Fatigue, and Eye Problem (left)    Nursing Notes:   Lytle Michaels, LPN  [id number]  Signed  89 yo male here today c/o productive cough with brown phlegm, fatigue, and left eye constantly watery. Pt has been taking Mucinex and tylenol.   Cough    Fatigue  Associated symptoms include congestion, coughing and fatigue.   Eye Problem     Dalton Warner is a 89 year old male who presents today with complaints of Cough, Fatigue, and Eye Problem (left)   Over a week worse 4 days ago    Review of Systems   Constitutional:  Positive for fatigue.   HENT:  Positive for congestion.    Respiratory:  Positive for cough.      Objective   Visit Vitals  BP 109/62   Pulse 74   Temp 98.9 F (37.2 C)   Resp 16   SpO2 97%     Physical Exam  Vitals and  nursing note reviewed.   Constitutional:       General: He is not in acute distress.     Appearance: Normal appearance. He is not toxic-appearing.   HENT:      Head: Normocephalic.      Right Ear: Tympanic membrane normal.      Left Ear: Tympanic membrane normal.      Nose: Congestion and rhinorrhea present.      Mouth/Throat:      Pharynx: Oropharynx is clear.   Eyes:      General:         Left eye: Discharge present.     Conjunctiva/sclera:      Left eye: Left conjunctiva is injected.   Cardiovascular:      Rate and Rhythm: Normal rate.      Pulses: Normal pulses.      Heart sounds: Normal heart sounds.   Pulmonary:      Effort: Pulmonary effort is normal.      Breath sounds: Rhonchi present.   Abdominal:      Palpations: Abdomen is soft.      Tenderness: There is no abdominal tenderness.   Skin:     General: Skin is warm and dry.   Neurological:  Mental Status: He is alert and oriented to person, place, and time.     Nursing notes and vital signs documented and reviewed.    Procedures    No orders to display     No results found for this visit on 05/03/22.     Allergies  Meds        I am having Dalton Warner start on doxycycline hyclate and polymyxin B sulf-trimethoprim. I am also having him maintain his triamcinolone acetonide, alfuzosin, ammonium lactate, apixaban, AtorvaSTATin, cetirizine, cyanocobalamin, ferrous sulfate, guaiFENesin SR, ketoconazole, metoprolol SUCCINATE, montelukast, pantoprazole, albuterol sulfate, and benzonatate.    Electronically signed by Delilah Shan, APRN at 05/03/2022  9:42 AM EDT

## 2022-05-03 NOTE — Nursing Note (Signed)
Formatting of this note might be different from the original.  89 yo male here today c/o productive cough with brown phlegm, fatigue, and left eye constantly watery. Pt has been taking Mucinex and tylenol.   Electronically signed by Lytle Michaels, LPN at [id number]  [id number] AM EDT

## 2022-05-11 ENCOUNTER — Encounter: Admit: 2022-05-11 | Discharge: 2022-05-11 | Payer: MEDICARE | Attending: Internal Medicine | Primary: Family

## 2022-05-11 DIAGNOSIS — I480 Paroxysmal atrial fibrillation: Secondary | ICD-10-CM

## 2022-05-11 NOTE — Progress Notes (Signed)
Dalton Morin, MD                                  CARDIOLOGY  NOTE         Chief Complaint:    Chief Complaint   Patient presents with    6 Month Follow-Up     Pt has splotches on both legs no other cardiac sx no surgeries or procedures scheduled that he is aware of CT scan today for urology      No CP   Tolerating oral anticoagulation well  Denies any chest pain shortness of breath         Echo: 09/19/2019    Left ventricular function is normal, EF is estimated at 55-60%.   Mild left ventricular hypertrophy.   Diastolic dysfunction could not be evaluated due to arrhythmia.   Abnormal (paradoxical) motion consistent with RV pacemaker.   No regional wall motion abnormalities were detected.   Bi atrial enlargement noted.   Sclerotic, but non-stenotic aortic valve.   Mitral annular calcification is present.   Moderate mitral, aortic, tricuspid and mild pulmonic regurgitation is   present.   Mild pulmonary hypertension with an RVSP of 9mmHg.   Dilation of the aortic root(4.0) and the ascending aorta(4.2).   No evidence of pericardial effusion.      Prior HPI:     Dalton Warner is a 89 y.o. year old male who was seen in the hospital for chronic atrial fibrillation.  Patient has history of chronic thrombocytopenia.  He has recently moved into town to take care of his wife.  Patient also has a history of LV thrombus in the past.    Chronic atrial fibrillation  CHA2DS2-VASc score is 6     EKG: Atrial fibrillation at 66 bpm      Current Outpatient Medications   Medication Sig Dispense Refill    apixaban (ELIQUIS) 2.5 MG TABS tablet Take 1 tablet by mouth 2 times daily 180 tablet 3    vitamin B-12 (CYANOCOBALAMIN) 1000 MCG tablet Take 1 tablet by mouth daily      ketoconazole (NIZORAL) 2 % shampoo WASH SCALP EVERY OTHER DAY LEAVE ON 3-5 MINUTES BEFORE RINSING      ketoconazole (NIZORAL) 2 % cream APPLY TOPICALLY TO FACE AND NECK EVERY DAY AS NEEDED WHILE FLARING      ammonium lactate (AMLACTIN) 12 % cream       cetirizine  (ZYRTEC) 10 MG tablet Take 1 tablet by mouth daily      pantoprazole (PROTONIX) 40 MG tablet Take 1 tablet by mouth 2 times daily (before meals) 60 tablet 2    guaiFENesin (MUCINEX) 600 MG extended release tablet Take 2 tablets by mouth 2 times daily      alfuzosin (UROXATRAL) 10 MG extended release tablet Take 1 tablet by mouth daily      atorvastatin (LIPITOR) 40 MG tablet Take 1 tablet by mouth daily      metoprolol succinate (TOPROL XL) 50 MG extended release tablet Take 1 tablet by mouth nightly      albuterol sulfate (PROAIR RESPICLICK) 123XX123 (90 Base) MCG/ACT aerosol powder inhalation 2 puffs every 6 hours as needed      montelukast (SINGULAIR) 10 MG tablet 1 tablet nightly       No current facility-administered medications for this visit.       Allergies:     Cat hair extract, Dust mite extract,  Other, and Fish allergy    Patient History:    Past Medical History:   Diagnosis Date    Arthritis     Atrial fibrillation ([place])     Bleeding ulcer 08/2018    Cancer [facility])     Prostate cancer dx March 2010- tx with radiation     Colon cancer [facility]) 2000    per old chart dx with colon ca - tx with surgery only    Diabetes mellitus ([place])     "was borderline- at one time was on Metformin for several yrs-  when I went to [facility] they had me stop this in 2017"    Fracture     "had broken neck in Nov 2016- wore a collar for 6 weeks only"    HOH (hard of hearing)     bil hearing aide    Hx of blood clots     "had blood clot - it was a  blood clot in  my esophagus "    Hx of Doppler ultrasound 09/19/2019    EF IS 55-60%  Bi atrial enlargement noted.  Moderate mitral, aortic, tricuspid and mild pulmonic regurgitation is present    Hx of fall     "last fall 2016- use cane walker and have electric chair"    Hyperlipidemia     Hypertension     follow with Dr Freeman Caldron at [place] range of motion (ROM) of shoulder     "right shoulder haved 10 % usage and left shoulder 60 % of usage"    Neuropathy     both feet     Sleep apnea     "had sleep study- tried to give me cpap but could not tolerate it "    TIA (transient ischemic attack)     'in Jan 2009- trouble with speech, numbness of lip, weakness right arm - all only lasted for 20 seconds"    Unsteady gait     "hx of back problem- was on steroids for 4 yrs- since then stability issues with my back -     Wears glasses     to read      Past Surgical History:   Procedure Laterality Date    ABDOMEN SURGERY  2000    per old chart had right hemicolectomy "removed 2 and a half feet of colon and my appendix"    COLONOSCOPY  2019    CT BONE MARROW BIOPSY  11/02/2016    CT BONE MARROW BIOPSY 11/02/2016    ENDOSCOPY, COLON, DIAGNOSTIC      per old chart had EGD done 08/2018 and 09/2018    HERNIA REPAIR      per old chart open left ing hernia 1980 and then recurrent left ing hernia repair 08/2018    JOINT REPLACEMENT      per old chart total left knee 2018    PACEMAKER PLACEMENT      per old chart hx of st jude pacer insertion 2008- new pacer inserted 2019    SHOULDER SURGERY Left 2015    rota. cuff    SHOULDER SURGERY Right 02/27/2019    RIGHT REVERSE SHOULDER TOTAL ARTHROPLASTY REVERSE performed by Avel Sensor, MD at [facility] OR     Family History   Problem Relation Age of Onset    Cancer Mother         lung cancer    Diabetes Mother     Diabetes Sister  Cancer Brother     High Blood Pressure Brother      Social History     Tobacco Use    Smoking status: Never    Smokeless tobacco: Never   Substance Use Topics    Alcohol use: Yes     Comment: "maybe 6 times per year" "when had prostate cancer did drink one glass red wine daily in the past  1cup coffee              Objective:      Physical Exam:    BP 126/78 (Site: Right Upper Arm, Position: Sitting, Cuff Size: Medium Adult)   Pulse 64   Ht 1.702 m (5\' 7" )   Wt 75.8 kg (167 lb)   BMI 26.16 kg/m   Wt Readings from Last 3 Encounters:   05/11/22 75.8 kg (167 lb)   04/28/22 76.7 kg (169 lb)   11/30/21 78 kg (172 lb)     Body mass index  is 26.16 kg/m.  Vitals:    05/11/22 1115   BP: 126/78   Pulse: 64        General Appearance and Constitutional: Conversant, Well developed, Well nourished, No acute distress, Non-toxic appearance.   HEENT:  Normocephalic, Atraumatic, Bilateral external ears normal, Oropharynx moist, No oral exudates,   Nose normal.   Neck- Normal range of motion, No tenderness, Supple  Eyes:  EOMI, Conjunctiva normal, No discharge.   Respiratory:  Normal breath sounds, No respiratory distress, No wheezing, No Rales, No Ronchi.  No chest tenderness.   Cardiovascular: S1-S2 IRIR, no added heart sounds, No Mumurs appreciated. No gallops, rubs. No Pedal Edema   GI:  Bowel sounds normal, Soft, No tenderness,  GU:  No costovertebral angle tenderness   Musculoskeletal:  No gross deformities. Back- No tenderness  Integument:  Well hydrated, no rash   Lymphatic:  No lymphadenopathy noted   Neurologic:  Alert & oriented x 3, Normal motor function, normal sensory function, no focal deficits noted   Psychiatric:  Speech and behavior appropriate       Medical decision making and Data review:    DATA:    Lab Results   Component Value Date    TROPONINT <0.010 03/26/2021     BNP:    Lab Results   Component Value Date    PROBNP 2,032 (H) 01/13/2020     PT/INR:  No results found for: "PTINR"  Lab Results   Component Value Date    LABA1C 5.8 02/26/2021    LABA1C 6.1 09/29/2020     Lab Results   Component Value Date    CHOL 80 02/26/2021    TRIG 125 02/26/2021    HDL 36 (L) 02/26/2021    LDLCALC 19 02/26/2021    LDLDIRECT 37 02/26/2021     Lab Results   Component Value Date    WBC 7.5 04/19/2022    HGB 10.7 (L) 04/19/2022    HCT 33.2 (L) 04/19/2022    MCV 85.3 04/19/2022    PLT 65 (L) 04/19/2022     TSH: No results found for: "TSH"  Lab Results   Component Value Date    AST 28 04/19/2022    ALT 16 04/19/2022    BILITOT 1.2 (H) 04/19/2022    ALKPHOS 90 04/19/2022         All labs, medications and tests reviewed by myself including data and history from  outside source , patient and available family .  1. Paroxysmal atrial fibrillation ([place])    2. Dyslipidemia    3. Essential hypertension    4. Sick sinus syndrome ([place])    5. Pacemaker             Impression and Plan:    Chronic atrial fibrillation, with history of TIA as well as hx of LV thrombus.  CHA2DS2-VASc score is > 6. on low-dose Eliquis understanding high risk of ischemic stroke versus bleeding risk. Cont BB  - No LV thrombus noted on last Echo     Anemia as well as thrombocytopenia ( Follows with Dr. Franchot Mimes) last hemoglobin 10.7.  Stable.  On Iron supplements.  Can be observed on low-dose OAC.   History of GI bleeding: Follows up with Dr. Elwin Sleight -no acute bleeding lately.  Can consider Watchman procedure in future if needed be    Hyperlipidemia: Cont lipitor 40 mg daily      HTN: Cont Metoprolol 50 mg po daily, BP well controlled     Hyperlipidemia: Continue with Lipitor 40 mg daily    SSS s.p PPM . VVIR - Follow up with pacer clinic as outpt - Pacemaker report reviewed 12/08/2020       Return in about 6 months (around 11/10/2022).          Counseled extensively and medication compliance urged.  We discussed that for the  prevention of ASCVD our  goal is aggressive risk modification.Patient is encouraged to exercise even a brisk walk for 30 minutes  at least 3 to 4 times a week   Various goals were discussed and questions answered. Continue current medications. Appropriate prescriptions are addressed and refills ordered.  Questions answered and patient verbalizes understanding.  Call for any problems, questions, or concerns.

## 2022-05-11 NOTE — Patient Instructions (Signed)
We are committed to providing you the best care possible.    If you receive a survey after visiting one of our offices, please take time to share your experience concerning your physician office visit.  These surveys are confidential and no health information about you is shared.    We are eager to improve for you and we are counting on your feedback to help make that happen.      **It is YOUR responsibilty to bring medication bottles and/or updated medication list to EACH APPOINTMENT. This will allow us to better serve you and all your healthcare needs**  Thank you for allowing us to care for you today!   We want to ensure we can follow your treatment plan and we strive to give you the best outcomes and experience possible.   If you ever have a life threatening emergency and call 911 - for an ambulance (EMS)   Our providers can only care for you at:   [facility] or [facility].   Even if you have someone take you or you drive yourself we can only care for you in a [facility] facility. Our providers are not setup at the other healthcare locations!   Please be informed that if you contact our office outside of normal business hours the physician on call cannot help with any scheduling or rescheduling issues, procedure instruction questions or any type of medication issue.    We advise you for any urgent/emergency that you go to the nearest emergency room!    PLEASE CALL OUR OFFICE DURING NORMAL BUSINESS HOURS    Monday - Friday   8 am to 5 pm    [place]: [phone]    [place]: [phone]    [place]:  [phone]

## 2022-06-07 ENCOUNTER — Encounter: Payer: MEDICARE | Attending: Pulmonary Disease | Primary: Family

## 2022-06-07 ENCOUNTER — Ambulatory Visit: Attending: Cardiovascular Disease | Primary: Family

## 2022-06-07 DIAGNOSIS — Z950 Presence of cardiac pacemaker: Secondary | ICD-10-CM

## 2022-06-14 ENCOUNTER — Ambulatory Visit: Admit: 2022-06-14 | Discharge: 2022-06-14 | Payer: MEDICARE | Attending: Pulmonary Disease | Primary: Family

## 2022-06-14 DIAGNOSIS — J4530 Mild persistent asthma, uncomplicated: Secondary | ICD-10-CM

## 2022-06-14 NOTE — Progress Notes (Signed)
Subjective:   CHIEF COMPLAINT / HPI:  doing well      Past Medical History:  Past Medical History:   Diagnosis Date    Arthritis     Atrial fibrillation (HCC)     Bleeding ulcer 08/2018    Cancer [facility])     Prostate cancer dx March 2010- tx with radiation     Colon cancer [facility]) 2000    per old chart dx with colon ca - tx with surgery only    Diabetes mellitus (HCC)     "was borderline- at one time was on Metformin for several yrs-  when I went to [facility] they had me stop this in 2017"    Fracture     "had broken neck in Nov 2016- wore a collar for 6 weeks only"    HOH (hard of hearing)     bil hearing aide    Hx of blood clots     "had blood clot - it was a  blood clot in  my esophagus "    Hx of Doppler ultrasound 09/19/2019    EF IS 55-60%  Bi atrial enlargement noted.  Moderate mitral, aortic, tricuspid and mild pulmonic regurgitation is present    Hx of fall     "last fall 2016- use cane walker and have electric chair"    Hyperlipidemia     Hypertension     follow with Dr Maryclare Labrador at [facility]    Limited range of motion (ROM) of shoulder     "right shoulder haved 10 % usage and left shoulder 60 % of usage"    Neuropathy     both feet    Sleep apnea     "had sleep study- tried to give me cpap but could not tolerate it "    TIA (transient ischemic attack)     'in Jan 2009- trouble with speech, numbness of lip, weakness right arm - all only lasted for 20 seconds"    Unsteady gait     "hx of back problem- was on steroids for 4 yrs- since then stability issues with my back -     Wears glasses     to read        Past Surgical History:        Procedure Laterality Date    ABDOMEN SURGERY  2000    per old chart had right hemicolectomy "removed 2 and a half feet of colon and my appendix"    COLONOSCOPY  2019    CT BONE MARROW BIOPSY  11/02/2016    CT BONE MARROW BIOPSY 11/02/2016    ENDOSCOPY, COLON, DIAGNOSTIC      per old chart had EGD done 08/2018 and 09/2018    HERNIA REPAIR      per old chart open left ing  hernia 1980 and then recurrent left ing hernia repair 08/2018    JOINT REPLACEMENT      per old chart total left knee 2018    PACEMAKER PLACEMENT      per old chart hx of st jude pacer insertion 2008- new pacer inserted 2019    SHOULDER SURGERY Left 2015    rota. cuff    SHOULDER SURGERY Right 02/27/2019    RIGHT REVERSE SHOULDER TOTAL ARTHROPLASTY REVERSE performed by Gracelyn Nurse, MD at [facility] OR       Current Medications:    No current facility-administered medications for this visit.    Allergies   Allergen Reactions  Cat Hair Extract Other (See Comments)     Runny nose    Dust Mite Extract Other (See Comments)     Runny nose    Other      Other reaction(s): Other (See Comments)  Dust/cats - sneezing    Fish Allergy Nausea And Vomiting     [place] only  Salmon only       Social History:    Social History     Socioeconomic History    Marital status: Widowed     Spouse name: None    Number of children: None    Years of education: None    Highest education level: None   Tobacco Use    Smoking status: Never    Smokeless tobacco: Never   Vaping Use    Vaping Use: Never used   Substance and Sexual Activity    Alcohol use: Yes     Comment: "maybe 6 times per year" "when had prostate cancer did drink one glass red wine daily in the past  1cup coffee    Drug use: Never       Family History:   Family History   Problem Relation Age of Onset    Cancer Mother         lung cancer    Diabetes Mother     Diabetes Sister     Cancer Brother     High Blood Pressure Brother        Immunization:  Immunization History   Administered Date(s) Administered    COVID-19, MODERNA BLUE border, Primary or Immunocompromised, (age 12y+), IM, 100 mcg/0.7mL 02/19/2019, 04/03/2019, 12/18/2019, 05/22/2020    Influenza A (H1N1-09) Vaccine PF IM 01/26/2008    Influenza Virus Vaccine 11/09/2010, 11/09/2011, 11/22/2012, 10/09/2013, 10/27/2014, 02/09/2015, 10/12/2015, 10/16/2015, 10/18/2015, 02/08/2017, 10/24/2018, 10/07/2020    Influenza, FLUAD,  (age 34 y+), Adjuvanted, 0.98mL 10/25/2018, 11/05/2019, 10/06/2021    Influenza, High Dose (Fluzone 65 yrs and older) 10/17/2015, 10/20/2016, 11/07/2016    Influenza, Triv, inactivated, subunit, adjuvanted, IM (Fluad 65 yrs and older) 10/31/2017    Pneumococcal Vaccine 11/08/1997, 11/09/2010    Pneumococcal, PCV-13, PREVNAR 13, (age 6w+), IM, 0.69mL 11/09/2010, 07/17/2015    Pneumococcal, PPSV23, PNEUMOVAX 23, (age 2y+), SC/IM, 0.89mL 10/28/2013, 12/04/2014, 01/12/2017    TDaP, ADACEL (age 25y-64y), BOOSTRIX (age 10y+), IM, 0.51mL 01/09/2008, 02/09/2012, 12/14/2012, 04/13/2018    Td vaccine (adult) 02/08/1993, 12/21/2006, 12/14/2012    Tetanus 12/21/2006    Zoster Live (Zostavax) 12/09/2005    Zoster Recombinant (Shingrix) 07/06/2017, 09/08/2017         REVIEW OF SYSTEMS:    CONSTITUTIONAL:  negative for fevers, chills, diaphoresis, activity change, appetite change, fatigue, night sweats and unexpected weight change.   EYES:  negative for blurred vision, eye discharge, visual disturbance and icterus  HEENT:  negative for hearing loss, tinnitus, ear drainage, sinus pressure, nasal congestion, epistaxis and snoring  RESPIRATORY:  See HPI  CARDIOVASCULAR:  negative for chest pain, palpitations, exertional chest pressure/discomfort, edema, syncope  GASTROINTESTINAL:  negative for nausea, vomiting, diarrhea, constipation, blood in stool and abdominal pain  GENITOURINARY:  negative for frequency, dysuria and hematuria  HEMATOLOGIC/LYMPHATIC:  negative for easy bruising, bleeding and lymphadenopathy  ALLERGIC/IMMUNOLOGIC:  negative for recurrent infections, angioedema, anaphylaxis and drug reactions  ENDOCRINE:  negative for weight changes and diabetic symptoms including polyuria, polydipsia and polyphagia    MUSCULOSKELETAL:  negative for  pain, joint swelling, decreased range of motion and muscle weakness  NEUROLOGICAL:  negative for headaches, slurred speech, unilateral  weakness  PSYCHIATRIC/BEHAVIORAL: negative for  hallucinations, behavioral problems, confusion and agitation.     Objective:   PHYSICAL EXAM:      VITALS:    Vitals:    06/14/22 0911   Pulse: 92   Resp: 18   SpO2: 97%   Weight: 78 kg (172 lb)   Height: 1.702 m (5\' 7" )         CONSTITUTIONAL:  awake, alert, cooperative, no apparent distress, and appears stated age  NECK:  Supple, symmetrical, trachea midline, no adenopathy, thyroid symmetric, not enlarged and no tenderness  CHEST: Chest expansion equal and symmetrical, no intercostal retraction.  LUNGS:  no increased work of breathing, has expiratory wheezes both lungs, no crackles.  CARDIOVASCULAR: S1 and S2, no edema and no JVD  ABDOMEN:  normal bowel sounds, non-distended and no masses palpated, and no tenderness to palpation. No hepatospleenomegaly  LYMPHADENOPATHY:  no axillary or supraclavicular adenopathy. No cervical adnenopathy  PSYCHIATRIC: Oriented to person place and time. No obvious depression or anxiety.  MUSCULOSKELETAL: No obvious misalignment or effusion of the joints. No clubbing, cyanosis of the digits.  RIGHT AND LEFT LOWER EXTREMITIES: No edema, no inflammation, no tenderness.  SKIN:  normal skin color, texture, turgor and no redness, warmth, or swelling. No palpable nodules    DATA:                        Assessment:     Mild asthma          Plan:     D/w pt  Cpm  No refill needed  Rtc 6 months

## 2022-07-29 ENCOUNTER — Inpatient Hospital Stay: Payer: MEDICARE | Primary: Family

## 2022-07-29 DIAGNOSIS — D649 Anemia, unspecified: Secondary | ICD-10-CM

## 2022-07-29 LAB — SEDIMENTATION RATE: Sed Rate, Automated: <1 MM/HR (ref 0–20)

## 2022-07-29 LAB — IRON AND TIBC
Iron: 103 ug/dL (ref 59–158)
TIBC: 304 ug/dL (ref 250–450)
Transferrin %: 34 % (ref 10–44)
UIBC: 201 ug/dL (ref 110–370)

## 2022-07-29 LAB — CBC WITH AUTO DIFFERENTIAL
Basophils %: 0.7 % (ref 0–1)
Basophils Absolute: 0.1 10*3/uL
Eosinophils %: 1.1 % (ref 0–3)
Eosinophils Absolute: 0.1 10*3/uL
Hematocrit: 35.3 % — ABNORMAL LOW (ref 42–52)
Hemoglobin: 11.1 GM/DL — ABNORMAL LOW (ref 13.5–18.0)
Immature Neutrophil %: 0.8 % — ABNORMAL HIGH (ref 0–0.43)
Lymphocytes %: 30.2 % (ref 24–44)
Lymphocytes Absolute: 2.1 10*3/uL
MCH: 27.4 PG (ref 27–31)
MCHC: 31.4 % — ABNORMAL LOW (ref 32.0–36.0)
MCV: 87.2 FL (ref 78–100)
Monocytes %: 12.4 % — ABNORMAL HIGH (ref 0–4)
Monocytes Absolute: 0.9 10*3/uL
Neutrophils %: 54.8 % (ref 36–66)
Neutrophils Absolute: 3.8 10*3/uL
Platelets: 86 10*3/uL — ABNORMAL LOW (ref 140–440)
RBC: 4.05 10*6/uL — ABNORMAL LOW (ref 4.6–6.2)
RDW: 21.9 % — ABNORMAL HIGH (ref 11.7–14.9)
Total Immature Neutrophil: 0.06 10*3/uL
WBC: 7.1 10*3/uL (ref 4.0–10.5)

## 2022-07-29 LAB — VITAMIN B12: Vitamin B-12: >2000 pg/ml — ABNORMAL HIGH (ref 211–911)

## 2022-07-29 LAB — COMPREHENSIVE METABOLIC PANEL
ALT: 12 U/L (ref 10–40)
AST: 18 IU/L (ref 15–37)
Albumin: 4.7 GM/DL (ref 3.4–5.0)
Alkaline Phosphatase: 74 IU/L (ref 40–129)
Anion Gap: 12 (ref 7–16)
BUN: 19 MG/DL (ref 6–23)
CO2: 24 MMOL/L (ref 21–32)
Calcium: 9.2 MG/DL (ref 8.3–10.6)
Chloride: 99 mMol/L (ref 99–110)
Creatinine: 0.9 MG/DL (ref 0.9–1.3)
Est, Glom Filt Rate: 81 mL/min/{1.73_m2} (ref >60)
Glucose: 88 MG/DL (ref 70–99)
Potassium: 4.8 MMOL/L (ref 3.5–5.1)
Sodium: 135 MMOL/L (ref 135–145)
Total Bilirubin: 0.9 MG/DL (ref 0.0–1.0)
Total Protein: 6.7 GM/DL (ref 6.4–8.2)

## 2022-07-29 LAB — RETICULOCYTES: Retic Ct Pct: 1.8 % (ref 0.2–2.20)

## 2022-07-29 LAB — FERRITIN: Ferritin: 192 NG/ML (ref 30–400)

## 2022-07-29 LAB — FOLATE: Folate: 8.6 NG/ML (ref 3.1–17.5)

## 2022-08-05 ENCOUNTER — Inpatient Hospital Stay: Admit: 2022-08-05 | Discharge: 2022-08-05 | Payer: Medicare Other | Primary: Family

## 2022-08-05 ENCOUNTER — Ambulatory Visit: Admit: 2022-08-05 | Discharge: 2022-08-05 | Payer: MEDICARE | Attending: Internal Medicine | Primary: Family

## 2022-08-05 DIAGNOSIS — D649 Anemia, unspecified: Secondary | ICD-10-CM

## 2022-08-05 DIAGNOSIS — D472 Monoclonal gammopathy: Secondary | ICD-10-CM

## 2022-08-05 NOTE — Progress Notes (Signed)
Patient Name:  BARNEY GESSERT  Patient DOB:  [birth year]  Patient MRN:  [id number]     Primary Oncologist: Joana Reamer, MD  Referring Provider: Leanna Sato, APRN - CNP     Date of Service: 08/05/2022      Reason for Consult:  Thrombocytopenia     Chief Complaint:    Chief Complaint   Patient presents with    Follow-up    Results         Encounter Diagnoses   Name Primary?    Monoclonal gammopathy Yes    Thrombocytopenia (HCC)     Anemia, unspecified type           HPI:   11/22/18: Arrived alone to the clinic today. Loves to talk.  According to Dr. Melynda Ripple his oncologist at [facility] he was diagnosed with prostate cancer in 2010.  Received external beam radiation therapy.  He has been on Xarelto since 2017 for A. fib.  Prior to which he was on Coumadin since 2008.  Further work-up was negative for hepatitis B, hepatitis C and HIV.  No evidence of hemolysis.  B12 was on lower side.  Folate was normal.  Iron studies were normal.  Serum protein electrophoresis was normal.  But imaging of fixation revealed a small IgG lambda monoclonal band.  Was advised to be on B12 supplements.  Today at the office he reported that he had hernia surgery on July 24 when he received platelet transfusion.  Reported that his been caregiver to his wife for 20 years and is currently at [facility] home at the other mass unit.  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  Married for 67 years.  Lost a lot of weight in the process of taking care of his wife.  Reported tiredness.  No bleeding or any rash.  No chest pain.  Reported arthritis pain all the time.  Reported that he is taking oral B12 supplements thousand micrograms daily.    11/02/2016: UJW:[id number] review demonstrates a bone marrow that is approximately 30% cellular with trilineage hematopoiesis and 1-2% blasts based on the manual differential count, as confirmed by immunophenotypic studies. There are mild myeloid dyspoietic  changes as well as minimal erythroid atypia. Although reactive etiologies including medications, toxins and nutritional deficiencies must be excluded, the patient's cytopenias and bone marrow morphologic findings raise the possibility of an underlying myelodysplastic syndrome.  FISH/Cytogenetics: Normal    07/20/2018 CBC with WBC of 6.52 hemoglobin of 11.0 hematocrit 34.7 MCV of 93.3 platelets of 78 diff within normal limits    March 2020  platelet count was 81.    Jan 2020  it was 85K    09/2018: CT chest:1. Ectasia of the ascending thoracic aorta measuring 4.2 cm in short-axis diameter.  This may be due to aortic stenosis considering the aortic valve calcifications present.  2. No evidence of aortic dissection.  3. Atherosclerotic calcification of the 3 major native coronary arteries.  4. Moderate dilation of cardiac chambers.  5. Evidence of prior granulomatous disease with no definite pneumonia or suspicious-appearing pulmonary nodules.  6. Trace right pleural effusion.  7. Mild fatty infiltration of the liver.  8. Small sliding-type hiatal hernia.    09/08/18: Ultrasound:1. Mild ascites evident in the left upper quadrant.  2. Nonvisualized pancreas and majority of the abdominal aorta due to overlying bowel gas.  3. Otherwise unremarkable study including no evidence of cirrhosis and normal splenic size measuring  9.6 cm.    12/29/18: Ct shoulder:  1. Interstitial tearing of the subscapularis, supraspinatus, and   infraspinatus tendons with full-thickness tearing predominantly along the   conjoined fibers of the supraspinatus and infraspinatus tendons which   measures approximately 1.0 x 1.4 cm.  There is also full-thickness   perforation of the anterior fibers of the supraspinatus tendon.  Underlying   diffuse mild atrophy of the rotator cuff musculature.   2. Severe advanced osteoarthritis of the glenohumeral joint with underlying   diffuse complex labral tearing.   3. Severe osteoarthritis of the acromioclavicular  joint.   4. Synovitis.     Feb 27 2019:Right rotator cuff surgery. Lost lot of blood and received 3 units of PRBCs.     Feb 27 2019:  Unremarkable appearance of reverse glenohumeral arthroplasty.       AC joint osteoarthritis.         01/13/20:Presented to [facility] with sx of fatigue, Acute bronchitis, reflux sx, hans and fett feeling cold. generalized arthritis pains. No overt bleeding. No bruising. Palpitations. No pain, increased sob. No dizziness.      Feb 2022 CXR normal     03/10/2021 vitamin B12 841.1  CBC WBC 6.7 hemoglobin 11.3 hematocrit 35.3 MCV 88.5 platelets 85  CMP basically WNL  Ferritin 229  Folate 10.9  IgA 118  IgG 982  IgM 66    Electrophoresis protein: restricted band of protein migration in the gamma region which is too small to quantify. IFE gel shows a faint band in lambda which may be indicative of a specific immune response or an early monoclonal protein.     03/26/2021 CT abdomen pelvis  Impression   1. No acute intraabdominal process identified.   2. Colonic diverticulosis without CT evidence of diverticulitis.   3. Severe atherosclerotic disease.   CBC WBC 8.3 hemoglobin 11.4 hematocrit 35.0 MCV 87.3 platelets 84  CMP BUN 14 creatinine 0.8  Lipase 58  Magnesium 2.0    June 15, 2023B12 906 CBC with WBC of 8.6 hemoglobin of 11.1 hematocrit 35.5 MCV of 87.7 and platelets of 93.  CMP basically within normal limits ferritin 229 serum protein electrophoresis with a restricted band of protein migration in the gamma region which is too small to quantify.  Iron saturations of 34% folic acid 8.9    04/19/2022, B12 736 CBC with WBC of 7.5 hemoglobin of 10.7 hematocrit 33.2 and platelets of 65 K CMP with creatinine 1.2 total bilirubin of 1.2 ferritin of 322 folic acid 6.7    07/29/2022 B12 more than 2000, CBC WBC 7.1 hemoglobin of 11.1 hematocrit 35.3 MCV of 87.2 and platelets of 86 KCMP within normal limits ferritin 192 folic acid 8.6 reticulocyte count of 1.8 iron saturations of 34% and sed rate less than  1     Past Medical History:     A. fib, osteoarthritis, borderline diabetes.  Seasonal allergies, prostate cancer hypertension.                                                           Past Surgery History:      Basal cell skin cancer removal, cardiac pacemaker, colectomy for diverticular disease, hernia repair, left knee replacement, appendectomy  Social History:   Lives alone.  Has 3 children, they live in [place] in [place].  Retired as Haematologist, [employer] position.  Denies any smoking history or any other illicit drug abuse.                                                                                                    Family History:    Mother was a non-smoker was diagnosed with lung cancer.  Younger brother with colon cancer.  His sister was diagnosed with uterine, ovarian cancer at the age of 21.                                                                                           Allergies   Allergen Reactions    Cat Hair Extract Other (See Comments)     Runny nose    Dust Mite Extract Other (See Comments)     Runny nose    Other      Other reaction(s): Other (See Comments)  Dust/cats - sneezing    Fish Allergy Nausea And Vomiting     Salmon only  Salmon only       Current Outpatient Medications on File Prior to Visit   Medication Sig Dispense Refill    SYSTANE 0.4-0.3 % ophthalmic solution       apixaban (ELIQUIS) 2.5 MG TABS tablet Take 1 tablet by mouth 2 times daily 180 tablet 3    vitamin B-12 (CYANOCOBALAMIN) 1000 MCG tablet Take 1 tablet by mouth daily      ketoconazole (NIZORAL) 2 % shampoo WASH SCALP EVERY OTHER DAY LEAVE ON 3-5 MINUTES BEFORE RINSING      ketoconazole (NIZORAL) 2 % cream APPLY TOPICALLY TO FACE AND NECK EVERY DAY AS NEEDED WHILE FLARING      ammonium lactate (AMLACTIN) 12 % cream       cetirizine (ZYRTEC) 10 MG tablet Take 1 tablet by mouth daily      pantoprazole (PROTONIX) 40 MG tablet Take 1 tablet by  mouth 2 times daily (before meals) 60 tablet 2    guaiFENesin (MUCINEX) 600 MG extended release tablet Take 2 tablets by mouth 2 times daily      alfuzosin (UROXATRAL) 10 MG extended release tablet Take 1 tablet by mouth daily      atorvastatin (LIPITOR) 40 MG tablet Take 1 tablet by mouth daily      metoprolol succinate (TOPROL XL) 50 MG extended release tablet Take 1 tablet by mouth nightly      albuterol sulfate (PROAIR RESPICLICK) 108 (90 Base) MCG/ACT aerosol powder inhalation 2 puffs every 6 hours as needed      montelukast (SINGULAIR) 10 MG tablet 1 tablet nightly  No current facility-administered medications on file prior to visit.     Interval history:08/05/22: Here to clinic alone using walker.  Reported that he has been tired but overall feels fine. Happy all the time.   Denies any bleeding.   No pain.  No weight loss.  Appetite is okay.  Denying chest pain, abdominal pain, lower extremity edema.      Review of Systems:    As per the interval history, otherwise rest of the ros negative     Vital Signs: BP (!) 110/59 (Site: Right Upper Arm, Position: Sitting, Cuff Size: Medium Adult)   Pulse 83   Temp 98 F (36.7 C) (Temporal)   Ht 1.702 m (5\' 7" )   Wt 76.7 kg (169 lb)   SpO2 96%   BMI 26.47 kg/m      Physical Exam:  CONSTITUTIONAL: awake, alert ,Uses walker for ambulation.   EYES:PEAR, no palor or any icetrus  YNW:GNFA  NECK: No JVD  HEMATOLOGIC/LYMPHATIC: no cervical, supraclavicular or axillary lymphadenopathy   LUNGS: coarse bs left side   CARDIOVASCULAR: s1s2 irr irr ESM  ABDOMEN: soft ntnd bs pos  NEUROLOGIC: GI  SKIN: No rash  EXTREMITIES: no LE edema bilaterally.      Labs:  Hematology:  Lab Results   Component Value Date    WBC 7.1 07/29/2022    RBC 4.05 (L) 07/29/2022    HGB 11.1 (L) 07/29/2022    HCT 35.3 (L) 07/29/2022    MCV 87.2 07/29/2022    MCH 27.4 07/29/2022    MCHC 31.4 (L) 07/29/2022    RDW 21.9 (H) 07/29/2022    PLT 86 (L) 07/29/2022    MPV 10.9 07/23/2021    BANDSPCT 3 (L)  04/19/2022    BASOPCT 0.7 07/29/2022    LYMPHOPCT 30.2 07/29/2022    MONOPCT 12.4 (H) 07/29/2022    BANDABS 0.23 04/19/2022    EOSABS 0.1 07/29/2022    BASOSABS 0.1 07/29/2022    MONOSABS 0.9 07/29/2022    DIFFTYPE  07/29/2022     AUTOMATED DIFF RESULTS CONFIRMED BY SMEAR REVIEW  AUTOMATED DIFFERENTIAL      ANISOCYTOSIS 1+ 07/29/2022    WBCMORP OCCASIONAL 02/28/2019     No results found for: "ESR"    Chemistry:  Lab Results   Component Value Date    NA 135 07/29/2022    K 4.8 07/29/2022    CL 99 07/29/2022    CO2 24 07/29/2022    BUN 19 07/29/2022    CREATININE 0.9 07/29/2022    GLUCOSE 88 07/29/2022    CALCIUM 9.2 07/29/2022    BILITOT 0.9 07/29/2022    ALKPHOS 74 07/29/2022    AST 18 07/29/2022    ALT 12 07/29/2022    LABGLOM 81 07/29/2022    GFRAA >60 09/29/2020    GFRAA >60 09/29/2020    MG 2.0 03/26/2021    POCGLU 99 02/27/2019     Lab Results   Component Value Date    LDH 170 05/24/2019     No results found for: "LD"  Lab Results   Component Value Date    TSHHS 2.770 02/26/2021     Immunology:  Lab Results   Component Value Date    SPEP  04/01/2020     INTERPRETATION - A faint free lambda light chains monoclonal band is detected. SAF    SPEP  04/01/2020     INTERPRETATION - Concurrent immunofixation detected a faint free lambda light chains monoclonal band, which is too  small to measure. SAF    ALBUMINELP 3.6 04/01/2020    GAMGLOB 0.9 04/01/2020     No results found for: "KAPPAUVOL", "LAMBDAUVOL", "KLFLCR"  No results found for: "B2M"  Coagulation Panel:  Lab Results   Component Value Date    PROTIME 15.6 (H) 03/26/2021    INR 1.21 03/26/2021    APTT 46.4 (H) 03/03/2019     Anemia Panel:  Lab Results   Component Value Date    VITAMINB12 >2000 (H) 07/29/2022    FOLATE 8.6 07/29/2022     Tumor Markers:  Lab Results   Component Value Date    PSA 1.39 02/26/2021        Observations:  ECOG:  PHQ-9 Total Score: 0 (08/05/2022  9:26 AM)         Assessment & Plan:                                                           Chronic thrombocytopenia: Etiology could be secondary to underlying MDS vs ITP.  Cytogenetics in the past were normal, blast being 2%, his IPSS score was about 1.5 which is a low score. No evidence of hemolysis, hepatitis, HIV in the past.  June 2024 platelet counts 85K  Continue to monitor for now and discussed further evaluation including bone marrow biopsy but he defers at this point and I also dont think there is a compelling need.   Discussed the role of TPO agent in elective surgery.    Normocytic anemia: Could be sec to early MDS? Hb stable around 11 and ferritin  and b12 not low. Continue to follow for now.   May d/c b12 supplements     Monoclonal gammopathy noted to have small IgG lambda band on the immunofixation but normal serum protein electrophoresis.  Normal renal function normal calcium levels.  Serum immunoglobulins and serum free light chains were normal.  No evidence of any myeloma defining disease other than anemia which is probably not sec to that.  Repeat panel in June 2023 with very faint M protein too small to measure. Will obtain with next labs     Paroxysmal atrial fibrillation.  Has pacemaker.  Is on Eliquis 2.5 mg po bid.   Continue as long as a platelet count of more than 60 K.  Avoid falls, deep cuts and monitor closely for any bleeding.     H/o Colon cancer: s/p resection in 2000, did not receive any adjuvant treatment. Also Family history of colon cancer.  Reported that he is up-to-date with the colonoscopy.    H/O Prostate cancer: Diagnosed in 2010. S/P XRT. Is being followed by Urology.     GERD: Pharmacological intervention not really helping but restricting diet has been helping. Has been tried on dexlansoprazole as well. Has been evaluated by GI    Family history ovarian cancer.  No genetic testing has been done and we discussed about that. He defered genetic testing at this point    Continue other medical care.    Discussed above findings and plan with him and he voiced  understanding.  Answered all his questions.    Discussed healthy lifestyle including healthy diet, regular exercise as tolerated.  Also discussed importance of being up-to-date with age-appropriate screening tools. Colonoscopy looks like was done in 2019, but  he said he would follow    Recommend follow-up with primary care physician and other specialists.    Please do not hesitate to contact us if you need further information.    Return to clinic June 2025 or earlier if new symptoms.      This note is created with the assistance of a speech-recognition program. While intending to generate a document that accurately reflects the content of the visit, no guarantee can be provided that every mistake has been identified and corrected by editing.    JC

## 2022-08-05 NOTE — Addendum Note (Signed)
Addended byJoana Reamer on: 08/05/2022 09:44 AM     Modules accepted: Orders

## 2022-08-05 NOTE — Progress Notes (Signed)
MA Rooming Questions  Patient: Dalton Warner  MRN: [id number]    Date: 08/05/2022        1. Do you have any new issues?   no         2. Do you need any refills on medications?    no    3. Have you had any imaging done since your last visit?   yes - labs 6/20    4. Have you been hospitalized or seen in the emergency room since your last visit here?   no    5. Did the patient have a depression screening completed today? Yes    PHQ-9 Total Score: 0 (08/05/2022  9:26 AM)       PHQ-9 Given to (if applicable):               PHQ-9 Score (if applicable):                     []  Positive     []   Negative              Does question #9 need addressed (if applicable)                     []  Yes    []   No               Blase Mess, CMA

## 2022-08-20 ENCOUNTER — Inpatient Hospital Stay: Payer: MEDICARE | Primary: Family

## 2022-08-20 DIAGNOSIS — R3129 Other microscopic hematuria: Secondary | ICD-10-CM

## 2022-08-22 LAB — PSA, TOTAL AND FREE
PSA, Free Pct: 5 %
PSA, Free: 0.1 ng/mL
PSA: 2.1 ng/mL (ref 0.0–4.0)

## 2022-09-06 ENCOUNTER — Ambulatory Visit: Payer: MEDICARE | Attending: Cardiovascular Disease | Primary: Family

## 2022-09-06 DIAGNOSIS — Z950 Presence of cardiac pacemaker: Secondary | ICD-10-CM

## 2022-11-10 NOTE — Progress Notes (Signed)
Assessment/Plan    ICD-10-CM ICD-9-CM    1. Acute pain of right knee  M25.561 719.46       2. Pain and swelling of right knee  M25.561 719.46     M25.461 719.06           MEDICAL DECISION MAKING:  Pt c/o R knee pain x 2 days; Assoc sx include warmth, redness, swelling. States he slipped out a low chair he was sitting in and he rolled over onto his R knee with all his weight on the R knee and ever since, the knee has been "crazy". Describes pain and "like theres glass in there." States he has a L knee replacement and was recommended to get the R knee replaced but has not. He denies hx of gout/pseudogout. There was no abrasion/scrapes of the skin after rolling onto the knee. Pt denies elevation, ice, and OTC medications.  Pt is afebrile and NAD. There is swelling, warmth, and erythema of the R knee; tenderness along the medial and lateral joint line. There is resolving bruising with no skin interruption or abrasions. Pt is able to WB on the R knee with pain. Will order R knee XR; if there are no acute findings, pt will call his PCP for F/U and further imaging. DDx fx, gout, pseudogout; low suspicion for septic joint. Pt is on Eloquis; cannot take NSAIDs and just finished prednisone that was prescribed by his PCP for cough/COVID last week. At this time, will elevate, ice and take APAP. Pt to proceed to there ER for any concerning sx such as severe pain and change in sensation.     COUNSELING:  Reviewed medication SE, indications, risks, and instructions. I spoke with the patient in appropriate terminology of our work-up in the urgent care, discussed today's findings and their diagnosis, in addition to providing specific details for the plan of care. I have also given anticipatory guidance and expectant management of their condition. Questions are answered and there is agreement with the plan; patient given AVS at discharge.They are instructed to FU with their PCP or go to the ED if their condition changes or worsens in  any way.    Patient discharged home in stable condition.     Subjective  CHIEF COMPLAINT:  Chief Complaint   Patient presents with   . Knee Pain     NURSING NOTES:  Nursing Notes:   Sharyon Cable, LPN  69/62/95 [id number]  Signed  89 yo male here today c/o right knee pain. Pt states he was sitting in his desk chair and went to the floor. As he was attempting to get up he put weight on his knee and stated, "my knee went crazy." Pt unsure if he felt any pop or crunch. Pt states happened 2 days ago. Pt states immediately swelled.      HISTORY OF PRESENT ILLNESS:  Dalton Warner is a 89 year old male who presents today with the following concerns:  Pt c/o R knee pain x 2 days; Assoc sx include warmth, redness, swelling. States he slipped out a low chair he was sitting in and he rolled over onto his R knee with all his weight on the R knee and ever since, the knee has been "crazy". Describes pain and "like there's glass in there." States he has a L knee replacement and was recommended to get the R knee replaced but has not. He denies hx of gout/pseudogout. There was no abrasion/scrapes of the skin after rolling onto  the knee. Pt denies elevation, ice, and OTC medications.      PAST MEDICAL HISTORY:  Past Medical History:   Diagnosis Date   . Allergic rhinitis    . Anemia    . BPH (benign prostatic hyperplasia)    . Gastroesophageal reflux disease    . Hypercholesteremia         CURRENT MEDICATIONS:    Current Outpatient Medications:   .  doxycycline hyclate 100 mg tablet, Take 100 mg by mouth as needed, Disp: , Rfl:   .  alfuzosin (UROXATRAL) 10 mg SR-tablet 24 Hr, , Disp: , Rfl:   .  apixaban (ELIQUIS) 2.5 mg tablet, , Disp: , Rfl:   .  AtorvaSTATin (LIPITOR) 40 mg tablet, , Disp: , Rfl:   .  cetirizine (ZYRTEC) 10 mg tablet, , Disp: , Rfl:   .  cyanocobalamin (VITAMIN B12) 1,000 mcg tablet, 1,000 mcg, Disp: , Rfl:   .  ferrous sulfate 324 mg (65 mg iron) EC-tablet, 324 mg, Disp: , Rfl:   .  metoprolol SUCCINATE (TOPROL-XL)  25 mg SR-tablet 24 Hr, Take 25 mg by mouth, Disp: , Rfl:   .  pantoprazole (PROTONIX) 40 mg enteric-coated tablet, , Disp: , Rfl:   .  albuterol sulfate 90 mcg/actuation Aer. Pow BA, 2 Puffs every 6 hours as needed, Disp: , Rfl:   .  benzonatate (TESSALON PERLE) 100 mg capsule, Take 1 Cap by mouth three times a day (Patient not taking: Reported on 11/10/2022), Disp: 21 Cap, Rfl: 0  .  triamcinolone acetonide (KENALOG) 0.1 % topical cream, , Disp: , Rfl:   .  ammonium lactate (LAC-HYDRIN) 12 % topical cream, , Disp: , Rfl:   .  guaiFENesin SR (MUCINEX) 600 mg tablet, 600 mg (Patient not taking: Reported on 11/10/2022), Disp: , Rfl:   .  ketoconazole (NIZORAL) 2 % topical cream, , Disp: , Rfl:   .  montelukast (SINGULAIR) 10 mg tablet, , Disp: , Rfl:      ALLERGIES:   Allergenic extracts, Cat dander, Fish allergy, and Fish containing products    Objective  PHYSICAL EXAM:   Nursing notes and vital signs documented and reviewed.  VITAL SIGNS:  Visit Vitals  BP 118/77   Pulse 69   Temp 98.8 F (37.1 C)   SpO2 97%          Physical Exam  Musculoskeletal:        Legs:         Constitutional:  NAD, non-toxic appearance; well-nourished, appropriately dressed, cooperative  HEENT: Head: Normocephalic, bruising of the R forehead extending to the cheek;  Eyes: conjunctiva normal, white sclera, no d/c; Nose: No nasal drainage  Neck: Normal ROM, supple, no tenderness trachea midline  Pulmonary/Chest:   No respiratory distress  Neurologic/Psychiatric:  Alert & oriented x 3; No focal deficits, fluent speech with clear words, using motorized chair    Radiology / Procedures:  No orders to display       In-clinic POC testing:  No results found for this visit on 11/10/22.    I have reviewed the nursing note as well as documented past medical, surgical, family, and social history sections including the medications and allergies listed in the above medical record except as noted.         There are no Patient Instructions on file for this  visit.    The patient was given an After Visit Summary sheet.    Baldwin Jamaica, PA-C  [facility]  [phone] [place] [address]  [phone]    This note is electronically signed in the electronic medical record.

## 2022-12-09 ENCOUNTER — Encounter: Admit: 2022-12-09 | Discharge: 2022-12-09 | Payer: MEDICARE | Attending: Internal Medicine | Primary: Family

## 2022-12-09 DIAGNOSIS — I10 Essential (primary) hypertension: Secondary | ICD-10-CM

## 2022-12-09 NOTE — Progress Notes (Signed)
Dalton Hack, MD                                  CARDIOLOGY  NOTE         Chief Complaint:    Chief Complaint   Patient presents with    6 Month Follow-Up     Pt denies any new cardiac sx states SOB is the same     Shortness of Breath      + FALLS       Echo: 09/19/2019    Left ventricular function is normal, EF is estimated at 55-60%.   Mild left ventricular hypertrophy.   Diastolic dysfunction could not be evaluated due to arrhythmia.   Abnormal (paradoxical) motion consistent with RV pacemaker.   No regional wall motion abnormalities were detected.   Bi atrial enlargement noted.   Sclerotic, but non-stenotic aortic valve.   Mitral annular calcification is present.   Moderate mitral, aortic, tricuspid and mild pulmonic regurgitation is   present.   Mild pulmonary hypertension with an RVSP of .   Dilation of the aortic root(4.0) and the ascending aorta(4.2).   No evidence of pericardial effusion.      Prior HPI:     Dalton Warner is a 89 y.o. year old male who was seen in the hospital for chronic atrial fibrillation.  Patient has history of chronic thrombocytopenia.  He has recently moved into town to take care of his wife.  Patient also has a history of LV thrombus in the past.    Chronic atrial fibrillation  CHA2DS2-VASc score is 6     EKG: Atrial fibrillation at 66 bpm      Current Outpatient Medications   Medication Sig Dispense Refill    SYSTANE 0.4-0.3 % ophthalmic solution       apixaban (ELIQUIS) 2.5 MG TABS tablet Take 1 tablet by mouth 2 times daily 180 tablet 3    vitamin B-12 (CYANOCOBALAMIN) 1000 MCG tablet Take 1 tablet by mouth daily      ketoconazole (NIZORAL) 2 % shampoo WASH SCALP EVERY OTHER DAY LEAVE ON 3-5 MINUTES BEFORE RINSING      ketoconazole (NIZORAL) 2 % cream APPLY TOPICALLY TO FACE AND NECK EVERY DAY AS NEEDED WHILE FLARING      ammonium lactate (AMLACTIN) 12 % cream       cetirizine (ZYRTEC) 10 MG tablet Take 1 tablet by mouth daily      pantoprazole (PROTONIX) 40 MG tablet Take 1  tablet by mouth 2 times daily (before meals) 60 tablet 2    guaiFENesin (MUCINEX) 600 MG extended release tablet Take 2 tablets by mouth 2 times daily      alfuzosin (UROXATRAL) 10 MG extended release tablet Take 1 tablet by mouth daily      atorvastatin (LIPITOR) 40 MG tablet Take 1 tablet by mouth daily      metoprolol succinate (TOPROL XL) 50 MG extended release tablet Take 1 tablet by mouth nightly      montelukast (SINGULAIR) 10 MG tablet 1 tablet nightly      albuterol sulfate (PROAIR RESPICLICK) 108 (90 Base) MCG/ACT aerosol powder inhalation 2 puffs every 6 hours as needed       No current facility-administered medications for this visit.       Allergies:     Cat hair extract, Dust mite extract, Other, and Fish allergy    Patient History:  Past Medical History:   Diagnosis Date    Arthritis     Atrial fibrillation (HCC)     Bleeding ulcer 08/2018    Cancer [facility])     Prostate cancer dx March 2010- tx with radiation     Colon cancer [facility]) 2000    per old chart dx with colon ca - tx with surgery only    Diabetes mellitus (HCC)     "was borderline- at one time was on Metformin for several yrs-  when I went to [facility] they had me stop this in 2017"    Fracture     "had broken neck in Nov 2016- wore a collar for 6 weeks only"    HOH (hard of hearing)     bil hearing aide    Hx of blood clots     "had blood clot - it was a  blood clot in  my esophagus "    Hx of Doppler ultrasound 09/19/2019    EF IS 55-60%  Bi atrial enlargement noted.  Moderate mitral, aortic, tricuspid and mild pulmonic regurgitation is present    Hx of fall     "last fall 2016- use cane walker and have electric chair"    Hyperlipidemia     Hypertension     follow with Dr Maryclare Labrador at [facility]    Limited range of motion (ROM) of shoulder     "right shoulder haved 10 % usage and left shoulder 60 % of usage"    Neuropathy     both feet    Sleep apnea     "had sleep study- tried to give me cpap but could not tolerate it "    TIA  (transient ischemic attack)     'in Jan 2009- trouble with speech, numbness of lip, weakness right arm - all only lasted for 20 seconds"    Unsteady gait     "hx of back problem- was on steroids for 4 yrs- since then stability issues with my back -     Wears glasses     to read      Past Surgical History:   Procedure Laterality Date    ABDOMEN SURGERY  2000    per old chart had right hemicolectomy "removed 2 and a half feet of colon and my appendix"    COLONOSCOPY  2019    CT BONE MARROW BIOPSY  11/02/2016    CT BONE MARROW BIOPSY 11/02/2016    ENDOSCOPY, COLON, DIAGNOSTIC      per old chart had EGD done 08/2018 and 09/2018    HERNIA REPAIR      per old chart open left ing hernia 1980 and then recurrent left ing hernia repair 08/2018    JOINT REPLACEMENT      per old chart total left knee 2018    PACEMAKER PLACEMENT      per old chart hx of st jude pacer insertion 2008- new pacer inserted 2019    SHOULDER SURGERY Left 2015    rota. cuff    SHOULDER SURGERY Right 02/27/2019    RIGHT REVERSE SHOULDER TOTAL ARTHROPLASTY REVERSE performed by Gracelyn Nurse, MD at [facility]     Family History   Problem Relation Age of Onset    Cancer Mother         lung cancer    Diabetes Mother     Diabetes Sister     Cancer Brother     High Blood Pressure Brother  Social History     Tobacco Use    Smoking status: Never    Smokeless tobacco: Never   Substance Use Topics    Alcohol use: Yes     Comment: "maybe 6 times per year" "when had prostate cancer did drink one glass red wine daily in the past  1cup coffee              Objective:      Physical Exam:    BP 106/60 (Site: Left Upper Arm, Position: Sitting, Cuff Size: Medium Adult)   Pulse 66   Ht 1.702 m (5\' 7" )   Wt 75.8 kg (167 lb)   BMI 26.16 kg/m   Wt Readings from Last 3 Encounters:   12/09/22 75.8 kg (167 lb)   08/05/22 76.7 kg (169 lb)   06/14/22 78 kg (172 lb)     Body mass index is 26.16 kg/m.  Vitals:    12/09/22 1232   BP: 106/60   Pulse: 66        General Appearance  and Constitutional: Conversant, Well developed, Well nourished, No acute distress, Non-toxic appearance.   HEENT:  Normocephalic, Atraumatic, Bilateral external ears normal, Oropharynx moist, No oral exudates,   Nose normal.   Neck- Normal range of motion, No tenderness, Supple  Eyes:  EOMI, Conjunctiva normal, No discharge.   Respiratory:  Normal breath sounds, No respiratory distress, No wheezing, No Rales, No Ronchi.  No chest tenderness.   Cardiovascular: S1-S2 IRIR, no added heart sounds, No Mumurs appreciated. No gallops, rubs. No Pedal Edema   GI:  Bowel sounds normal, Soft, No tenderness,  GU:  No costovertebral angle tenderness   Musculoskeletal:  No gross deformities. Back- No tenderness  Integument:  Well hydrated, no rash   Lymphatic:  No lymphadenopathy noted   Neurologic:  Alert & oriented x 3, Normal motor function, normal sensory function, no focal deficits noted   Psychiatric:  Speech and behavior appropriate       Medical decision making and Data review:    DATA:    Lab Results   Component Value Date    TROPONINT <0.010 03/26/2021     BNP:    Lab Results   Component Value Date    PROBNP 2,032 (H) 01/13/2020     PT/INR:  No results found for: "PTINR"  Lab Results   Component Value Date    LABA1C 5.8 02/26/2021    LABA1C 6.1 09/29/2020     Lab Results   Component Value Date    CHOL 80 02/26/2021    TRIG 125 02/26/2021    HDL 36 (L) 02/26/2021    LDLDIRECT 37 02/26/2021     Lab Results   Component Value Date    WBC 7.1 07/29/2022    HGB 11.1 (L) 07/29/2022    HCT 35.3 (L) 07/29/2022    MCV 87.2 07/29/2022    PLT 86 (L) 07/29/2022     TSH: No results found for: "TSH"  Lab Results   Component Value Date    AST 18 07/29/2022    ALT 12 07/29/2022    BILITOT 0.9 07/29/2022    ALKPHOS 74 07/29/2022         All labs, medications and tests reviewed by myself including data and history from outside source , patient and available family .      1. Essential hypertension    2. Permanent atrial fibrillation (HCC)     3. Dyslipidemia    4. Pacemaker  5. SSS (sick sinus syndrome) (HCC)    6. Mural thrombus of left ventricle             Impression and Plan:    Chronic atrial fibrillation, with history of TIA as well as hx of LV thrombus.  CHA2DS2-VASc score is > 6. on low-dose Eliquis understanding high risk of ischemic stroke versus bleeding risk. Cont BB  - No LV thrombus noted on last Echo     Anemia as well as thrombocytopenia ( Follows with Dr. Guss Bunde) last hemoglobin 11.1.  Stable.  On Iron supplements.  Can be observed on low-dose OAC  Recurrent falls      Patient has had recurrent falls attributed to poor balance lately, including small facial trauma and bruising.  I discussed in detail about left atrial appendage closure devices  Patient would like to think over it and will be back in a month to discuss further  In the meanwhile Continue Eliquis 2.5 mg p.o. twice daily    History of GI bleeding: Follows up with Dr. Reather Converse -no acute bleeding lately.    Hyperlipidemia: Cont lipitor 40 mg daily      HTN: Cont Metoprolol 50 mg po daily, BP well controlled     Hyperlipidemia: Continue with Lipitor 40 mg daily    SSS s.p PPM . VVIR - Follow up with pacer clinic as outpt       Return in about 1 month (around 01/08/2023).          Counseled extensively and medication compliance urged.  We discussed that for the  prevention of ASCVD our  goal is aggressive risk modification.Patient is encouraged to exercise even a brisk walk for 30 minutes  at least 3 to 4 times a week   Various goals were discussed and questions answered. Continue current medications. Appropriate prescriptions are addressed and refills ordered.  Questions answered and patient verbalizes understanding.  Call for any problems, questions, or concerns.

## 2022-12-13 ENCOUNTER — Ambulatory Visit: Admit: 2022-12-13 | Discharge: 2022-12-13 | Payer: MEDICARE | Attending: Pulmonary Disease | Primary: Family

## 2022-12-13 DIAGNOSIS — J4530 Mild persistent asthma, uncomplicated: Secondary | ICD-10-CM

## 2022-12-13 NOTE — Progress Notes (Signed)
Subjective:   CHIEF COMPLAINT / HPI:  doing well      Past Medical History:  Past Medical History:   Diagnosis Date    Arthritis     Atrial fibrillation (HCC)     Bleeding ulcer 08/2018    Cancer [facility])     Prostate cancer dx March 2010- tx with radiation     Colon cancer [facility]) 2000    per old chart dx with colon ca - tx with surgery only    Diabetes mellitus (HCC)     "was borderline- at one time was on Metformin for several yrs-  when I went to [facility] they had me stop this in 2017"    Fracture     "had broken neck in Nov 2016- wore a collar for 6 weeks only"    HOH (hard of hearing)     bil hearing aide    Hx of blood clots     "had blood clot - it was a  blood clot in  my esophagus "    Hx of Doppler ultrasound 09/19/2019    EF IS 55-60%  Bi atrial enlargement noted.  Moderate mitral, aortic, tricuspid and mild pulmonic regurgitation is present    Hx of fall     "last fall 2016- use cane walker and have electric chair"    Hyperlipidemia     Hypertension     follow with Dr Maryclare Labrador at [facility]    Limited range of motion (ROM) of shoulder     "right shoulder haved 10 % usage and left shoulder 60 % of usage"    Neuropathy     both feet    Sleep apnea     "had sleep study- tried to give me cpap but could not tolerate it "    TIA (transient ischemic attack)     'in Jan 2009- trouble with speech, numbness of lip, weakness right arm - all only lasted for 20 seconds"    Unsteady gait     "hx of back problem- was on steroids for 4 yrs- since then stability issues with my back -     Wears glasses     to read        Past Surgical History:        Procedure Laterality Date    ABDOMEN SURGERY  2000    per old chart had right hemicolectomy "removed 2 and a half feet of colon and my appendix"    COLONOSCOPY  2019    CT BONE MARROW BIOPSY  11/02/2016    CT BONE MARROW BIOPSY 11/02/2016    ENDOSCOPY, COLON, DIAGNOSTIC      per old chart had EGD done 08/2018 and 09/2018    HERNIA REPAIR      per old chart open left ing  hernia 1980 and then recurrent left ing hernia repair 08/2018    JOINT REPLACEMENT      per old chart total left knee 2018    PACEMAKER PLACEMENT      per old chart hx of st jude pacer insertion 2008- new pacer inserted 2019    SHOULDER SURGERY Left 2015    rota. cuff    SHOULDER SURGERY Right 02/27/2019    RIGHT REVERSE SHOULDER TOTAL ARTHROPLASTY REVERSE performed by Gracelyn Nurse, MD at [facility] OR       Current Medications:    No current facility-administered medications for this visit.    Allergies   Allergen Reactions  Cat Hair Extract Other (See Comments)     Runny nose    Dust Mite Extract Other (See Comments)     Runny nose    Other      Other reaction(s): Other (See Comments)  Dust/cats - sneezing    Fish Allergy Nausea And Vomiting     Salmon only  Salmon only       Social History:    Social History     Socioeconomic History    Marital status: Widowed     Spouse name: None    Number of children: None    Years of education: None    Highest education level: None   Tobacco Use    Smoking status: Never    Smokeless tobacco: Never   Vaping Use    Vaping status: Never Used   Substance and Sexual Activity    Alcohol use: Yes     Comment: "maybe 6 times per year" "when had prostate cancer did drink one glass red wine daily in the past  1cup coffee    Drug use: Never     Social Determinants of Health     Food Insecurity: No Food Insecurity (12/14/2018)    Received from [place]    Hunger Vital Sign     Worried About Programme researcher, broadcasting/film/video in the Last Year: Never true     Ran Out of Food in the Last Year: Never true       Family History:   Family History   Problem Relation Age of Onset    Cancer Mother         lung cancer    Diabetes Mother     Diabetes Sister     Cancer Brother     High Blood Pressure Brother        Immunization:  Immunization History   Administered Date(s) Administered    COVID-19, MODERNA BLUE border, Primary or Immunocompromised, (age 12y+), IM, 100 mcg/0.33mL 02/19/2019, 04/03/2019,  12/18/2019, 05/22/2020    Influenza A (H1N1-09) Vaccine PF IM 01/26/2008    Influenza Virus Vaccine 11/09/2010, 11/09/2011, 11/22/2012, 10/09/2013, 10/27/2014, 02/09/2015, 10/12/2015, 10/16/2015, 10/18/2015, 02/08/2017, 10/24/2018, 10/07/2020    Influenza, FLUAD, (age 50 y+), IM, Quadv, 0.50mL 10/25/2018, 11/05/2019, 10/06/2021    Influenza, FLUAD, (age 51 y+), IM, Trivalent PF, 0.46mL 10/31/2017    Influenza, FLUZONE High Dose, (age 66 y+), IM, Trivalent PF, 0.29mL 10/17/2015, 10/20/2016, 11/07/2016    Pneumococcal Vaccine 11/08/1997, 11/09/2010    Pneumococcal, PCV-13, PREVNAR 13, (age 6w+), IM, 0.55mL 11/09/2010, 07/17/2015    Pneumococcal, PPSV23, PNEUMOVAX 23, (age 2y+), SC/IM, 0.78mL 10/28/2013, 12/04/2014, 01/12/2017    TDaP, ADACEL (age 56y-64y), BOOSTRIX (age 10y+), IM, 0.26mL 01/09/2008, 02/09/2012, 12/14/2012, 04/13/2018    Td vaccine (adult) 02/08/1993, 12/21/2006, 12/14/2012    Tetanus 12/21/2006    Zoster Live (Zostavax) 12/09/2005    Zoster Recombinant (Shingrix) 07/06/2017, 09/08/2017         REVIEW OF SYSTEMS:    CONSTITUTIONAL:  negative for fevers, chills, diaphoresis, activity change, appetite change, fatigue, night sweats and unexpected weight change.   EYES:  negative for blurred vision, eye discharge, visual disturbance and icterus  HEENT:  negative for hearing loss, tinnitus, ear drainage, sinus pressure, nasal congestion, epistaxis and snoring  RESPIRATORY:  See HPI  CARDIOVASCULAR:  negative for chest pain, palpitations, exertional chest pressure/discomfort, edema, syncope  GASTROINTESTINAL:  negative for nausea, vomiting, diarrhea, constipation, blood in stool and abdominal pain  GENITOURINARY:  negative for frequency, dysuria and hematuria  HEMATOLOGIC/LYMPHATIC:  negative for easy bruising, bleeding and lymphadenopathy  ALLERGIC/IMMUNOLOGIC:  negative for recurrent infections, angioedema, anaphylaxis and drug reactions  ENDOCRINE:  negative for weight changes and diabetic symptoms including  polyuria, polydipsia and polyphagia    MUSCULOSKELETAL:  negative for  pain, joint swelling, decreased range of motion and muscle weakness  NEUROLOGICAL:  negative for headaches, slurred speech, unilateral weakness  PSYCHIATRIC/BEHAVIORAL: negative for hallucinations, behavioral problems, confusion and agitation.     Objective:   PHYSICAL EXAM:      VITALS:    Vitals:    12/13/22 0911   Pulse: 76   Resp: 16   SpO2: 97%   Weight: 75.8 kg (167 lb)   Height: 1.702 m (5\' 7" )         CONSTITUTIONAL:  awake, alert, cooperative, no apparent distress, and appears stated age  NECK:  Supple, symmetrical, trachea midline, no adenopathy, thyroid symmetric, not enlarged and no tenderness  CHEST: Chest expansion equal and symmetrical, no intercostal retraction.  LUNGS:  no increased work of breathing, has expiratory wheezes both lungs, no crackles.  CARDIOVASCULAR: S1 and S2, no edema and no JVD  ABDOMEN:  normal bowel sounds, non-distended and no masses palpated, and no tenderness to palpation. No hepatospleenomegaly  LYMPHADENOPATHY:  no axillary or supraclavicular adenopathy. No cervical adnenopathy  PSYCHIATRIC: Oriented to person place and time. No obvious depression or anxiety.  MUSCULOSKELETAL: No obvious misalignment or effusion of the joints. No clubbing, cyanosis of the digits.  RIGHT AND LEFT LOWER EXTREMITIES: No edema, no inflammation, no tenderness.  SKIN:  normal skin color, texture, turgor and no redness, warmth, or swelling. No palpable nodules    DATA:                        Assessment:     Mild asthma          Plan:     D/w pt  Cpm  No refill needed  Rtc 6 months

## 2023-01-13 ENCOUNTER — Encounter: Admit: 2023-01-13 | Payer: MEDICARE | Admitting: Internal Medicine | Primary: Family

## 2023-01-13 VITALS — BP 122/72 | HR 64 | Ht 67.0 in | Wt 168.0 lb

## 2023-01-13 DIAGNOSIS — I4821 Permanent atrial fibrillation: Principal | ICD-10-CM

## 2023-01-13 MED ORDER — APIXABAN 2.5 MG PO TABS
2.5 | ORAL_TABLET | Freq: Two times a day (BID) | ORAL | 3 refills | Status: AC
Start: 2023-01-13 — End: ?

## 2023-01-13 MED ORDER — METOPROLOL SUCCINATE ER 50 MG PO TB24
50 | ORAL_TABLET | Freq: Every evening | ORAL | 3 refills | 90.00000 days | Status: DC
Start: 2023-01-13 — End: 2023-12-22

## 2023-01-13 MED ORDER — ATORVASTATIN CALCIUM 40 MG PO TABS
40 | ORAL_TABLET | Freq: Every day | ORAL | 5 refills | Status: AC
Start: 2023-01-13 — End: ?

## 2023-01-13 NOTE — Progress Notes (Signed)
 Dalton Hack, MD                                  CARDIOLOGY  NOTE         Chief Complaint:    Chief Complaint   Patient presents with    1 Month Follow-Up     Pt denies any cardiac sx          No recurrent fall   AF       Echo: 09/19/2019    Left vent

## 2023-05-06 ENCOUNTER — Inpatient Hospital Stay: Payer: MEDICARE | Primary: Family

## 2023-05-06 DIAGNOSIS — D649 Anemia, unspecified: Secondary | ICD-10-CM

## 2023-05-06 LAB — IRON AND TIBC
Iron % Saturation: 30 % (ref 15–50)
Iron: 87 ug/dL (ref 59–158)
TIBC: 293 ug/dL (ref 260–445)
UIBC: 206 ug/dL (ref 110–370)

## 2023-05-06 LAB — COMPREHENSIVE METABOLIC PANEL
ALT: 15 U/L (ref 10–40)
AST: 20 U/L (ref 15–37)
Albumin/Globulin Ratio: 2.2 (ref 1.1–2.2)
Albumin: 4.3 g/dL (ref 3.4–5.0)
Alkaline Phosphatase: 71 U/L (ref 40–129)
Anion Gap: 13 mmol/L (ref 9–17)
BUN: 17 mg/dL (ref 7–20)
CO2: 23 mmol/L (ref 21–32)
Calcium: 8.9 mg/dL (ref 8.3–10.6)
Chloride: 99 mmol/L (ref 99–110)
Creatinine: 1 mg/dL (ref 0.8–1.3)
Est, Glom Filt Rate: 69 mL/min/{1.73_m2} (ref >60)
Glucose: 127 mg/dL — ABNORMAL HIGH (ref 74–99)
Potassium: 4.7 mmol/L (ref 3.5–5.1)
Sodium: 135 mmol/L — ABNORMAL LOW (ref 136–145)
Total Bilirubin: 0.7 mg/dL (ref 0.0–1.0)
Total Protein: 6.3 g/dL — ABNORMAL LOW (ref 6.4–8.2)

## 2023-05-06 LAB — RETICULOCYTES
Absolute Retic #: 0.085 M/uL
Retic Ct Pct: 2.1 % — ABNORMAL HIGH (ref 0.2–2.0)

## 2023-05-06 LAB — CBC WITH AUTO DIFFERENTIAL
Basophils %: 1 % (ref 0–1)
Basophils Absolute: 0.06 10*3/uL
Eosinophils %: 1 % (ref 0.0–3.0)
Eosinophils Absolute: 0.09 10*3/uL
Hematocrit: 33.9 % — ABNORMAL LOW (ref 42.0–52.0)
Hemoglobin: 11 g/dL — ABNORMAL LOW (ref 13.5–18.0)
Immature Granulocytes %: 2 % — ABNORMAL HIGH
Immature Granulocytes Absolute: 0.1 10*3/uL
Lymphocytes %: 30 % (ref 24–44)
Lymphocytes Absolute: 1.89 10*3/uL
MCH: 27.5 pg (ref 27.0–31.0)
MCHC: 32.4 g/dL (ref 32.0–36.0)
MCV: 84.8 fL (ref 78.0–100.0)
Monocytes %: 9 % — ABNORMAL HIGH (ref 0–4)
Monocytes Absolute: 0.55 10*3/uL
Neutrophils %: 58 % (ref 36–66)
Neutrophils Absolute: 3.72 10*3/uL
Platelet, Fluorescence: 82 10*3/uL — ABNORMAL LOW (ref 140–440)
RBC: 4 m/uL — ABNORMAL LOW (ref 4.60–6.20)
RDW: 20.6 % — ABNORMAL HIGH (ref 11.7–14.9)
WBC: 6.4 10*3/uL (ref 4.0–10.5)

## 2023-05-06 LAB — FOLATE: Folate: 7.9 ng/mL (ref 4.8–24.2)

## 2023-05-06 LAB — VITAMIN B12: Vitamin B-12: 426 pg/mL (ref 211–911)

## 2023-05-06 LAB — FERRITIN: Ferritin: 109 ng/mL (ref 30–400)

## 2023-05-06 LAB — ELECTROPHORESIS PROTEIN, SERUM: Total Protein: 6.3 g/dL

## 2023-05-14 LAB — MISCELLANEOUS SENDOUT 4: Test Name: 2012572

## 2023-06-13 ENCOUNTER — Ambulatory Visit: Admit: 2023-06-13 | Discharge: 2023-06-13 | Payer: MEDICARE | Attending: Pulmonary Disease | Primary: Family

## 2023-06-13 VITALS — HR 70 | Resp 18 | Ht 67.0 in | Wt 167.0 lb

## 2023-06-13 DIAGNOSIS — J4530 Mild persistent asthma, uncomplicated: Secondary | ICD-10-CM

## 2023-06-13 NOTE — Progress Notes (Signed)
 Subjective:   CHIEF COMPLAINT / HPI:  no cough no sob      Past Medical History:  Past Medical History:   Diagnosis Date    Arthritis     Atrial fibrillation (HCC)     Bleeding ulcer 08/2018    Cancer [facility])     Prostate cancer dx March 2010- tx with radiation     Colon cancer [facility]) 2000    per old chart dx with colon ca - tx with surgery only    Diabetes mellitus (HCC)     "was borderline- at one time was on Metformin for several yrs-  when I went to [facility] they had me stop this in 2017"    Fracture     "had broken neck in Nov 2016- wore a collar for 6 weeks only"    HOH (hard of hearing)     bil hearing aide    Hx of blood clots     "had blood clot - it was a  blood clot in  my esophagus "    Hx of Doppler ultrasound 09/19/2019    EF IS 55-60%  Bi atrial enlargement noted.  Moderate mitral, aortic, tricuspid and mild pulmonic regurgitation is present    Hx of fall     "last fall 2016- use cane walker and have electric chair"    Hyperlipidemia     Hypertension     follow with Dr Providence Brow at [facility]    Limited range of motion (ROM) of shoulder     "right shoulder haved 10 % usage and left shoulder 60 % of usage"    Neuropathy     both feet    Sleep apnea     "had sleep study- tried to give me cpap but could not tolerate it "    TIA (transient ischemic attack)     'in Jan 2009- trouble with speech, numbness of lip, weakness right arm - all only lasted for 20 seconds"    Unsteady gait     "hx of back problem- was on steroids for 4 yrs- since then stability issues with my back -     Wears glasses     to read        Past Surgical History:        Procedure Laterality Date    ABDOMEN SURGERY  2000    per old chart had right hemicolectomy "removed 2 and a half feet of colon and my appendix"    COLONOSCOPY  2019    CT BIOPSY BONE MARROW  11/02/2016    CT BONE MARROW BIOPSY 11/02/2016    ENDOSCOPY, COLON, DIAGNOSTIC      per old chart had EGD done 08/2018 and 09/2018    HERNIA REPAIR      per old chart open left ing  hernia 1980 and then recurrent left ing hernia repair 08/2018    JOINT REPLACEMENT      per old chart total left knee 2018    PACEMAKER PLACEMENT      per old chart hx of st jude pacer insertion 2008- new pacer inserted 2019    SHOULDER SURGERY Left 2015    rota. cuff    SHOULDER SURGERY Right 02/27/2019    RIGHT REVERSE SHOULDER TOTAL ARTHROPLASTY REVERSE performed by Terresa Ferry, MD at [facility] OR       Current Medications:    No current facility-administered medications for this visit.    Allergies   Allergen  Reactions    Cat Dander Other (See Comments)     Runny nose    Dust Mite Extract Other (See Comments)     Runny nose    Other      Other reaction(s): Other (See Comments)  Dust/cats - sneezing    Fish Allergy Nausea And Vomiting     Salmon only  Salmon only       Social History:    Social History     Socioeconomic History    Marital status: Widowed   Tobacco Use    Smoking status: Never    Smokeless tobacco: Never   Vaping Use    Vaping status: Never Used   Substance and Sexual Activity    Alcohol use: Yes     Comment: "maybe 6 times per year" "when had prostate cancer did drink one glass red wine daily in the past  1cup coffee    Drug use: Never     Social Drivers of Health     Food Insecurity: No Food Insecurity (12/14/2018)    Received from [place]    Hunger Vital Sign     Worried About Programme researcher, broadcasting/film/video in the Last Year: Never true     Ran Out of Food in the Last Year: Never true       Family History:   Family History   Problem Relation Age of Onset    Cancer Mother         lung cancer    Diabetes Mother     Diabetes Sister     Cancer Brother     High Blood Pressure Brother        Immunization:  Immunization History   Administered Date(s) Administered    COVID-19, MODERNA BLUE border, Primary or Immunocompromised, (age 12y+), IM, 100 mcg/0.66mL 02/19/2019, 04/03/2019, 12/18/2019, 05/22/2020    Influenza A (H1N1-09) Vaccine PF IM 01/26/2008    Influenza Virus Vaccine 11/09/2010,  11/09/2011, 11/22/2012, 10/09/2013, 10/27/2014, 02/09/2015, 10/12/2015, 10/16/2015, 10/18/2015, 02/08/2017, 10/24/2018, 10/07/2020    Influenza, FLUAD, (age 43 y+), IM, Quadv, 0.52mL 10/25/2018, 11/05/2019, 10/06/2021    Influenza, FLUAD, (age 75 y+), IM, Trivalent PF, 0.81mL 10/31/2017    Influenza, FLUZONE High Dose, (age 55 y+), IM, Trivalent PF, 0.48mL 10/17/2015, 10/20/2016, 11/07/2016    Pneumococcal Vaccine 11/08/1997, 11/09/2010    Pneumococcal, PCV-13, PREVNAR 13, (age 6w+), IM, 0.53mL 11/09/2010, 07/17/2015    Pneumococcal, PPSV23, PNEUMOVAX 23, (age 2y+), SC/IM, 0.68mL 10/28/2013, 12/04/2014, 01/12/2017    TDaP, ADACEL (age 66y-64y), BOOSTRIX (age 10y+), IM, 0.15mL 01/09/2008, 02/09/2012, 12/14/2012, 04/13/2018    Td vaccine (adult) 02/08/1993, 12/21/2006, 12/14/2012    Tetanus 12/21/2006    Zoster Live (Zostavax) 12/09/2005    Zoster Recombinant (Shingrix) 07/06/2017, 09/08/2017         REVIEW OF SYSTEMS:    CONSTITUTIONAL:  negative for fevers, chills, diaphoresis, activity change, appetite change, fatigue, night sweats and unexpected weight change.   EYES:  negative for blurred vision, eye discharge, visual disturbance and icterus  HEENT:  negative for hearing loss, tinnitus, ear drainage, sinus pressure, nasal congestion, epistaxis and snoring  RESPIRATORY:  See HPI  CARDIOVASCULAR:  negative for chest pain, palpitations, exertional chest pressure/discomfort, edema, syncope  GASTROINTESTINAL:  negative for nausea, vomiting, diarrhea, constipation, blood in stool and abdominal pain  GENITOURINARY:  negative for frequency, dysuria and hematuria  HEMATOLOGIC/LYMPHATIC:  negative for easy bruising, bleeding and lymphadenopathy  ALLERGIC/IMMUNOLOGIC:  negative for recurrent infections, angioedema, anaphylaxis and drug reactions  ENDOCRINE:  negative  for weight changes and diabetic symptoms including polyuria, polydipsia and polyphagia    MUSCULOSKELETAL:  negative for  pain, joint swelling, decreased range of  motion and muscle weakness  NEUROLOGICAL:  negative for headaches, slurred speech, unilateral weakness  PSYCHIATRIC/BEHAVIORAL: negative for hallucinations, behavioral problems, confusion and agitation.     Objective:   PHYSICAL EXAM:      VITALS:    Vitals:    06/13/23 0904   Pulse: 70   Resp: 18   SpO2: 97%   Weight: 75.8 kg (167 lb)   Height: 1.702 m (5\' 7" )         CONSTITUTIONAL:  awake, alert, cooperative, no apparent distress, and appears stated age  NECK:  Supple, symmetrical, trachea midline, no adenopathy, thyroid symmetric, not enlarged and no tenderness  CHEST: Chest expansion equal and symmetrical, no intercostal retraction.  LUNGS:  no increased work of breathing, has expiratory wheezes both lungs, no crackles.  CARDIOVASCULAR: S1 and S2, no edema and no JVD  ABDOMEN:  normal bowel sounds, non-distended and no masses palpated, and no tenderness to palpation. No hepatospleenomegaly  LYMPHADENOPATHY:  no axillary or supraclavicular adenopathy. No cervical adnenopathy  PSYCHIATRIC: Oriented to person place and time. No obvious depression or anxiety.  MUSCULOSKELETAL: No obvious misalignment or effusion of the joints. No clubbing, cyanosis of the digits.  RIGHT AND LEFT LOWER EXTREMITIES: No edema, no inflammation, no tenderness.  SKIN:  normal skin color, texture, turgor and no redness, warmth, or swelling. No palpable nodules    DATA:                        Assessment:     Mild asthma          Plan:     D/w pt  Cpm  No refill needed  Rtc 6 months

## 2023-07-14 ENCOUNTER — Encounter: Payer: MEDICARE | Attending: Internal Medicine | Primary: Family

## 2023-07-22 NOTE — Progress Notes (Unsigned)
 MRN: [id number]  Name: Dalton Warner  DOB: Apr 21, 1930    Insurance: Payor: MEDICARE /  /  /      Phone #: [phone]  Provider: Lorean Rodes, MD     Date of Visit: 08/02/2023    Reason for visit: 6 MO Recent Hospitalization Date:    Reason for Hospitalization:    Last EKG:10/24  Type of Device:ST JUDE 4/25       Vitals BP HR O2% WT HT ORTHO BP LYING ORTHO BP SITTING ORTHO BP SITTING   Today's Findings           Patients work up- Check List     Testing Last Date Completed Date Expected  (Circle One) Additional Notes    MA to document For provider to complete Either MA or Provider    Carotid Duplex  STAT 1 WK 6 MTH       THIS WK 2 WK 1 YEAR     Cardiac CTA  STAT 1 WK 6 MTH       THIS WK 2 WK 1 YEAR     Cardiac CT Calcium  scoring  STAT 1 WK 6 MTH       THIS WK 2 WK 1 YEAR     CTA Chest, Abdomen & Pelvis  STAT 1 WK 6 MTH       THIS WK 2 WK 1 YEAR     CT Chest IV w/ Contrast  STAT 1 WK 6 MTH       THIS WK 2 WK 1 YEAR     CT Chest w/o Contrast  STAT 1 WK 6 MTH       THIS WK 2 WK 1 YEAR     CXR  STAT 1 WK 6 MTH       THIS WK 2 WK 1 YEAR     ECHO  Stress Complete Limited     MRI- Cardiac  STAT 1 WK 6 MTH       THIS WK 2 WK 1 YEAR     MUGA Scan  STAT 1 WK 6 MTH       THIS WK 2 WK 1 YEAR     Nuclear Stress  Lexiscan Cardiolite     PFT 2022 STAT 1 WK 6 MTH       THIS WK 2 WK 1 YEAR     Treadmill Stress Test  STAT 1 WK 6 MTH       THIS WK 2 WK 1 YEAR     Vascular Duplex  Lower: Right Left Bilat       Upper: Right Left Bilat     Other Test Not Listed:    Monitors Last Date Completed Day's Request/Ordered     Holter  Short term 24 hours 48 hours      Long term 3 days 7 days 14 days   Event   (1-30 days)      Procedures Last Date Performed Procedure Details Date Expected   Additional Notes    ASD Closure        Carotid Angio        Cardioversion        Heart Cath  R L R&L      Peripheral Angio  R L      PFO Closure        PTCA/PCI        TEE        TEE/Cardioversion  Venogram        Tilt Table        Other Type of  Procedure:   Cardiac Clearance  (Circle One)   Needs Further Workup Not Cleared Cleared (Circle risk below)     Low Risk     Moderate Risk     High Risk   Additional Notes:   Consult/ Referral   CT Surgery Electrophysiology TAVR Coordination Johnanna Mylar [facility]ab[facility]) [facility] Venous Ablation Consult Watchman Consult  (Circle Physician Below)        Alycia Jude   Additional Notes:   Labs Date Last Completed Date Expected Retrieve from outside source?    CMP 3/25      BNP       CBC 3/25      INR       Lipid       TSH       A1C       Other:      Additional Notes:   Medication/Samples Type of Medication Additional Notes    Samples of      Paper script      Research Study     Other:   Next Office Visit  (Circle One)   1 month 3 months 6 months 9 months 1 year PRN 1 week 2 weeks   Additional Notes:

## 2023-08-02 ENCOUNTER — Ambulatory Visit: Admit: 2023-08-02 | Discharge: 2023-08-02 | Payer: MEDICARE | Attending: Internal Medicine | Primary: Family

## 2023-08-02 VITALS — BP 128/82 | HR 60 | Ht 67.0 in | Wt 170.0 lb

## 2023-08-02 DIAGNOSIS — I4821 Permanent atrial fibrillation: Secondary | ICD-10-CM

## 2023-08-02 NOTE — Progress Notes (Signed)
 CLINICAL STAFF DOCUMENTATION    Dr. Clarine Mao     Dalton Warner  06/01/1930  [id number]    Have you had any Chest Pain recently? - No      Have you had any Shortness of Breath - Always which is ongoing.     Have you had any dizziness - No    Have you had any palpitations recently? - No      Do you have any edema - swelling in No        Is the patient on any of the following medications -     When did you have your last labs drawn 04/2023  What doctor ordered CHALLA   Do we have the labs in their chart Yes      Do you need any prescriptions refilled? - No    Do you have a surgery or procedure scheduled in the near future - No      Do use tobacco products? - No  Do you drink alcohol? - 1 glass of wine daily   Do you use any illicit drugs? - No  Caffeine? - 1 cup of coffee daily.       Check medication list thoroughly!!! AND RECONCILE OUTSIDE MEDICATIONS  If dose has changed change the entire order not just the MG  BE SURE TO ASK PATIENT IF THEY NEED MEDICATION REFILLS  Verify Pharmacy and update if incorrect    Add to every patient's wrap up the following dot phrase AFTERVISITCARDIOHEARTHOUSE and ensure we explain this to our patients

## 2023-08-02 NOTE — Progress Notes (Signed)
 Dalton Wallie Mao, MD                                  CARDIOLOGY  NOTE         Chief Complaint:    Chief Complaint   Patient presents with    6 Month Follow-Up      Repeat falls, easy bruising  Atrial fibrillation    Echo: 09/19/2019    Left ventricular function is normal, EF is estimated at 55-60%.   Mild left ventricular hypertrophy.   Diastolic dysfunction could not be evaluated due to arrhythmia.   Abnormal (paradoxical) motion consistent with RV pacemaker.   No regional wall motion abnormalities were detected.   Bi atrial enlargement noted.   Sclerotic, but non-stenotic aortic valve.   Mitral annular calcification is present.   Moderate mitral, aortic, tricuspid and mild pulmonic regurgitation is   present.   Mild pulmonary hypertension with an RVSP of .   Dilation of the aortic root(4.0) and the ascending aorta(4.2).   No evidence of pericardial effusion.      Prior HPI:     Dalton Warner is a 89 y.o. year old male who was seen in the hospital for chronic atrial fibrillation.  Patient has history of chronic thrombocytopenia.  He has recently moved into town to take care of his wife.  Patient also has a history of LV thrombus in the past.    Chronic atrial fibrillation  CHA2DS2-VASc score is 6     EKG: Atrial fibrillation at 66 bpm      Current Outpatient Medications   Medication Sig Dispense Refill    fluorouracil (EFUDEX) 5 % cream Apply TOPICALLY TO skin cancer on left arm TWICE DAILY FOR 6 weeks. Wash hands after application.      apixaban  (ELIQUIS ) 2.5 MG TABS tablet Take 1 tablet by mouth 2 times daily 180 tablet 3    atorvastatin  (LIPITOR ) 40 MG tablet Take 1 tablet by mouth daily 30 tablet 5    metoprolol  succinate (TOPROL  XL) 50 MG extended release tablet Take 1 tablet by mouth nightly 90 tablet 3    SYSTANE 0.4-0.3 % ophthalmic solution       ketoconazole (NIZORAL) 2 % shampoo       ketoconazole (NIZORAL) 2 % cream       ammonium lactate (AMLACTIN) 12 % cream       cetirizine  (ZYRTEC ) 10 MG tablet  Take 1 tablet by mouth daily      pantoprazole  (PROTONIX ) 40 MG tablet Take 1 tablet by mouth 2 times daily (before meals) 60 tablet 2    guaiFENesin  (MUCINEX ) 600 MG extended release tablet Take 2 tablets by mouth 2 times daily      alfuzosin (UROXATRAL) 10 MG extended release tablet Take 1 tablet by mouth daily      albuterol sulfate (PROAIR RESPICLICK) 108 (90 Base) MCG/ACT aerosol powder inhalation 2 puffs every 6 hours as needed      montelukast  (SINGULAIR ) 10 MG tablet 1 tablet nightly      vitamin B-12 (CYANOCOBALAMIN) 1000 MCG tablet Take 1 tablet by mouth daily (Patient not taking: Reported on 08/02/2023)       No current facility-administered medications for this visit.       Allergies:     Cat dander, Dust mite extract, Other, and Fish allergy    Patient History:    Past Medical History:   Diagnosis Date  Arthritis     Atrial fibrillation (HCC)     Bleeding ulcer 08/2018    Cancer [facility])     Prostate cancer dx March 2010- tx with radiation     Colon cancer [facility]) 2000    per old chart dx with colon ca - tx with surgery only    Diabetes mellitus (HCC)     was borderline- at one time was on Metformin for several yrs-  when I went to [facility] they had me stop this in 2017    Fracture     had broken neck in Nov 2016- wore a collar for 6 weeks only    HOH (hard of hearing)     bil hearing aide    Hx of blood clots     had blood clot - it was a  blood clot in  my esophagus     Hx of Doppler ultrasound 09/19/2019    EF IS 55-60%  Bi atrial enlargement noted.  Moderate mitral, aortic, tricuspid and mild pulmonic regurgitation is present    Hx of fall     last fall 2016- use cane walker and have electric chair    Hyperlipidemia     Hypertension     follow with Dr Orvel at [facility]    Limited range of motion (ROM) of shoulder     right shoulder haved 10 % usage and left shoulder 60 % of usage    Neuropathy     both feet    Sleep apnea     had sleep study- tried to give me cpap but could not  tolerate it     TIA (transient ischemic attack)     'in Jan 2009- trouble with speech, numbness of lip, weakness right arm - all only lasted for 20 seconds    Unsteady gait     hx of back problem- was on steroids for 4 yrs- since then stability issues with my back -     Wears glasses     to read      Past Surgical History:   Procedure Laterality Date    ABDOMEN SURGERY  2000    per old chart had right hemicolectomy removed 2 and a half feet of colon and my appendix    COLONOSCOPY  2019    CT BIOPSY BONE MARROW  11/02/2016    CT BONE MARROW BIOPSY 11/02/2016    ENDOSCOPY, COLON, DIAGNOSTIC      per old chart had EGD done 08/2018 and 09/2018    HERNIA REPAIR      per old chart open left ing hernia 1980 and then recurrent left ing hernia repair 08/2018    JOINT REPLACEMENT      per old chart total left knee 2018    PACEMAKER PLACEMENT      per old chart hx of st jude pacer insertion 2008- new pacer inserted 2019    SHOULDER SURGERY Left 2015    rota. cuff    SHOULDER SURGERY Right 02/27/2019    RIGHT REVERSE SHOULDER TOTAL ARTHROPLASTY REVERSE performed by Chauncey Gladis Hummer, MD at [facility] OR     Family History   Problem Relation Age of Onset    Cancer Mother         lung cancer    Diabetes Mother     Diabetes Sister     Cancer Brother     High Blood Pressure Brother      Social History  Tobacco Use    Smoking status: Never    Smokeless tobacco: Never   Substance Use Topics    Alcohol use: Yes     Comment: maybe 6 times per year when had prostate cancer did drink one glass red wine daily in the past  1cup coffee              Objective:      Physical Exam:    BP 128/82 (BP Site: Left Upper Arm, Patient Position: Sitting, BP Cuff Size: Medium Adult)   Pulse 60   Ht 1.702 m (5' 7)   Wt 77.1 kg (170 lb)   BMI 26.63 kg/m   Wt Readings from Last 3 Encounters:   08/02/23 77.1 kg (170 lb)   06/13/23 75.8 kg (167 lb)   01/13/23 76.2 kg (168 lb)     Body mass index is 26.63 kg/m.  Vitals:    08/02/23 1452   BP: 128/82    Pulse: 60        General Appearance and Constitutional: Conversant, Well developed, Well nourished, No acute distress, Non-toxic appearance.   HEENT:  Normocephalic, Atraumatic, Bilateral external ears normal, Oropharynx moist, No oral exudates,   Nose normal.   Neck- Normal range of motion, No tenderness, Supple  Eyes:  EOMI, Conjunctiva normal, No discharge.   Respiratory:  Normal breath sounds, No respiratory distress, No wheezing, No Rales, No Ronchi.  No chest tenderness.   Cardiovascular: S1-S2 IRIR, no added heart sounds, No Mumurs appreciated. No gallops, rubs. No Pedal Edema   GI:  Bowel sounds normal, Soft, No tenderness,  GU:  No costovertebral angle tenderness   Musculoskeletal:  No gross deformities. Back- No tenderness  Integument:  Well hydrated, no rash   Lymphatic:  No lymphadenopathy noted   Neurologic:  Alert & oriented x 3, Normal motor function, normal sensory function, no focal deficits noted   Psychiatric:  Speech and behavior appropriate       Medical decision making and Data review:    DATA:    Lab Results   Component Value Date    TROPONINT <0.010 03/26/2021     BNP:    Lab Results   Component Value Date    PROBNP 2,032 (H) 01/13/2020     PT/INR:  No results found for: PTINR  Lab Results   Component Value Date    LABA1C 5.8 02/26/2021    LABA1C 6.1 09/29/2020     Lab Results   Component Value Date    CHOL 80 02/26/2021    TRIG 125 02/26/2021    HDL 36 (L) 02/26/2021    LDLDIRECT 37 02/26/2021     Lab Results   Component Value Date    WBC 6.4 05/06/2023    HGB 11.0 (L) 05/06/2023    HCT 33.9 (L) 05/06/2023    MCV 84.8 05/06/2023    PLT 86 (L) 07/29/2022     TSH: No results found for: TSH  Lab Results   Component Value Date    AST 20 05/06/2023    ALT 15 05/06/2023    BILITOT 0.7 05/06/2023    ALKPHOS 71 05/06/2023         All labs, medications and tests reviewed by myself including data and history from outside source , patient and available family .      1. Permanent atrial  fibrillation (HCC)    2. Essential hypertension    3. Dyslipidemia    4. Pacemaker    5.  Sick sinus syndrome (HCC)                 Impression and Plan:    Chronic atrial fibrillation, with history of TIA as well as hx of LV thrombus.  CHA2DS2-VASc score is > 6. on low-dose Eliquis  understanding high risk of ischemic stroke versus bleeding risk. Cont BB  - No LV thrombus noted on last Echo     Anemia as well as thrombocytopenia ( Follows with Dr. Bethany) last hemoglobin 11.1.  Stable.  On Iron supplements.  Can be observed on low-dose OAC  Fall in October 2024    We discussed the option of Watchman procedure again today.  Patient has been fairly unsteady on his feet with and with history of thrombocytopenia and falls would like to come off of anticoagulation.  He has been using a walker    Will schedule patient for transesophageal echocardiogram followed by Watchman procedure.  Case was discussed with Dr. Selestine   Continue with low-dose Eliquis  2.5 mg p.o. twice daily    History of GI bleeding: Follows up with Dr. Denis -no acute bleeding lately.    Hyperlipidemia: Cont lipitor  40 mg daily      HTN: Cont Metoprolol  50 mg po daily, BP well controlled     Hyperlipidemia: Continue with Lipitor  40 mg daily    SSS s.p PPM . VVIR - Follow up with pacer clinic as outpt       Return in about 1 month (around 09/01/2023).          Counseled extensively and medication compliance urged.  We discussed that for the  prevention of ASCVD our  goal is aggressive risk modification.Patient is encouraged to exercise even a brisk walk for 30 minutes  at least 3 to 4 times a week   Various goals were discussed and questions answered. Continue current medications. Appropriate prescriptions are addressed and refills ordered.  Questions answered and patient verbalizes understanding.  Call for any problems, questions, or concerns.

## 2023-08-02 NOTE — Progress Notes (Signed)
 Patient known to Dr. Bertell he has been having recurrent falls and is at risk of bleeding decided to pursue Watchman procedure he will get TEE with Dr. Bertell  CHA2DS2-VASc Score for Atrial Fibrillation Stroke Risk   Risk   Factors  Component Value   C CHF No 0   H HTN Yes 1   A2 Age >= 75 Yes,  (89 y.o.) 2   D DM No 0   S2 Prior Stroke/TIA No 0   V Vascular Disease No 0   A Age 45-74 No,  (89 y.o.) 0   Sc Sex male 0    CHA2DS2-VASc  Score  3   Score last updated 08/02/23 3:12 PM EDT    Click here for a link to the UpToDate guideline Atrial Fibrillation: Anticoagulation therapy to prevent embolization    Disclaimer: Risk Score calculation is dependent on accuracy of patient problem list and past encounter diagnosis.     HAS-BLED Score                          Risk Factors      Points   Hypertension  Uncontrolled, >160 mmHg systolic   Yes=1   Renal disease  Dialysis, transplant, Cr>2.26 mg/dL or >799 mol/L   No=0   Liver disease   Cirrhosis or bilirubin >2x normal with AST/ALT/AP >3x normal   No=0   Stroke history   No=0   Prior major bleeding or predisposition to bleeding     1   Labile  INR  Unstable/high INRs, time in therapeutic range <60%   No=0   Age >110   Yes=1   Medication usage predisposing to bleeding   Aspirin, clopidogrel, NSAIDs   Yes=1   Alcohol use   >7 drinks/week   No=0     HAS-BLED Score:    3:  Risk was 5.8% in one validation study and 3.72 bleeds per 100 patient-years in another validation study.       Atrial fibrillation:   paroxysmal  Is atrial fibrillation Valvular No    Atrial flutter:    no    LAA Occlusion Indication (select all that apply)   High fall risk    NYHA class  III    Will plan watchman after TEE

## 2023-08-03 ENCOUNTER — Encounter

## 2023-08-04 NOTE — Telephone Encounter (Signed)
 Pt notified of procedure date and time. Procedure scheduled on 08/08/23 @ 10:30 Am, arrival @ 9:30 Am. Procedure instructions gone over with pt. Pt voiced understanding.

## 2023-08-04 NOTE — Telephone Encounter (Signed)
 Patient given instructions over telephone on 08/04/23.  Procedure is scheduled for 08/08/23 @ 10:30 Am, w/arrival @ 9:30 Am, @ [facility]. Medication/Education Letter gone over with patient. Procedure and risks were explained to patient. Questions answered, Patient voiced understanding.        Patient was notified that procedure date or time could be changed due to an emergency. Patient voiced understanding.

## 2023-08-04 NOTE — Telephone Encounter (Signed)
 Patient returned a call to [place] call transfered

## 2023-08-04 NOTE — Telephone Encounter (Signed)
 Attempted to notify pt of procedure date, time and instructions. No answer. Vm left.

## 2023-08-04 NOTE — Telephone Encounter (Signed)
 [facility]     Dr. EMERSON Wallie Mao     Transesophageal Echocardiogram    Patient Name: BALEY LORIMER   DOB: 1930-08-28   MRN# [id number]     Date of Procedure: 08/08/23 Time: 10:30 Am Arrival Time: 9:30 Am   (Arrival time is scheduled for one (1) hour before procedure is scheduled.)     Hospital: [facility])     X   If you have received orders for blood work and or a chest x-ray, please have         them done on assigned date at [facility],         [facility], or [facility] [place].    X   Please do not have anything by mouth after midnight prior to or 8 hours before         the procedure.    X   You may take your medication with a sip of water unless advised otherwise below.     X Please continue to take Eliquis  (apixaban ) as directed.

## 2023-08-05 ENCOUNTER — Inpatient Hospital Stay: Admit: 2023-08-05 | Payer: MEDICARE | Primary: Family

## 2023-08-05 ENCOUNTER — Ambulatory Visit: Admit: 2023-08-05 | Discharge: 2023-08-08 | Payer: MEDICARE | Attending: Internal Medicine | Primary: Family

## 2023-08-05 VITALS — BP 136/74 | HR 82 | Temp 98.20000°F | Ht 67.0 in | Wt 170.0 lb

## 2023-08-05 DIAGNOSIS — D472 Monoclonal gammopathy: Principal | ICD-10-CM

## 2023-08-05 NOTE — Telephone Encounter (Signed)
 Pt is returning call to [facility]

## 2023-08-05 NOTE — Telephone Encounter (Signed)
 Spoke with pt. Pt educated that Monday's procedure is not watchman procedure but TEE as a step towards the watchman procedure. Pt notified he would have appointment with Dr.Gujja's team prior to [facility] procedure. Pt educated on TEE procedure. Pt voiced understanding and wishes to continue with TEE scheduled on 08/08/23. Procedure instructions gone over with pt again. Pt voiced understanding.

## 2023-08-05 NOTE — Telephone Encounter (Addendum)
 Spoke with patient on phone. States he is extremely H.O.H. and was concerned he did not have the correct information. Reviewed instructions for the TEE. Informed Watchman will most likely be scheduled in August.  He lives at [facility] and has a neighbor who will bring him to the hospital and take him home.      Placed call to patient. Left VM explaining TEE is a pre-Watchman test. And further education by [facility] coordinator and myself will be forthcoming.

## 2023-08-05 NOTE — Progress Notes (Signed)
 Patient Name:  Dalton Warner  Patient DOB:  September 27, 1930  Patient MRN:  [id number]     Primary Oncologist: Renaye Him, MD  Referring Provider: Verdie Delon [name], APRN - CNP     Date of Service: 08/05/2023      Reason for Consult:  Thrombocytopenia     Chief Complaint:    Chief Complaint   Patient presents with    Follow-up         Encounter Diagnoses   Name Primary?    Monoclonal gammopathy Yes    Thrombocytopenia     Anemia, unspecified type           HPI:   11/22/18: Arrived alone to the clinic today. Loves to talk.  According to Dr. Shelton his oncologist at [facility] he was diagnosed with prostate cancer in 2010.  Received external beam radiation therapy.  He has been on Xarelto  since 2017 for A. fib.  Prior to which he was on Coumadin since 2008.  Further work-up was negative for hepatitis B, hepatitis C and HIV.  No evidence of hemolysis.  B12 was on lower side.  Folate was normal.  Iron studies were normal.  Serum protein electrophoresis was normal.  But imaging of fixation revealed a small IgG lambda monoclonal band.  Was advised to be on B12 supplements.  Today at the office he reported that he had hernia surgery on July 24 when he received platelet transfusion.  Reported that his been caregiver to his wife for 20 years and is currently at [facility] home at the other mass unit.  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  Married for 67 years.  Lost a lot of weight in the process of taking care of his wife.  Reported tiredness.  No bleeding or any rash.  No chest pain.  Reported arthritis pain all the time.  Reported that he is taking oral B12 supplements thousand micrograms daily.    11/02/2016: AFA:Fnmeynonhpr review demonstrates a bone marrow that is approximately 30% cellular with trilineage hematopoiesis and 1-2% blasts based on the manual differential count, as confirmed by immunophenotypic studies. There are mild myeloid dyspoietic changes as well as  minimal erythroid atypia. Although reactive etiologies including medications, toxins and nutritional deficiencies must be excluded, the patient's cytopenias and bone marrow morphologic findings raise the possibility of an underlying myelodysplastic syndrome.  FISH/Cytogenetics: Normal    07/20/2018 CBC with WBC of 6.52 hemoglobin of 11.0 hematocrit 34.7 MCV of 93.3 platelets of 78 diff within normal limits    March 2020  platelet count was 81.    Jan 2020  it was 85K    09/2018: CT chest:1. Ectasia of the ascending thoracic aorta measuring 4.2 cm in short-axis diameter.  This may be due to aortic stenosis considering the aortic valve calcifications present.  2. No evidence of aortic dissection.  3. Atherosclerotic calcification of the 3 major native coronary arteries.  4. Moderate dilation of cardiac chambers.  5. Evidence of prior granulomatous disease with no definite pneumonia or suspicious-appearing pulmonary nodules.  6. Trace right pleural effusion.  7. Mild fatty infiltration of the liver.  8. Small sliding-type hiatal hernia.    09/08/18: Ultrasound:1. Mild ascites evident in the left upper quadrant.  2. Nonvisualized pancreas and majority of the abdominal aorta due to overlying bowel gas.  3. Otherwise unremarkable study including no evidence of cirrhosis and normal splenic size measuring 9.6 cm.  12/29/18: Ct shoulder:  1. Interstitial tearing of the subscapularis, supraspinatus, and   infraspinatus tendons with full-thickness tearing predominantly along the   conjoined fibers of the supraspinatus and infraspinatus tendons which   measures approximately 1.0 x 1.4 cm.  There is also full-thickness   perforation of the anterior fibers of the supraspinatus tendon.  Underlying   diffuse mild atrophy of the rotator cuff musculature.   2. Severe advanced osteoarthritis of the glenohumeral joint with underlying   diffuse complex labral tearing.   3. Severe osteoarthritis of the acromioclavicular joint.   4.  Synovitis.     Feb 27 2019:Right rotator cuff surgery. Lost lot of blood and received 3 units of PRBCs.     Feb 27 2019:  Unremarkable appearance of reverse glenohumeral arthroplasty.       AC joint osteoarthritis.         01/13/20:Presented to [facility] with sx of fatigue, Acute bronchitis, reflux sx, hans and fett feeling cold. generalized arthritis pains. No overt bleeding. No bruising. Palpitations. No pain, increased sob. No dizziness.      Feb 2022 CXR normal     03/10/2021 vitamin B12 841.1  CBC WBC 6.7 hemoglobin 11.3 hematocrit 35.3 MCV 88.5 platelets 85  CMP basically WNL  Ferritin 229  Folate 10.9  IgA 118  IgG 982  IgM 66    Electrophoresis protein: restricted band of protein migration in the gamma region which is too small to quantify. IFE gel shows a faint band in lambda which may be indicative of a specific immune response or an early monoclonal protein.     03/26/2021 CT abdomen pelvis  Impression   1. No acute intraabdominal process identified.   2. Colonic diverticulosis without CT evidence of diverticulitis.   3. Severe atherosclerotic disease.   CBC WBC 8.3 hemoglobin 11.4 hematocrit 35.0 MCV 87.3 platelets 84  CMP BUN 14 creatinine 0.8  Lipase 58  Magnesium  2.0    June 15, 2023B12 906 CBC with WBC of 8.6 hemoglobin of 11.1 hematocrit 35.5 MCV of 87.7 and platelets of 93.  CMP basically within normal limits ferritin 229 serum protein electrophoresis with a restricted band of protein migration in the gamma region which is too small to quantify.  Iron saturations of 34% folic acid 8.9    04/19/2022, B12 736 CBC with WBC of 7.5 hemoglobin of 10.7 hematocrit 33.2 and platelets of 65 K CMP with creatinine 1.2 total bilirubin of 1.2 ferritin of 322 folic acid 6.7    07/29/2022 B12 more than 2000, CBC WBC 7.1 hemoglobin of 11.1 hematocrit 35.3 MCV of 87.2 and platelets of 86 KCMP within normal limits ferritin 192 folic acid 8.6 reticulocyte count of 1.8 iron saturations of 34% and sed rate less than  1    05/06/2023 B12 426CBC with WBC of 6.4 hemoglobin of 11 hematocrit 33.9 MCV of 84.8 and platelets of 82 CMP with sodium of 135 potassium of 4.7 BUN of 17 creatinine 1 LFTs within normal limits iron saturations of 30% ferritin of 109 folate of 7.9 serum protein electrophoresis with faint IgG kappa     Past Medical History:     A. fib, osteoarthritis, borderline diabetes.  Seasonal allergies, prostate cancer hypertension.  Past Surgery History:      Basal cell skin cancer removal, cardiac pacemaker, colectomy for diverticular disease, hernia repair, left knee replacement, appendectomy                                                              Social History:   Lives alone.  Has 3 children, they live in [place]  in [place] .  Retired as Haematologist, [employer] position.  Denies any smoking history or any other illicit drug abuse.                                                                                                    Family History:    Mother was a non-smoker was diagnosed with lung cancer.  Younger brother with colon cancer.  His sister was diagnosed with uterine, ovarian cancer at the age of 13.                                                                                           Allergies   Allergen Reactions    Cat Dander Other (See Comments)     Runny nose    Dust Mite Extract Other (See Comments)     Runny nose    Other      Other reaction(s): Other (See Comments)  Dust/cats - sneezing    Fish Allergy Nausea And Vomiting     Salmon only  Salmon only       Current Outpatient Medications on File Prior to Visit   Medication Sig Dispense Refill    fluorouracil (EFUDEX) 5 % cream Apply TOPICALLY TO skin cancer on left arm TWICE DAILY FOR 6 weeks. Wash hands after application.      apixaban  (ELIQUIS ) 2.5 MG TABS tablet Take 1 tablet by mouth 2 times daily 180 tablet 3    atorvastatin  (LIPITOR ) 40 MG tablet Take 1 tablet by mouth daily 30  tablet 5    metoprolol  succinate (TOPROL  XL) 50 MG extended release tablet Take 1 tablet by mouth nightly 90 tablet 3    SYSTANE 0.4-0.3 % ophthalmic solution       vitamin B-12 (CYANOCOBALAMIN) 1000 MCG tablet Take 1 tablet by mouth daily      ketoconazole (NIZORAL) 2 % shampoo       ketoconazole (NIZORAL) 2 % cream       ammonium lactate (AMLACTIN) 12 % cream       cetirizine  (ZYRTEC ) 10 MG tablet Take 1 tablet by mouth daily  pantoprazole  (PROTONIX ) 40 MG tablet Take 1 tablet by mouth 2 times daily (before meals) 60 tablet 2    guaiFENesin  (MUCINEX ) 600 MG extended release tablet Take 2 tablets by mouth 2 times daily      alfuzosin (UROXATRAL) 10 MG extended release tablet Take 1 tablet by mouth daily      albuterol sulfate (PROAIR RESPICLICK) 108 (90 Base) MCG/ACT aerosol powder inhalation 2 puffs every 6 hours as needed      montelukast  (SINGULAIR ) 10 MG tablet 1 tablet nightly       No current facility-administered medications on file prior to visit.     Interval history:08/05/23: Alone here here to clinic alone using walker.  Reported that he has been tired but overall feels fine. Happy all the time.   Denies any bleeding.   No pain.  No weight loss.  Appetite is okay.  Denying chest pain, abdominal pain, lower extremity edema.  He is being evaluated for possible Watchman procedure.    Review of Systems:    As per the interval history, otherwise rest of the ros negative     Vital Signs: BP 136/74 (BP Site: Right Upper Arm, Patient Position: Sitting, BP Cuff Size: Medium Adult)   Pulse 82   Temp 98.2 F (36.8 C) (Infrared)   Ht 1.702 m (5' 7)   Wt 77.1 kg (170 lb)   SpO2 96%   BMI 26.63 kg/m      Physical Exam:  CONSTITUTIONAL: awake, alert ,Uses walker for ambulation.   EYES:PEAR, no palor or any icetrus  ZWU:JUWR  NECK: No JVD  HEMATOLOGIC/LYMPHATIC: no cervical, supraclavicular or axillary lymphadenopathy   LUNGS: coarse bs left side   CARDIOVASCULAR: s1s2 irr irr ESM  ABDOMEN: soft ntnd bs  pos  NEUROLOGIC: GI  SKIN: No rash  EXTREMITIES: no LE edema bilaterally.      Labs:  Hematology:  Lab Results   Component Value Date    WBC 6.4 05/06/2023    RBC 4.00 (L) 05/06/2023    HGB 11.0 (L) 05/06/2023    HCT 33.9 (L) 05/06/2023    MCV 84.8 05/06/2023    MCH 27.5 05/06/2023    MCHC 32.4 05/06/2023    RDW 20.6 (H) 05/06/2023    PLT 86 (L) 07/29/2022    MPV 10.9 07/23/2021    BANDSPCT 3 (L) 04/19/2022    BASOPCT 1 05/06/2023    LYMPHOPCT 30 05/06/2023    MONOPCT 9 (H) 05/06/2023    BANDABS 0.23 04/19/2022    EOSABS 0.09 05/06/2023    BASOSABS 0.06 05/06/2023    LYMPHSABS 1.89 05/06/2023    MONOSABS 0.55 05/06/2023    DIFFTYPE  07/29/2022     AUTOMATED DIFF RESULTS CONFIRMED BY SMEAR REVIEW  AUTOMATED DIFFERENTIAL      ANISOCYTOSIS 1+ 07/29/2022    WBCMORP OCCASIONAL 02/28/2019     No results found for: ESR    Chemistry:  Lab Results   Component Value Date    NA 135 (L) 05/06/2023    K 4.7 05/06/2023    CL 99 05/06/2023    CO2 23 05/06/2023    BUN 17 05/06/2023    CREATININE 1.0 05/06/2023    GLUCOSE 127 (H) 05/06/2023    CALCIUM  8.9 05/06/2023    BILITOT 0.7 05/06/2023    ALKPHOS 71 05/06/2023    AST 20 05/06/2023    ALT 15 05/06/2023    LABGLOM 69 05/06/2023    GFRAA >60 09/29/2020    GFRAA >60 09/29/2020  MG 2.0 03/26/2021    POCGLU 99 02/27/2019     Lab Results   Component Value Date    LDH 170 05/24/2019     No results found for: LD  Lab Results   Component Value Date    [facility] 2.770 02/26/2021     Immunology:  Lab Results   Component Value Date    SPEP  04/01/2020     INTERPRETATION - A faint free lambda light chains monoclonal band is detected. SAF    SPEP  04/01/2020     INTERPRETATION - Concurrent immunofixation detected a faint free lambda light chains monoclonal band, which is too small to measure. SAF    ALBUMINELP 3.6 04/01/2020    GAMGLOB 0.9 04/01/2020     No results found for: KAPPAUVOL, LAMBDAUVOL, KLFLCR  No results found for: B2M  Coagulation Panel:  Lab Results   Component Value  Date    PROTIME 15.6 (H) 03/26/2021    INR 1.21 03/26/2021    APTT 46.4 (H) 03/03/2019     Anemia Panel:  Lab Results   Component Value Date    VITAMINB12 426 05/06/2023    FOLATE 7.9 05/06/2023     Tumor Markers:  Lab Results   Component Value Date    PSA 2.1 08/20/2022        Observations:  ECOG:  No data recorded         Assessment & Plan:                                                          Chronic thrombocytopenia: Etiology could be secondary to underlying MDS vs ITP.  Cytogenetics in the past were normal, blast being 2%, his IPSS score was about 1.5 which is a low score. No evidence of hemolysis, hepatitis, HIV in the past.  June 2025 platelet count remains relatively stable at 82K  Continue to monitor for now and discussed further evaluation including bone marrow biopsy but he defers at this point and I also dont think there is a compelling need.   Discussed the role of TPO agent in elective surgery.    Normocytic anemia: Could be sec to early MDS? Hb stable around 11 and ferritin  and b12 not low. Continue to follow for now.   Defers further evaluation with bone marrow biopsy, aspiration which I am okay with.    Monoclonal gammopathy noted to have small IgG lambda band on the immunofixation but normal serum protein electrophoresis.  Normal renal function normal calcium  levels.  Serum immunoglobulins and serum free light chains were normal.  No evidence of any myeloma defining disease other than anemia which is probably not sec to that.  Repeat panel in June 2025with very faint M protein too small to measure.  Continue to monitor for now.    Paroxysmal atrial fibrillation.  Has history of TIA.  Has pacemaker.  Is on Eliquis  2.5 mg po bid.   Continue as long as a platelet count of more than 60 K.  Avoid falls, deep cuts and monitor closely for any bleeding.  He is being evaluated for possible Watchman procedure.    H/o Colon cancer: s/p resection in 2000, did not receive any adjuvant treatment. Also Family  history of colon cancer.  Reported that he is up-to-date with the  colonoscopy.    H/O Prostate cancer: Diagnosed in 2010. S/P XRT. Is being followed by Urology.     GERD: Pharmacological intervention not really helping but restricting diet has been helping. Has been tried on dexlansoprazole as well. Has been evaluated by GI    Family history ovarian cancer.  No genetic testing has been done and we discussed about that. He defered genetic testing at this point    Continue other medical care.    Discussed above findings and plan with him and he voiced understanding.  Answered all his questions.    Discussed healthy lifestyle including healthy diet, regular exercise as tolerated.  Also discussed importance of being up-to-date with age-appropriate screening tools. Colonoscopy looks like was done in 2019, but he said he would follow    Recommend follow-up with primary care physician and other specialists.    Please do not hesitate to contact us  if you need further information.    Return to clinic November 2025 or earlier if new symptoms.      This note is created with the assistance of a speech-recognition program. While intending to generate a document that accurately reflects the content of the visit, no guarantee can be provided that every mistake has been identified and corrected by editing.    JC

## 2023-08-05 NOTE — Progress Notes (Unsigned)
 MA/LPN Rooming Questions  Patient: Dalton Warner  MRN: [id number]    Date: 08/05/2023        1. Do you have any new issues?   yes -          2. Do you need any refills on medications?    no    3. Have you had any imaging done since your last visit?   no    4. Have you been hospitalized or seen in the emergency room since your last visit here?   no    5. Did the patient have a depression screening completed today? No    No data recorded     PHQ-9 Given to (if applicable):               PHQ-9 Score (if applicable):                     []  Positive     []   Negative              Does question #9 need addressed (if applicable)                     []  Yes    []   No               Josette Ada, MA

## 2023-08-05 NOTE — Telephone Encounter (Signed)
 Pt feels dr bertell didn't go through his history with his heart. He is saying he got a brochure on having the watchman and just doesn't feel he got enough information or talked about this enough. So he doesn't want this done until someone calls. He cancelled his procedure for Monday 6/30

## 2023-08-08 ENCOUNTER — Encounter

## 2023-08-08 ENCOUNTER — Inpatient Hospital Stay: Admit: 2023-08-08 | Payer: MEDICARE | Attending: Internal Medicine | Primary: Family

## 2023-08-08 ENCOUNTER — Encounter: Payer: MEDICARE | Primary: Family

## 2023-08-08 VITALS — BP 121/74 | HR 65 | Resp 27

## 2023-08-08 DIAGNOSIS — I4821 Permanent atrial fibrillation: Principal | ICD-10-CM

## 2023-08-10 LAB — COMPREHENSIVE METABOLIC PANEL

## 2023-08-10 LAB — TSH

## 2023-08-10 LAB — LIPID PANEL

## 2023-08-15 ENCOUNTER — Inpatient Hospital Stay: Admit: 2023-08-15 | Discharge: 2023-08-22 | Payer: MEDICARE | Primary: Family

## 2023-08-15 VITALS — BP 138/71 | HR 65 | Resp 20 | Ht 67.0 in | Wt 170.0 lb

## 2023-08-15 DIAGNOSIS — I4819 Other persistent atrial fibrillation: Principal | ICD-10-CM

## 2023-08-15 MED ORDER — FLUMAZENIL 0.5 MG/5ML IV SOLN
0.5 | INTRAVENOUS | Status: AC
Start: 2023-08-15 — End: ?

## 2023-08-15 MED ORDER — MIDAZOLAM HCL 5 MG/5ML IJ SOLN
5 | INTRAMUSCULAR | Status: AC
Start: 2023-08-15 — End: ?

## 2023-08-15 MED ORDER — LIDOCAINE VISCOUS HCL 2 % MT SOLN
2 | OROMUCOSAL | Status: AC
Start: 2023-08-15 — End: ?

## 2023-08-15 MED ORDER — FENTANYL CITRATE (PF) 100 MCG/2ML IJ SOLN
100 | INTRAMUSCULAR | Status: AC | PRN
Start: 2023-08-15 — End: 2023-08-15
  Administered 2023-08-15: 16:00:00 25 via INTRAVENOUS

## 2023-08-15 MED ORDER — MIDAZOLAM HCL 2 MG/2ML IJ SOLN
2 | INTRAMUSCULAR | Status: AC | PRN
Start: 2023-08-15 — End: 2023-08-15
  Administered 2023-08-15 (×2): 1 via INTRAVENOUS

## 2023-08-15 MED ORDER — LIDOCAINE VISCOUS HCL 2 % MT SOLN
2 | OROMUCOSAL | Status: AC | PRN
Start: 2023-08-15 — End: 2023-08-15
  Administered 2023-08-15: 16:00:00 15 via OROMUCOSAL

## 2023-08-15 MED ORDER — NALOXONE HCL 0.4 MG/ML IJ SOLN
0.4 | INTRAMUSCULAR | Status: AC
Start: 2023-08-15 — End: ?

## 2023-08-15 MED ORDER — FENTANYL CITRATE (PF) 100 MCG/2ML IJ SOLN
100 | INTRAMUSCULAR | Status: AC
Start: 2023-08-15 — End: ?

## 2023-08-15 MED FILL — MIDAZOLAM HCL 5 MG/5ML IJ SOLN: 5 mg/mL | INTRAMUSCULAR | Qty: 5

## 2023-08-15 MED FILL — FENTANYL CITRATE (PF) 100 MCG/2ML IJ SOLN: 100 MCG/2ML | INTRAMUSCULAR | Qty: 2

## 2023-08-15 MED FILL — NALOXONE HCL 0.4 MG/ML IJ SOLN: 0.4 mg/mL | INTRAMUSCULAR | Qty: 1

## 2023-08-15 MED FILL — FLUMAZENIL 0.5 MG/5ML IV SOLN: 0.5 MG/5ML | INTRAVENOUS | Qty: 5

## 2023-08-15 MED FILL — LIDOCAINE VISCOUS HCL 2 % MT SOLN: 2 % | OROMUCOSAL | Qty: 15

## 2023-08-16 LAB — TEE (PRN CONTRAST/BUBBLE/3D)
Ao Root Index: 2.17 cm/m2
Aortic Root: 4.1 cm
Body Surface Area: 1.91 m2

## 2023-08-29 NOTE — Telephone Encounter (Signed)
 Called the patient for Pre Watchman Education.   My role as Best boy explained.   Watchman discussed, Brochure provided and [employer] shared.   Nice Tool utilized and filled out.   SD/NICE Tool faxed to Dr Bethany per patient request. They discussed at last appointment.    Explained to the patient:  Part of the FDA approval of the Watchman procedure in 2015 mandated that each procedure must participate in a national registry, this requires several follow up appointments and testing following the procedure.      These expectations Include:      7-10 day follow up with the Nurse practitioner or Implanting Physician    45 day follow up lab work and TEE to check positioning of the device.      6 month follow up telephone call     1 year follow up appointment and lab work (TEE per Implanting physician discretion)    2  year follow up appointment and lab work .    Discussed the importance of blood thinners as prescribed and that the patient cannot come off those blood thinners until discontinued by the implanting physician.     Assessment/History of:    Nickel or metal allergy?  []  Yes     [x]  No    Contrast allergy?  []  Yes     [x]  No    Anesthesia issues?  []  Yes     [x]  No    Difficulty swallowing?  []  Yes     [x]  No    Do you have any procedures/surgeries planned that would interrupt Plavix and ASA for next 6 months?  [x]  Yes     []  No  Skin Cancer surgery this Thursday for place on his back.    History of Sleep Apnea?  [x]  Yes     []  No  Was told he had sleep apnea several years ago but couldn't find a mask that fits right.    Do you wear a CPAP?  []  Yes     [x]  No    Modified Rankin Scale:    [x]  0: No symptoms at all  []  1: No significant disability despite symptoms  []  2: Slight disability   []  3: Moderate disability  []  4: Moderately severe disability  []  5: Severe disability    []  Modified Rankin Not Administered      All questions and concerns answered.  Richardson to call and  schedule.  Will follow    Eleanor Monte RN  Watchman Coordinator  Electronically signed by Eleanor DELENA Monte, RN on 08/29/2023 at 3:10 PM [facility] Care Coordinator

## 2023-09-05 NOTE — Telephone Encounter (Addendum)
 Placed call to patient to schedule Watchman procedure. Left VM requesting return call to schedule.    Patient returned call. He will check with his driver for availability and will call back.

## 2023-09-05 NOTE — Telephone Encounter (Signed)
 Pt is returning call to [facility]

## 2023-09-07 ENCOUNTER — Telehealth

## 2023-09-07 NOTE — Telephone Encounter (Signed)
 *This consent is applicable for 30 days following patient signature*    PROCEDURAL INFORMED CONSENT FOR OPERATION / PROCEDURE    I (We), Dalton Warner authorize, Dr. Paulita Cosette Noble    and such assistants as may be selected by him/her, to perform the following operation/procedures:  Watchman Implant with transesophageal echocardiogram     Note: If unable to obtain consent prior to an emergent procedure, document the emergent reason in the medical record.   This procedure has been explained to my (our) satisfaction and included in the explanation was:   A) the intended benefit, nature, and extent of the procedure to be performed;   B) the significant risks involved and the probability of success;   C) alternative procedures and methods of treatment;   D) the dangers and probable consequences of such alternatives (including no procedure or treatment);   E) the expected consequences of the procedure on my future health;   F) whether other qualified individuals would be performing important surgical tasks and / or whether vendor representatives would be present to advise or support the procedure.     I (we) understand that there are other risks of infection and other serious complications in the pre-operative/procedural and postoperative/procedural stages of my (our) care.   I (we) have asked all of the questions which I (we) thought were important in deciding whether or not to undergo treatment or diagnosis. These questions have been answered to my (our) satisfaction.   I (we) understand that no assurance can be given that the procedure will be a success, and no guarantee or warranty of success has been given to me (us ).   2. It has been explained to me (us ) that during the course of the operation/procedure, unforeseen conditions may be revealed that necessitate extension of the original procedure(s) or different procedure(s) than those set forth in Paragraph 1. I (we) authorize and request that the  above-named physician, his/her assistants or his/her designees, perform procedures as necessary and desirable if deemed to be in my (our) best interest.     3. I acknowledge that other health care personnel may be observing this procedure for the purpose of medical education or other specified purposes as may be necessary as requested and/or approved by my (our) physician.     4. I (we) consent to the disposal by the hospital Pathologist of the removed tissue, parts or organs in accordance with hospital policy.     5. I do_____ do not______ consent to the use of a local infiltration pain blocking agent that will be used by my provider/surgical provider to help alleviate pain during my procedure.     6. I do_____ do not_____ consent to an emergent blood transfusion in the case of a life-threatening situation that requires blood components to be administered. This consent is valid for 24 hours from the beginning of the procedure.     7. This patient does _____ or does not ______currently have a DNR status/order. If DNR order is in place, obtain Addendum to the Surgical Consent for ALL Patients with a DNR Order to address peri-operative status for limited intervention or DNR suspension.     8. I have read and fully understand the above Consent for Operation/Procedure and that all blanks were completed before I signed the consent.     _____________________________________________ _______/______am/pm   Signature of Patient or legal representative Printed Name / Relationship Date / Time     _____________________________________________  _______/______ am/pm   Witness  to Signature Printed Name Date / Time       Informed consent:   I have provided the explanation described above in section 1 to the patient and/or legal representative. I have provided the patient and/or legal representative with an opportunity to ask any questions about the proposed operation/procedure.     ________________________________Dr. Paulita Cosette Noble   ______/______ am/pm   Provider / Proceduralist Printed Name Date / Time                                                                            [facility]     Dr. Paulita Cosette Noble    PROCEDURE: Watchman Implant    Date of Procedure: 09/22/2023 Time: 12 pm Arrival Time: 10 a.m.    Patient Name: Dalton Warner  DOB: February 17, 1930  MRN# [id number]    Day of Procedure Cath Lab Holding area Pre op Orders:    YOU HAVE MY PERMISSION TO TALK TO THE PATIENT-PLEASE   NPO after midnight  Anesthesia  TEE with Anesthesia  IV peripheral saline lock x 2 sites  (at least one in preferred left arm).  Type & Crossmatch with 1 units PRBCs setup and ready in procedure room for    rapid infusion time of procedure.  ANTICOAGULATION:   Hold Eliquis  evening dose the night before the procedure and the morning dose of the procedure  If taking Coumadin, PT INR  STAT on arrival day of procedure.   Male patients <=56 years of age >> urine HCG test.  Diabetic patients >> Accu check Blood sugar check.  Document home medications in EPIC and include date and time of last dose.  Notify Dr. Elder of abnormal lab results.  Chest Prep> Clip hair anterior chest and posterior back (clavicle to umbilicus).  Groin Prep> Clip hair bilateral groins for femoral access (medial).  If allergy to contrast >> pre treat for contrast allergy with Benadryl  25 mg IV, Solucortef 100 mg IV and Pepcid  20 mg IV 30 mins pre procedure.  If patient did not take daily dose of 81 mg aspirin this morning please give dose prior to procedure.   If not currently taking aspirin prior to admission please administer 325 mg Aspirin.    If Magnesium  level is between 1.6 to 1.8 then please given 2 gm of IV Magnesium  sulfate in 50cc IVPB over 2 hrs, If level is between 1.3 to 1.6 then given 3 grams of IV Magnesium  sulfate in 75CC IVPB over 3 hrs   If potassium is between 3 to 3.5 - please give 20meq of IV Potassium chloride in IVPB over 2  hrs        Physician Signature:___________________________Date:____________Time:___________                                                        [facility]     Dr. Paulita Cosette Noble    PROCEDURE: Watchman Implant    Date of Procedure: 09/22/2023 Time: 12 pm   Arrival Time:  10 a.m.    Patient Name: Dalton Warner  DOB: August 24, 1930  MRN# [id number]                                                                                                 HOSPITAL: [facility])    Call to Pre-Register at [phone]  2 days before your procedure  Please take your insurance card and I.D. with you to the hospital.    x Please have blood work and chest-x-ray done 09/19/2023 at [facility], [facility] or [facility].   x Please do not have anything by mouth after midnight prior to or 8 hours before the procedure.  x You may take your medications with a sip of water in the morning before your procedure or take them with you unless listed below.     X Hold Eliquis  the evening dose the day before procedure 09/21/2023 and the morning dose of the procedure 09/22/2023.   X Hold all Blood Pressure Medications morning of procedure    IMPORTANT NOTICE TO PATIENTS: Prior Authorization  We will contact your insurance for prior authorization for the CPT code related to the procedure it is the patient's responsibility to contact their insurance to ensure they are following their medical plans provisions or requirements!                                                      [facility]     Dr. Paulita Bar Rizvi     PATIENT NAME:    Dalton Warner                               DOB:   1930/06/06    PROCEDURE: Watchman Implant     DATE OF PROCEDURE: 09/22/2023    DIAGNOSIS:  I48.91, Z01.810, Z79.0                     X MAG    X   PHOS   X   BMP                     X CBC       X TYPE & SCREEN                    X     Chest -x-Ray PA & Lateral View       ?PLEASE CALL ABNORMAL RESULTS TO THE REQUESTING PHYSICIAN?    ATTENTION PATIENTS:  You do not have to fast for the lab work.   You must go to the [facility] or [facility]  to have the lab work done.

## 2023-09-19 ENCOUNTER — Ambulatory Visit: Admit: 2023-09-19 | Discharge: 2023-09-19 | Payer: MEDICARE | Primary: Family

## 2023-09-19 ENCOUNTER — Inpatient Hospital Stay: Admit: 2023-09-19 | Payer: MEDICARE | Primary: Family

## 2023-09-19 DIAGNOSIS — I480 Paroxysmal atrial fibrillation: Principal | ICD-10-CM

## 2023-09-19 DIAGNOSIS — Z01810 Encounter for preprocedural cardiovascular examination: Principal | ICD-10-CM

## 2023-09-19 LAB — CBC
Hematocrit: 33.5 % — ABNORMAL LOW (ref 42.0–52.0)
Hemoglobin: 10.8 g/dL — ABNORMAL LOW (ref 13.5–18.0)
MCH: 27.4 pg (ref 27.0–31.0)
MCHC: 32.2 g/dL (ref 32.0–36.0)
MCV: 85 fL (ref 78.0–100.0)
Platelet, Fluorescence: 88 k/uL — ABNORMAL LOW (ref 140–440)
RBC: 3.94 m/uL — ABNORMAL LOW (ref 4.60–6.20)
RDW: 20.9 % — ABNORMAL HIGH (ref 11.7–14.9)
WBC: 9.2 k/uL (ref 4.0–10.5)

## 2023-09-19 LAB — BASIC METABOLIC PANEL
Anion Gap: 12 mmol/L (ref 9–17)
BUN: 17 mg/dL (ref 7–20)
CO2: 22 mmol/L (ref 21–32)
Calcium: 9.1 mg/dL (ref 8.3–10.6)
Chloride: 95 mmol/L — ABNORMAL LOW (ref 99–110)
Creatinine: 1 mg/dL (ref 0.8–1.3)
Est, Glom Filt Rate: 71 mL/min/1.73m2 (ref >60)
Glucose: 103 mg/dL — ABNORMAL HIGH (ref 74–99)
Potassium: 4.7 mmol/L (ref 3.5–5.1)
Sodium: 129 mmol/L — ABNORMAL LOW (ref 136–145)

## 2023-09-19 LAB — PLATELET CONFIRMATION

## 2023-09-19 LAB — PHOSPHORUS: Phosphorus: 3.5 mg/dL (ref 2.5–4.9)

## 2023-09-19 LAB — MAGNESIUM: Magnesium: 2 mg/dL (ref 1.8–2.4)

## 2023-09-19 NOTE — Telephone Encounter (Signed)
 Placed call to patient and informed him of Dr. Graceann response.

## 2023-09-19 NOTE — Progress Notes (Signed)
 Patient was here in office & educated on [facility]  for Dx: afib.  Procedure is scheduled for 09/22/2023 @ 12, w/arrival @ 10, @ [facility]. Pre-admission orders were given to patient for labs & CXR, which are due 09/19/2023 @ [facility].       Procedure and risks were explained to patient. Consent forms were signed.  Instructions were given to patient to remain NPO after midnight the night before procedure.  Patient may take morning meds the morning of procedure with small amount of water.     Patient was notified that procedure could be delayed due to an emergency. Patient voiced understanding. Copies of consent, pre-testing orders, & instructions scanned into media

## 2023-09-19 NOTE — Telephone Encounter (Signed)
-----   Message from Dr. Paulita Cosette Noble, MD sent at 09/19/2023  3:18 PM EDT -----  Regarding: RE: prophylactic Amoxicillin  Yes  HE will get antibiotics IV before procedure   That is standard for all  procedure getting watchman  ----- Message -----  From: Juli Dire, RN  Sent: 09/19/2023   2:28 PM EDT  To: Paulita Cosette Noble, MD  Subject: prophylactic Amoxicillin                         Hello. Mr. Ralph is scheduled for Watchman this week -14th.  He is concerned about need to take Amoxicillin before any procedure. He has history of joint replacement.  I told him I would ask and call him back.

## 2023-09-20 NOTE — Anesthesia Pre-Procedure Evaluation (Addendum)
 Department of Anesthesiology  Preprocedure Note       Name:  Dalton Warner   Age:  89 y.o.  DOB:  [birth year]                                          MRN:  [id number]         Date:  09/20/2023      Surgeon: Clotilde):  Cosette Selestine Rank, MD    Procedure: Procedure(s):  Watchman laa closure device    Medications prior to admission:   Prior to Admission medications   Medication Sig Start Date End Date Taking? Authorizing Provider   fluorouracil (EFUDEX) 5 % cream Apply TOPICALLY TO skin cancer on left arm TWICE DAILY FOR 6 weeks. Wash hands after application. 07/28/23   [provider]   apixaban  (ELIQUIS ) 2.5 MG TABS tablet Take 1 tablet by mouth 2 times daily 01/13/23   Bertell Clarine Nurse, MD   atorvastatin  (LIPITOR ) 40 MG tablet Take 1 tablet by mouth daily 01/13/23   Bertell Clarine Nurse, MD   metoprolol  succinate (TOPROL  XL) 50 MG extended release tablet Take 1 tablet by mouth nightly 01/13/23   Alam, Mian Bilal, MD   SYSTANE 0.4-0.3 % ophthalmic solution  06/08/22   [provider]   ketoconazole (NIZORAL) 2 % shampoo  01/07/20   [provider]   ketoconazole (NIZORAL) 2 % cream  01/07/20   [provider]   ammonium lactate (AMLACTIN) 12 % cream  08/27/19   [provider]   cetirizine  (ZYRTEC ) 10 MG tablet Take 1 tablet by mouth daily    [provider]   pantoprazole  (PROTONIX ) 40 MG tablet Take 1 tablet by mouth 2 times daily (before meals) 03/05/19   Eziokwu, Akaolisa, MD   guaiFENesin  (MUCINEX ) 600 MG extended release tablet Take 2 tablets by mouth 2 times daily    [provider]   alfuzosin (UROXATRAL) 10 MG extended release tablet Take 1 tablet by mouth daily    [provider]   albuterol sulfate (PROAIR RESPICLICK) 108 (90 Base) MCG/ACT aerosol powder inhalation 2 puffs every 6 hours as needed    [provider]   montelukast  (SINGULAIR ) 10 MG tablet 1 tablet nightly 01/09/18   [provider]       Current medications:     No current facility-administered medications for this encounter.     Current Outpatient Medications   Medication Sig Dispense Refill   . fluorouracil (EFUDEX) 5 % cream Apply TOPICALLY TO skin cancer on left arm TWICE DAILY FOR 6 weeks. Wash hands after application.     . apixaban  (ELIQUIS ) 2.5 MG TABS tablet Take 1 tablet by mouth 2 times daily 180 tablet 3   . atorvastatin  (LIPITOR ) 40 MG tablet Take 1 tablet by mouth daily 30 tablet 5   . metoprolol  succinate (TOPROL  XL) 50 MG extended release tablet Take 1 tablet by mouth nightly 90 tablet 3   . SYSTANE 0.4-0.3 % ophthalmic solution      . ketoconazole (NIZORAL) 2 % shampoo      . ketoconazole (NIZORAL) 2 % cream      . ammonium lactate (AMLACTIN) 12 % cream      . cetirizine  (ZYRTEC ) 10 MG tablet Take 1 tablet by mouth daily     . pantoprazole  (PROTONIX ) 40 MG tablet Take 1 tablet by  mouth 2 times daily (before meals) 60 tablet 2   . guaiFENesin  (MUCINEX ) 600 MG extended release tablet Take 2 tablets by mouth 2 times daily     . alfuzosin (UROXATRAL) 10 MG extended release tablet Take 1 tablet by mouth daily     . albuterol sulfate (PROAIR RESPICLICK) 108 (90 Base) MCG/ACT aerosol powder inhalation 2 puffs every 6 hours as needed     . montelukast  (SINGULAIR ) 10 MG tablet 1 tablet nightly         Allergies:    Allergies   Allergen Reactions   . Cat Dander Other (See Comments)     Runny nose   . Dust Mite Extract Other (See Comments)     Runny nose   . Other      Other reaction(s): Other (See Comments)  Dust/cats - sneezing   . Fish Allergy Nausea And Vomiting     Salmon only  Salmon only       Problem List:    Patient Active Problem List   Diagnosis Code   . Thrombocytopenia D69.6   . History of arthroplasty of right shoulder Z96.611   . Severe anemia D64.9   . Anemia D64.9   . Essential hypertension I10   . Atrial fibrillation (HCC) I48.91   . Monoclonal gammopathy D47.2   . SSS (sick sinus syndrome) (HCC) I49.5   . Dyslipidemia E78.5   . Mural thrombus of  left ventricle I51.3   . Pacemaker Z95.0   . Paroxysmal atrial fibrillation (HCC) I48.0       Past Medical History:        Diagnosis Date   . Arthritis    . Atrial fibrillation (HCC)    . Bleeding ulcer 08/2018   . Cancer [facility])     Prostate cancer dx March 2010- tx with radiation    . Colon cancer (HCC) 2000    per old chart dx with colon ca - tx with surgery only   . Deficient knowledge of transesophageal echocardiography (TEE) 08/15/2023   . Diabetes mellitus (HCC)     was borderline- at one time was on Metformin for several yrs-  when I went to [facility] they had me stop this in 2017   . Fracture     had broken neck in Nov 2016- wore a collar for 6 weeks only   . HOH (hard of hearing)     bil hearing aide   . Hx of blood clots     had blood clot - it was a  blood clot in  my esophagus    . Hx of Doppler ultrasound 09/19/2019    EF IS 55-60%  Bi atrial enlargement noted.  Moderate mitral, aortic, tricuspid and mild pulmonic regurgitation is present   . Hx of fall     last fall 2016- use cane walker and have electric chair   . Hyperlipidemia    . Hypertension     follow with Dr Orvel at [facility]   . Limited range of motion (ROM) of shoulder     right shoulder haved 10 % usage and left shoulder 60 % of usage   . Neuropathy     both feet   . Sleep apnea     had sleep study- tried to give me cpap but could not tolerate it    . TIA (transient ischemic attack)     'in Jan 2009- trouble with speech, numbness of lip, weakness right arm - all only  lasted for 20 seconds   . Unsteady gait     hx of back problem- was on steroids for 4 yrs- since then stability issues with my back -    . Wears glasses     to read        Past Surgical History:        Procedure Laterality Date   . ABDOMEN SURGERY  2000    per old chart had right hemicolectomy removed 2 and a half feet of colon and my appendix   . COLONOSCOPY  2019   . CT BIOPSY BONE MARROW  11/02/2016    CT BONE MARROW BIOPSY 11/02/2016   . ENDOSCOPY,  COLON, DIAGNOSTIC      per old chart had EGD done 08/2018 and 09/2018   . HERNIA REPAIR      per old chart open left ing hernia 1980 and then recurrent left ing hernia repair 08/2018   . JOINT REPLACEMENT      per old chart total left knee 2018   . PACEMAKER PLACEMENT      per old chart hx of st jude pacer insertion 2008- new pacer inserted 2019   . SHOULDER SURGERY Left 2015    rota. cuff   . SHOULDER SURGERY Right 02/27/2019    RIGHT REVERSE SHOULDER TOTAL ARTHROPLASTY REVERSE performed by Chauncey Gladis Hummer, MD at [facility] OR       Social History:    Social History     Tobacco Use   . Smoking status: Never   . Smokeless tobacco: Never   Substance Use Topics   . Alcohol use: Yes     Comment: maybe 6 times per year when had prostate cancer did drink one glass red wine daily in the past  1cup coffee                                Counseling given: Not Answered      Vital Signs (Current):   There were no vitals filed for this visit.                                             BP Readings from Last 3 Encounters:   08/15/23 138/71   08/08/23 121/74   08/05/23 136/74       NPO Status:                                                                                 BMI:   Wt Readings from Last 3 Encounters:   08/15/23 77.1 kg (170 lb)   08/05/23 77.1 kg (170 lb)   08/02/23 77.1 kg (170 lb)         CBC  Lab Results   Component Value Date/Time    WBC 9.2 09/19/2023 02:55 PM    HGB 10.8 (L) 09/19/2023 02:55 PM    HCT 33.5 (L) 09/19/2023 02:55 PM    PLT 86 (L) 07/29/2022 07:20 AM     RENAL  Lab Results  Component Value Date/Time    NA 129 (L) 09/19/2023 02:55 PM    K 4.7 09/19/2023 02:55 PM    CL 95 (L) 09/19/2023 02:55 PM    CO2 22 09/19/2023 02:55 PM    BUN 17 09/19/2023 02:55 PM    CREATININE 1.0 09/19/2023 02:55 PM    GLUCOSE 103 (H) 09/19/2023 02:55 PM       COVID-19 Screening (If Applicable):   Lab Results   Component Value Date/Time    COVID19 NOT DETECTED 03/05/2019 10:15 AM    COVID19 NOT DETECTED 02/20/2019 12:33 PM          Anesthesia Evaluation    Airway:           Dental:          Pulmonary:   (+)  COPD:    sleep apnea:                                  Cardiovascular:    (+) hypertension:, pacemaker: pacemaker, dysrhythmias (chronic AF):, hyperlipidemia               ROS comment: Echo 2025  Left Ventricle: Normal left ventricular systolic function.    Left Atrium: No left atrial appendage thrombus noted. LAA neck (landing zone) measures 1.56 cm at 0 degrees. LAA neck (landing zone) measures 1.04 cm at 45 degrees. LAA neck (landing zone) measures 1.08 cm at 90 degrees. LAA neck (landing zone) measures 1.44 cm at 135 degrees.    Right heart is dilated.    Interatrial Septum: Agitated saline study was negative with and without provocation.    Aortic Valve: Thickened cusps. Mild regurgitation.    Tricuspid Valve: Moderate to severe regurgitation.    Aorta: Dilated aortic root. Ao root diameter is 4.1 cm.    Pericardium: No pericardial effusion.    Image quality is good.       Stress test 09/2017  Nuclear Cardiology Conclusion:  Normal Regadenoson myocardial perfusion with Tc-32m sestamibi imaging.  No convincing evidence of inducible ischemia identified. Global left  ventricular systolic function was normal, with an EF of 66%. Compared to  the prior study (02/27/2010), no clinically important changes are noted.          PE comment: paced   Neuro/Psych:   (+) neuromuscular disease:, TIA            GI/Hepatic/Renal:   (+) PUD, renal disease (BPH, prostate CA, 2010, treated with radiation):         ROS comment: Diverticulitis s/p partial colectomy, 2000  .   Endo/Other:    (+) Diabetes, blood dyscrasia: arthritis:., malignancy/cancer (skin and prostate).                  ROS comment: Right shoulder rotator cuff capsule with pain, limited ROM  S/p left rotator cuff repair, 2015    Recurrent left inguinal hernia, repaired 1980, last with mesh, 08/2018 Abdominal:             Vascular:          Other Findings:              Anesthesia Plan      general     ASA 4       Induction: intravenous.                      Roslynn Stager, APRN -  CRNA  09/20/2023

## 2023-09-21 NOTE — Telephone Encounter (Addendum)
 Watchman pre call done.    Medication changes made:    DOAC: Take Eliquis  Tonight. Hold in am only. (Verified with Dr Selestine)    Patient verbalizes understanding of these changes    Insurance approval obtained:  [x]  Yes     []  No    Shared Decision  Date obtained:09/05/2023  Physician: Dr Bethany  []  Office     [x]  Media tab    [x]  Follow ups made (1 week, 6 months, 1 year)    Patient verbalizes understanding and all questions verbalized answered.    Patient states he did have blood clots in his legs twice in 2014 and 2015 in [place] This is not in his EPIC history.  Dr Selestine notified.    Will follow.    Electronically signed by Eleanor DELENA Monte, RN on 09/21/2023 at 3:06 PM [facility] Coordinator

## 2023-09-21 NOTE — Telephone Encounter (Signed)
 Watchman pre call done.    Medication changes made:    DOAC: Eliquis      Patient verbalizes understanding of these changes    Insurance approval obtained:  []  Yes     []  No    Shared Decision  Date obtained:  Physician:  []  Office     []  Media tab    []  Discussed discharge instructions    []  Discussed Incentive Spirometer    []  Follow ups made (1 week, 6 months, 1 year)    Patient verbalizes understanding and all questions verbalized answered.  Will follow.    Electronically signed by Eleanor DELENA Monte, RN on 09/21/2023 at 1:45 PM [employer] Care Coordinator

## 2023-09-22 ENCOUNTER — Inpatient Hospital Stay
Admit: 2023-09-22 | Discharge: 2023-09-23 | Disposition: A | Discharge status: Home / Self Care | Payer: MEDICARE | Attending: Cardiovascular Disease | Admitting: Cardiovascular Disease

## 2023-09-22 ENCOUNTER — Ambulatory Visit: Admit: 2023-09-22 | Discharge: 2023-09-26 | Payer: MEDICARE | Primary: Family

## 2023-09-22 ENCOUNTER — Inpatient Hospital Stay: Admit: 2023-09-22 | Discharge: 2023-09-26 | Payer: MEDICARE | Primary: Family

## 2023-09-22 DIAGNOSIS — I480 Paroxysmal atrial fibrillation: Principal | ICD-10-CM

## 2023-09-22 LAB — ECHO (TTE) LIMITED (PRN CONTRAST/BUBBLE/STRAIN/3D)
AR Max Velocity PISA: 2.5 m/s
AR PHT: 378 ms
AV Area by Peak Velocity: 1 cm2
AV Area by VTI: 1.1 cm2
AV Mean Gradient: 9 mmHg
AV Mean Velocity: 1.4 m/s
AV Peak Gradient: 16 mmHg
AV Peak Velocity: 2 m/s
AV VTI: 45 cm
AV Velocity Ratio: 0.35
AVA/BSA Peak Velocity: 0.5 cm2/m2
AVA/BSA VTI: 0.6 cm2/m2
Ao Root Index: 2.17 cm/m2
Aortic Root: 4.1 cm
Body Surface Area: 1.91 m2
EF Physician: 55 %
Est. RA Pressure: 8 mmHg
Fractional Shortening 2D: 29 % (ref 28–44)
IVC Proxmal: 2.2 cm
IVSd: 1 cm (ref 0.6–1.0)
LV EDV A4C: 78 mL
LV EDV Index A4C: 41 mL/m2
LV ESV A4C: 29 mL
LV ESV Index A4C: 15 mL/m2
LV Ejection Fraction A4C: 63 %
LV Mass 2D Index: 65.1 g/m2 (ref 49–115)
LV Mass 2D: 123 g (ref 88–224)
LV RWT Ratio: 0.44
LVIDd Index: 2.17 cm/m2
LVIDd: 4.1 cm — AB (ref 4.2–5.9)
LVIDs Index: 1.53 cm/m2
LVIDs: 2.9 cm
LVOT Area: 2.8 cm2
LVOT Diameter: 1.9 cm
LVOT Mean Gradient: 2 mmHg
LVOT Peak Gradient: 2 mmHg
LVOT Peak Velocity: 0.7 m/s
LVOT SV: 50.4 mL
LVOT Stroke Volume Index: 26.7 mL/m2
LVOT VTI: 17.8 cm
LVOT:AV VTI Index: 0.4
LVPWd: 0.9 cm (ref 0.6–1.0)
RVSP: 55 mmHg
TR Max Velocity: 3.43 m/s
TR Peak Gradient: 47 mmHg

## 2023-09-22 LAB — SODIUM: Sodium: 134 mmol/L — ABNORMAL LOW (ref 136–145)

## 2023-09-22 LAB — TYPE AND SCREEN
ABO/Rh: A POS
ABO/Rh: A POS
Antibody Screen: NEGATIVE
Antibody Screen: NEGATIVE
Unit Divison: 0
Unit Divison: 0

## 2023-09-22 LAB — POC ACT-LR: Activated Clotting Time, Low Range: 348 s — ABNORMAL HIGH (ref 89–169)

## 2023-09-22 LAB — ECHO COMPLETE

## 2023-09-22 LAB — TEE (PRN CONTRAST/BUBBLE/3D): Body Surface Area: 1.91 m2

## 2023-09-22 LAB — ELECTROPHYSIOLOGY PROCEDURE: Body Surface Area: 1.91 m2

## 2023-09-22 LAB — POCT GLUCOSE: POC Glucose: 107 mg/dL — ABNORMAL HIGH (ref 74–99)

## 2023-09-22 MED ORDER — ONDANSETRON HCL 4 MG/2ML IJ SOLN
4 | Freq: Once | INTRAMUSCULAR | Status: DC | PRN
Start: 2023-09-22 — End: 2023-09-22
  Administered 2023-09-22: 17:00:00 4 via INTRAVENOUS

## 2023-09-22 MED ORDER — CEFAZOLIN SODIUM 1 G IJ SOLR
1 | Freq: Once | INTRAMUSCULAR | Status: DC | PRN
Start: 2023-09-22 — End: 2023-09-22
  Administered 2023-09-22: 17:00:00 2 via INTRAVENOUS

## 2023-09-22 MED ORDER — OXYCODONE HCL 5 MG PO TABS
5 | Freq: Once | ORAL | Status: DC | PRN
Start: 2023-09-22 — End: 2023-09-22

## 2023-09-22 MED ORDER — NORMAL SALINE FLUSH 0.9 % IV SOLN
0.9 | INTRAVENOUS | Status: DC | PRN
Start: 2023-09-22 — End: 2023-09-22

## 2023-09-22 MED ORDER — PROPOFOL 200 MG/20ML IV EMUL
200 | Freq: Once | INTRAVENOUS | Status: DC | PRN
Start: 2023-09-22 — End: 2023-09-22
  Administered 2023-09-22: 17:00:00 20 via INTRAVENOUS
  Administered 2023-09-22: 17:00:00 130 via INTRAVENOUS

## 2023-09-22 MED ORDER — CLOPIDOGREL BISULFATE 300 MG PO TABS
300 | ORAL | Status: AC
Start: 2023-09-22 — End: 2023-09-22

## 2023-09-22 MED ORDER — ONDANSETRON HCL 4 MG/2ML IJ SOLN
4 | Freq: Once | INTRAMUSCULAR | Status: DC | PRN
Start: 2023-09-22 — End: 2023-09-22

## 2023-09-22 MED ORDER — LABETALOL HCL 5 MG/ML IV SOLN
5 | INTRAVENOUS | Status: AC
Start: 2023-09-22 — End: 2023-09-22

## 2023-09-22 MED ORDER — IOPAMIDOL 76 % IV SOLN
76 | INTRAVENOUS | Status: DC | PRN
Start: 2023-09-22 — End: 2023-09-22
  Administered 2023-09-22: 18:00:00 20 via INTRACARDIAC

## 2023-09-22 MED ORDER — SODIUM CHLORIDE 0.9 % IV SOLN
0.9 | INTRAVENOUS | Status: AC | PRN
Start: 2023-09-22 — End: 2023-09-22
  Administered 2023-09-22: 17:00:00 via INTRAVENOUS

## 2023-09-22 MED ORDER — CEFAZOLIN SODIUM 1 G IJ SOLR
1 | INTRAMUSCULAR | Status: AC
Start: 2023-09-22 — End: 2023-09-22

## 2023-09-22 MED ORDER — HEPARIN SODIUM (PORCINE) 1000 UNIT/ML IJ SOLN
1000 | Freq: Once | INTRAMUSCULAR | Status: DC | PRN
Start: 2023-09-22 — End: 2023-09-22
  Administered 2023-09-22: 18:00:00 6000 via INTRAVENOUS

## 2023-09-22 MED ORDER — CETIRIZINE HCL 10 MG PO TABS
10 | Freq: Every day | ORAL | Status: DC
Start: 2023-09-22 — End: 2023-09-23
  Administered 2023-09-22 – 2023-09-23 (×2): 10 mg via ORAL

## 2023-09-22 MED ORDER — IOPAMIDOL 76 % IV SOLN
76 | INTRAVENOUS | Status: AC
Start: 2023-09-22 — End: 2023-09-22

## 2023-09-22 MED ORDER — DEXAMETHASONE SODIUM PHOSPHATE 4 MG/ML IJ SOLN
4 | Freq: Once | INTRAMUSCULAR | Status: DC | PRN
Start: 2023-09-22 — End: 2023-09-22
  Administered 2023-09-22: 17:00:00 4 via INTRAVENOUS

## 2023-09-22 MED ORDER — SODIUM CHLORIDE 0.9 % IV SOLN
0.9 | INTRAVENOUS | Status: DC | PRN
Start: 2023-09-22 — End: 2023-09-22
  Administered 2023-09-22: 18:00:00 15 via INTRAVENOUS

## 2023-09-22 MED ORDER — LABETALOL HCL 5 MG/ML IV SOLN
5 | Freq: Once | INTRAVENOUS | Status: DC | PRN
Start: 2023-09-22 — End: 2023-09-22
  Administered 2023-09-22: 19:00:00 10 via INTRAVENOUS

## 2023-09-22 MED ORDER — HYDRALAZINE HCL 20 MG/ML IJ SOLN
20 | INTRAMUSCULAR | Status: DC | PRN
Start: 2023-09-22 — End: 2023-09-22

## 2023-09-22 MED ORDER — PROTAMINE SULFATE 10 MG/ML IV SOLN
10 | Freq: Once | INTRAVENOUS | Status: DC | PRN
Start: 2023-09-22 — End: 2023-09-22
  Administered 2023-09-22: 18:00:00 20 via INTRAVENOUS

## 2023-09-22 MED ORDER — LIDOCAINE HCL (PF) 2 % IJ SOLN
2 | Freq: Once | INTRAMUSCULAR | Status: DC | PRN
Start: 2023-09-22 — End: 2023-09-22
  Administered 2023-09-22: 17:00:00 60 via INTRAVENOUS

## 2023-09-22 MED ORDER — SODIUM CHLORIDE 0.9 % IV SOLN
0.9 | INTRAVENOUS | Status: DC | PRN
Start: 2023-09-22 — End: 2023-09-23

## 2023-09-22 MED ORDER — ATORVASTATIN CALCIUM 40 MG PO TABS
40 | Freq: Every day | ORAL | Status: DC
Start: 2023-09-22 — End: 2023-09-23
  Administered 2023-09-22 – 2023-09-23 (×2): 40 mg via ORAL

## 2023-09-22 MED ORDER — POTASSIUM CHLORIDE 10 MEQ/100ML IV SOLN
10 | INTRAVENOUS | Status: DC | PRN
Start: 2023-09-22 — End: 2023-09-23

## 2023-09-22 MED ORDER — MONTELUKAST SODIUM 10 MG PO TABS
10 | Freq: Every evening | ORAL | Status: DC
Start: 2023-09-22 — End: 2023-09-23
  Administered 2023-09-23: 02:00:00 10 mg via ORAL

## 2023-09-22 MED ORDER — HYDROMORPHONE HCL 1 MG/ML IJ SOLN
1 | INTRAMUSCULAR | Status: DC | PRN
Start: 2023-09-22 — End: 2023-09-22

## 2023-09-22 MED ORDER — HEPARIN SODIUM (PORCINE) 1000 UNIT/ML IJ SOLN
1000 | INTRAMUSCULAR | Status: AC
Start: 2023-09-22 — End: 2023-09-22

## 2023-09-22 MED ORDER — ONDANSETRON HCL 4 MG/2ML IJ SOLN
4 | Freq: Four times a day (QID) | INTRAMUSCULAR | Status: DC | PRN
Start: 2023-09-22 — End: 2023-09-23

## 2023-09-22 MED ORDER — NORMAL SALINE FLUSH 0.9 % IV SOLN
0.9 | INTRAVENOUS | Status: DC | PRN
Start: 2023-09-22 — End: 2023-09-23

## 2023-09-22 MED ORDER — ACETAMINOPHEN 325 MG PO TABS
325 | ORAL | Status: DC | PRN
Start: 2023-09-22 — End: 2023-09-23

## 2023-09-22 MED ORDER — SUGAMMADEX SODIUM 200 MG/2ML IV SOLN
200 | INTRAVENOUS | Status: AC
Start: 2023-09-22 — End: 2023-09-22

## 2023-09-22 MED ORDER — GUAIFENESIN ER 600 MG PO TB12
600 | Freq: Two times a day (BID) | ORAL | Status: DC
Start: 2023-09-22 — End: 2023-09-23
  Administered 2023-09-23 (×2): 1200 mg via ORAL

## 2023-09-22 MED ORDER — LIDOCAINE HCL (PF) 2 % IJ SOLN
2 | INTRAMUSCULAR | Status: AC
Start: 2023-09-22 — End: 2023-09-22

## 2023-09-22 MED ORDER — ROCURONIUM BROMIDE 50 MG/5ML IV SOLN
50 | Freq: Once | INTRAVENOUS | Status: DC | PRN
Start: 2023-09-22 — End: 2023-09-22
  Administered 2023-09-22: 17:00:00 50 via INTRAVENOUS

## 2023-09-22 MED ORDER — SUGAMMADEX SODIUM 200 MG/2ML IV SOLN
200 | Freq: Once | INTRAVENOUS | Status: DC | PRN
Start: 2023-09-22 — End: 2023-09-22
  Administered 2023-09-22: 18:00:00 200 via INTRAVENOUS

## 2023-09-22 MED ORDER — DEXAMETHASONE SODIUM PHOSPHATE 4 MG/ML IJ SOLN
4 | INTRAMUSCULAR | Status: AC
Start: 2023-09-22 — End: 2023-09-22

## 2023-09-22 MED ORDER — ASPIRIN 81 MG PO CHEW
81 | Freq: Every day | ORAL | Status: DC
Start: 2023-09-22 — End: 2023-09-23
  Administered 2023-09-23: 12:00:00 81 mg via ORAL

## 2023-09-22 MED ORDER — ASPIRIN 81 MG PO CHEW
81 | ORAL | Status: AC
Start: 2023-09-22 — End: 2023-09-22

## 2023-09-22 MED ORDER — LIDOCAINE HCL 2 % IJ SOLN
2 | INTRAMUSCULAR | Status: AC
Start: 2023-09-22 — End: 2023-09-22

## 2023-09-22 MED ORDER — ARTIFICIAL TEARS OP SOLN
OPHTHALMIC | Status: DC | PRN
Start: 2023-09-22 — End: 2023-09-23

## 2023-09-22 MED ORDER — ONDANSETRON HCL 4 MG/2ML IJ SOLN
4 | INTRAMUSCULAR | Status: AC
Start: 2023-09-22 — End: 2023-09-22

## 2023-09-22 MED ORDER — TAMSULOSIN HCL 0.4 MG PO CAPS
0.4 | Freq: Every day | ORAL | Status: DC
Start: 2023-09-22 — End: 2023-09-23
  Administered 2023-09-22 – 2023-09-23 (×2): 0.4 mg via ORAL

## 2023-09-22 MED ORDER — PROTAMINE SULFATE 10 MG/ML IV SOLN
10 | INTRAVENOUS | Status: AC
Start: 2023-09-22 — End: 2023-09-22

## 2023-09-22 MED ORDER — SODIUM CHLORIDE 0.9 % IV SOLN
0.9 | INTRAVENOUS | Status: DC | PRN
Start: 2023-09-22 — End: 2023-09-22

## 2023-09-22 MED ORDER — ENOXAPARIN SODIUM 40 MG/0.4ML IJ SOSY
40 | Freq: Every day | INTRAMUSCULAR | Status: DC
Start: 2023-09-22 — End: 2023-09-22

## 2023-09-22 MED ORDER — MEPERIDINE HCL 25 MG/ML IJ SOLN
25 | INTRAMUSCULAR | Status: DC | PRN
Start: 2023-09-22 — End: 2023-09-22

## 2023-09-22 MED ORDER — ONDANSETRON 4 MG PO TBDP
4 | Freq: Three times a day (TID) | ORAL | Status: DC | PRN
Start: 2023-09-22 — End: 2023-09-23

## 2023-09-22 MED ORDER — ALBUTEROL SULFATE HFA 108 (90 BASE) MCG/ACT IN AERS
108 | Freq: Four times a day (QID) | RESPIRATORY_TRACT | Status: DC | PRN
Start: 2023-09-22 — End: 2023-09-23

## 2023-09-22 MED ORDER — FENTANYL CITRATE (PF) 100 MCG/2ML IJ SOLN
100 | INTRAMUSCULAR | Status: DC | PRN
Start: 2023-09-22 — End: 2023-09-22

## 2023-09-22 MED ORDER — ENOXAPARIN SODIUM 40 MG/0.4ML IJ SOSY
40 | Freq: Every day | INTRAMUSCULAR | Status: DC
Start: 2023-09-22 — End: 2023-09-23
  Administered 2023-09-23: 02:00:00 40 mg via SUBCUTANEOUS

## 2023-09-22 MED ORDER — SODIUM CHLORIDE (PF) 0.9 % IJ SOLN
0.9 | INTRAMUSCULAR | Status: DC | PRN
Start: 2023-09-22 — End: 2023-09-22

## 2023-09-22 MED ORDER — ACETAMINOPHEN 325 MG PO TABS
325 | Freq: Four times a day (QID) | ORAL | Status: DC | PRN
Start: 2023-09-22 — End: 2023-09-22

## 2023-09-22 MED ORDER — POTASSIUM BICARB-CITRIC ACID 20 MEQ PO TBEF
20 | ORAL | Status: DC | PRN
Start: 2023-09-22 — End: 2023-09-23

## 2023-09-22 MED ORDER — NORMAL SALINE FLUSH 0.9 % IV SOLN
0.9 | Freq: Two times a day (BID) | INTRAVENOUS | Status: DC
Start: 2023-09-22 — End: 2023-09-23
  Administered 2023-09-23: 12:00:00 20 mL via INTRAVENOUS

## 2023-09-22 MED ORDER — ACETAMINOPHEN 650 MG RE SUPP
650 | Freq: Four times a day (QID) | RECTAL | Status: DC | PRN
Start: 2023-09-22 — End: 2023-09-23

## 2023-09-22 MED ORDER — PROPOFOL 200 MG/20ML IV EMUL
200 | INTRAVENOUS | Status: AC
Start: 2023-09-22 — End: 2023-09-22

## 2023-09-22 MED ORDER — DROPERIDOL 2.5 MG/ML IJ SOLN
2.5 | Freq: Once | INTRAMUSCULAR | Status: DC | PRN
Start: 2023-09-22 — End: 2023-09-22

## 2023-09-22 MED ORDER — POLYETHYLENE GLYCOL 3350 17 G PO PACK
17 | Freq: Every day | ORAL | Status: DC | PRN
Start: 2023-09-22 — End: 2023-09-23

## 2023-09-22 MED ORDER — CLOPIDOGREL BISULFATE 75 MG PO TABS
75 | Freq: Every day | ORAL | Status: DC
Start: 2023-09-22 — End: 2023-09-23
  Administered 2023-09-23: 12:00:00 75 mg via ORAL

## 2023-09-22 MED ORDER — PHENYLEPHRINE HCL 10 MG/ML SOLN (MIXTURES ONLY)
10 | Freq: Once | Status: DC | PRN
Start: 2023-09-22 — End: 2023-09-22
  Administered 2023-09-22: 18:00:00 100 via INTRAVENOUS

## 2023-09-22 MED ORDER — CLOPIDOGREL BISULFATE 300 MG PO TABS
300 | Freq: Once | ORAL | Status: DC
Start: 2023-09-22 — End: 2023-09-22

## 2023-09-22 MED ORDER — NORMAL SALINE FLUSH 0.9 % IV SOLN
0.9 | Freq: Two times a day (BID) | INTRAVENOUS | Status: DC
Start: 2023-09-22 — End: 2023-09-22

## 2023-09-22 MED ORDER — MAGNESIUM SULFATE 2000 MG/50 ML IVPB PREMIX
2 | INTRAVENOUS | Status: DC | PRN
Start: 2023-09-22 — End: 2023-09-23

## 2023-09-22 MED ORDER — ASPIRIN 81 MG PO CHEW
81 | Freq: Every day | ORAL | Status: DC
Start: 2023-09-22 — End: 2023-09-22

## 2023-09-22 MED ORDER — HEPARIN (PORCINE) IN NACL IRRIGATION 1000-0.9 UT/500ML-% SOLN
1000-0.9 | INTRAVENOUS | Status: DC | PRN
Start: 2023-09-22 — End: 2023-09-22
  Administered 2023-09-22: 17:00:00 1500

## 2023-09-22 MED ORDER — HEPARIN (PORCINE) IN NACL 1000-0.9 UT/500ML-% IV SOLN
1000-0.9 | INTRAVENOUS | Status: AC
Start: 2023-09-22 — End: 2023-09-22

## 2023-09-22 MED ORDER — POTASSIUM CHLORIDE CRYS ER 20 MEQ PO TBCR
20 | ORAL | Status: DC | PRN
Start: 2023-09-22 — End: 2023-09-23

## 2023-09-22 MED ORDER — ASPIRIN 81 MG PO CHEW
81 | Freq: Every day | ORAL | Status: DC
Start: 2023-09-22 — End: 2023-09-23

## 2023-09-22 MED ORDER — METOPROLOL SUCCINATE ER 25 MG PO TB24
25 | Freq: Every evening | ORAL | Status: DC
Start: 2023-09-22 — End: 2023-09-23
  Administered 2023-09-23: 02:00:00 50 mg via ORAL

## 2023-09-22 MED ORDER — LABETALOL HCL 5 MG/ML IV SOLN
5 | INTRAVENOUS | Status: DC | PRN
Start: 2023-09-22 — End: 2023-09-22

## 2023-09-22 MED ORDER — NORMAL SALINE FLUSH 0.9 % IV SOLN
0.9 | Freq: Two times a day (BID) | INTRAVENOUS | Status: DC
Start: 2023-09-22 — End: 2023-09-23
  Administered 2023-09-23: 12:00:00 10 mL via INTRAVENOUS

## 2023-09-22 MED ORDER — ROCURONIUM BROMIDE 50 MG/5ML IV SOLN
50 | INTRAVENOUS | Status: AC
Start: 2023-09-22 — End: 2023-09-22

## 2023-09-22 MED ORDER — CLOPIDOGREL BISULFATE 300 MG PO TABS
300 | Freq: Once | ORAL | Status: AC
Start: 2023-09-22 — End: 2023-09-22
  Administered 2023-09-22: 19:00:00 600 mg via ORAL

## 2023-09-22 MED ORDER — PANTOPRAZOLE SODIUM 40 MG PO TBEC
40 | Freq: Two times a day (BID) | ORAL | Status: DC
Start: 2023-09-22 — End: 2023-09-23
  Administered 2023-09-22 – 2023-09-23 (×2): 40 mg via ORAL

## 2023-09-22 MED FILL — CLOPIDOGREL BISULFATE 300 MG PO TABS: 300 mg | ORAL | Qty: 2 | Fill #0

## 2023-09-22 MED FILL — LIDOCAINE HCL 2 % IJ SOLN: 2 % | INTRAMUSCULAR | Qty: 20 | Fill #0

## 2023-09-22 MED FILL — PROTAMINE SULFATE 10 MG/ML IV SOLN: 10 mg/mL | INTRAVENOUS | Qty: 25 | Fill #0

## 2023-09-22 MED FILL — CETIRIZINE HCL 10 MG PO TABS: 10 mg | ORAL | Qty: 1 | Fill #0

## 2023-09-22 MED FILL — ROCURONIUM BROMIDE 50 MG/5ML IV SOLN: 50 MG/5ML | INTRAVENOUS | Qty: 5 | Fill #0

## 2023-09-22 MED FILL — ONDANSETRON HCL 4 MG/2ML IJ SOLN: 4 MG/2ML | INTRAMUSCULAR | Qty: 2 | Fill #0

## 2023-09-22 MED FILL — BRIDION 200 MG/2ML IV SOLN: 200 MG/2ML | INTRAVENOUS | Qty: 2 | Fill #0

## 2023-09-22 MED FILL — ASPIRIN LOW DOSE 81 MG PO CHEW: 81 mg | ORAL | Qty: 1 | Fill #0

## 2023-09-22 MED FILL — TAMSULOSIN HCL 0.4 MG PO CAPS: 0.4 mg | ORAL | Qty: 1 | Fill #0

## 2023-09-22 MED FILL — BD POSIFLUSH 0.9 % IV SOLN: 0.9 % | INTRAVENOUS | Qty: 40 | Fill #0

## 2023-09-22 MED FILL — LIPITOR 40 MG PO TABS: 40 mg | ORAL | Qty: 1 | Fill #0

## 2023-09-22 MED FILL — HEPARIN (PORCINE) IN NACL 1000-0.9 UT/500ML-% IV SOLN: 1000-0.9 UT/500ML-% | INTRAVENOUS | Qty: 2000 | Fill #0

## 2023-09-22 MED FILL — HEPARIN SODIUM (PORCINE) 1000 UNIT/ML IJ SOLN: 1000 [IU]/mL | INTRAMUSCULAR | Qty: 10 | Fill #0

## 2023-09-22 MED FILL — XYLOCAINE-MPF 2 % IJ SOLN: 2 % | INTRAMUSCULAR | Qty: 5 | Fill #0

## 2023-09-22 MED FILL — PANTOPRAZOLE SODIUM 40 MG PO TBEC: 40 mg | ORAL | Qty: 1 | Fill #0

## 2023-09-22 MED FILL — DEXAMETHASONE SODIUM PHOSPHATE 4 MG/ML IJ SOLN: 4 mg/mL | INTRAMUSCULAR | Qty: 1 | Fill #0

## 2023-09-22 MED FILL — DIPRIVAN 200 MG/20ML IV EMUL: 200 MG/20ML | INTRAVENOUS | Qty: 20 | Fill #0

## 2023-09-22 MED FILL — ISOVUE-370 76 % IV SOLN: 76 % | INTRAVENOUS | Qty: 100 | Fill #0

## 2023-09-22 MED FILL — LABETALOL HCL 5 MG/ML IV SOLN: 5 mg/mL | INTRAVENOUS | Qty: 4 | Fill #0

## 2023-09-22 MED FILL — CEFAZOLIN SODIUM 1 G IJ SOLR: 1 g | INTRAMUSCULAR | Qty: 1000 | Fill #0

## 2023-09-22 NOTE — Progress Notes (Signed)
 1449: Patient arrives to PACU, report received from cath lab RN. Monitors applied. Groin site assessed, dressing bubbled with bleeding present  1455: cath lab notified of oozing. Pressure being held to groin site.  1503: plavix  and aspirin  given as ordered.   1508: BG 107  1515: Dr. Selestine at bedside assessing groin site. Per dr. Rizvi continue to hold pressure for 10 minutes and re-dress.  1535: pressure relieved, site assessed, oozing appears to have stopped. Re-dressed with gauze and tegaderm.   1540: daughter Landry updated on patients condition and location, patient tolerating ice water.   1548: Echo being completed at bedside  1555: report called to John RN  1600: VSS  1604: patient taken to 2E, groin site assessed with Norleen RN, dressing clean, dry, intact, tender on palpation.

## 2023-09-22 NOTE — Progress Notes (Signed)
 4 Eyes Skin Assessment     NAME:  Dalton Warner  DATE OF BIRTH:  [birth year]  MEDICAL RECORD NUMBER:  [id number]    The patient is being assessed for  Admission    I agree that at least one RN has performed a thorough Head to Toe Skin Assessment on the patient. ALL assessment sites listed below have been assessed.      Areas assessed by both nurses:    Head, Face, Ears, Shoulders, Back, Chest, Arms, Elbows, Hands, Sacrum. Buttock, Coccyx, Ischium, and Legs. Feet and Heels        Does the Patient have a Wound? No noted wound(s)       Braden Prevention initiated by RN: No  Wound Care Orders initiated by RN: No    For hospital-acquired stage 1 & 2 and ALL Stage 3,4, Unstageable, DTI, NWPT, and Complex wounds: place order "IP Wound Care/Ostomy Nurse Eval and Treat" by RN under ORDER ENTRY: NA    New Ostomies, if present place, Ostomy referral order under ORDER ENTRY: No     Nurse 1 eSignature: Electronically signed by Dorn Alm Slain, RN on 09/22/23 at 6:50 PM EDT    **SHARE this note so that the co-signing nurse can place an eSignature**    Nurse 2 eSignature: Electronically signed by Pleasant Press, RN on 09/23/23 at 10:15 AM EDT

## 2023-09-22 NOTE — Other (Signed)
 Informed Consent for Blood Component Transfusion Note    I have discussed with the patient the rationale for blood component transfusion; its benefits in treating or preventing fatigue, organ damage, or death; and its risk which includes mild transfusion reactions, rare risk of blood borne infection, or more serious but rare reactions. I have discussed the alternatives to transfusion, including the risk and consequences of not receiving transfusion. The patient had an opportunity to ask questions and had agreed to proceed with transfusion of blood components.    Electronically signed by Paulita Cosette Noble, MD on 09/22/23 at 12:58 PM EDT

## 2023-09-22 NOTE — Plan of Care (Signed)
 Updated patient in regards to his procedure running behind. Patient and family understanding. [address] [place], [place] 09/22/2023

## 2023-09-22 NOTE — Anesthesia Pre-Procedure Evaluation (Signed)
 Department of Anesthesiology  Preprocedure Note       Name:  Dalton Warner   Age:  89 y.o.  DOB:  10-03-1930                                          MRN:  [id number]         Date:  09/22/2023      Surgeon: Clotilde):  Cosette Selestine Rank, MD    Procedure: Procedure(s):  Watchman laa closure device    Medications prior to admission:   Prior to Admission medications   Medication Sig Start Date End Date Taking? Authorizing Provider   apixaban  (ELIQUIS ) 2.5 MG TABS tablet Take 1 tablet by mouth 2 times daily 01/13/23  Yes Bertell Clarine Nurse, MD   atorvastatin  (LIPITOR ) 40 MG tablet Take 1 tablet by mouth daily 01/13/23  Yes Bertell Clarine Nurse, MD   metoprolol  succinate (TOPROL  XL) 50 MG extended release tablet Take 1 tablet by mouth nightly 01/13/23  Yes Bertell Clarine Nurse, MD   cetirizine  (ZYRTEC ) 10 MG tablet Take 1 tablet by mouth daily   Yes [provider]   pantoprazole  (PROTONIX ) 40 MG tablet Take 1 tablet by mouth 2 times daily (before meals) 03/05/19  Yes Eziokwu, Akaolisa, MD   guaiFENesin  (MUCINEX ) 600 MG extended release tablet Take 2 tablets by mouth 2 times daily   Yes [provider]   alfuzosin (UROXATRAL) 10 MG extended release tablet Take 1 tablet by mouth daily   Yes [provider]   albuterol  sulfate (PROAIR  RESPICLICK) 108 (90 Base) MCG/ACT aerosol powder inhalation 2 puffs every 6 hours as needed   Yes [provider]   montelukast  (SINGULAIR ) 10 MG tablet 1 tablet nightly 01/09/18  Yes [provider]   fluorouracil (EFUDEX) 5 % cream Apply TOPICALLY TO skin cancer on left arm TWICE DAILY FOR 6 weeks. Wash hands after application. 07/28/23   [provider]   SYSTANE 0.4-0.3 % ophthalmic solution  06/08/22   [provider]   ketoconazole (NIZORAL) 2 % shampoo  01/07/20   [provider]   ketoconazole (NIZORAL) 2 % cream  01/07/20   [provider]   ammonium lactate (AMLACTIN) 12 % cream  08/27/19   [provider]        Current medications:    Current Facility-Administered Medications   Medication Dose Route Frequency Provider Last Rate Last Admin    0.9 % sodium chloride  infusion   IntraVENous PRN Tariq Rizvi, Sayed, MD           Allergies:    Allergies   Allergen Reactions    Cat Dander Other (See Comments)     Runny nose    Dust Mite Extract Other (See Comments)     Runny nose    Other      Other reaction(s): Other (See Comments)  Dust/cats - sneezing    Fish Allergy Nausea And Vomiting     Salmon only  Salmon only       Problem List:    Patient Active Problem List   Diagnosis Code    Thrombocytopenia D69.6    History of arthroplasty of right shoulder Z96.611    Severe anemia D64.9    Anemia D64.9    Essential hypertension I10    Atrial fibrillation (HCC) I48.91    Monoclonal gammopathy D47.2  SSS (sick sinus syndrome) (HCC) I49.5    Dyslipidemia E78.5    Mural thrombus of left ventricle I51.3    Pacemaker Z95.0    Paroxysmal atrial fibrillation (HCC) I48.0       Past Medical History:        Diagnosis Date    Arthritis     Atrial fibrillation (HCC)     Bleeding ulcer 08/2018    Cancer [facility])     Prostate cancer dx March 2010- tx with radiation     Colon cancer [facility]) 2000    per old chart dx with colon ca - tx with surgery only    Deficient knowledge of transesophageal echocardiography (TEE) 08/15/2023    Diabetes mellitus (HCC)     was borderline- at one time was on Metformin for several yrs-  when I went to [facility] they had me stop this in 2017    DVT (deep venous thrombosis) (HCC) 2015    States DVT 2014 and 2015    Fracture     had broken neck in Nov 2016- wore a collar for 6 weeks only    HOH (hard of hearing)     bil hearing aide    Hx of blood clots     had blood clot - it was a  blood clot in  my esophagus     Hx of Doppler ultrasound 09/19/2019    EF IS 55-60%  Bi atrial enlargement noted.  Moderate mitral, aortic, tricuspid and mild pulmonic regurgitation is present    Hx of fall     last fall  2016- use cane walker and have electric chair    Hyperlipidemia     Hypertension     follow with Dr Orvel at [facility]    Limited range of motion (ROM) of shoulder     right shoulder haved 10 % usage and left shoulder 60 % of usage    Neuropathy     both feet    Sleep apnea     had sleep study- tried to give me cpap but could not tolerate it     TIA (transient ischemic attack)     'in Jan 2009- trouble with speech, numbness of lip, weakness right arm - all only lasted for 20 seconds    Unsteady gait     hx of back problem- was on steroids for 4 yrs- since then stability issues with my back -     Wears glasses     to read        Past Surgical History:        Procedure Laterality Date    ABDOMEN SURGERY  2000    per old chart had right hemicolectomy removed 2 and a half feet of colon and my appendix    COLONOSCOPY  2019    CT BIOPSY BONE MARROW  11/02/2016    CT BONE MARROW BIOPSY 11/02/2016    ENDOSCOPY, COLON, DIAGNOSTIC      per old chart had EGD done 08/2018 and 09/2018    HERNIA REPAIR      per old chart open left ing hernia 1980 and then recurrent left ing hernia repair 08/2018    JOINT REPLACEMENT      per old chart total left knee 2018    PACEMAKER PLACEMENT      per old chart hx of st jude pacer insertion 2008- new pacer inserted 2019    SHOULDER SURGERY Left 2015    rota. cuff  SHOULDER SURGERY Right 02/27/2019    RIGHT REVERSE SHOULDER TOTAL ARTHROPLASTY REVERSE performed by Chauncey Gladis Hummer, MD at [facility] OR       Social History:    Social History     Tobacco Use    Smoking status: Never    Smokeless tobacco: Never   Substance Use Topics    Alcohol use: Yes     Comment: maybe 6 times per year when had prostate cancer did drink one glass red wine daily in the past  1cup coffee                                Counseling given: Not Answered      Vital Signs (Current):   Vitals:    09/22/23 0946   BP: 126/79   Pulse: 68   Resp: 18   Temp: 97 F (36.1 C)   TempSrc: Temporal   SpO2: 98%   Weight: 77.1  kg (170 lb)   Height: 1.702 m (5' 7)                                              BP Readings from Last 3 Encounters:   09/22/23 126/79   08/15/23 138/71   08/08/23 121/74       NPO Status:                                                                                 BMI:   Wt Readings from Last 3 Encounters:   09/22/23 77.1 kg (170 lb)   08/15/23 77.1 kg (170 lb)   08/05/23 77.1 kg (170 lb)     Body mass index is 26.63 kg/m.    CBC:   Lab Results   Component Value Date/Time    WBC 9.2 09/19/2023 02:55 PM    RBC 3.94 09/19/2023 02:55 PM    HGB 10.8 09/19/2023 02:55 PM    HCT 33.5 09/19/2023 02:55 PM    MCV 85.0 09/19/2023 02:55 PM    RDW 20.9 09/19/2023 02:55 PM    PLT 86 07/29/2022 07:20 AM       CMP:   Lab Results   Component Value Date/Time    NA 134 09/22/2023 09:50 AM    K 4.7 09/19/2023 02:55 PM    CL 95 09/19/2023 02:55 PM    CO2 22 09/19/2023 02:55 PM    BUN 17 09/19/2023 02:55 PM    CREATININE 1.0 09/19/2023 02:55 PM    GFRAA >60 09/29/2020 08:33 AM    GFRAA >60 09/29/2020 08:33 AM    LABGLOM 71 09/19/2023 02:55 PM    LABGLOM 58 04/19/2022 09:03 AM    GLUCOSE 103 09/19/2023 02:55 PM    CALCIUM  9.1 09/19/2023 02:55 PM    BILITOT 0.7 05/06/2023 04:51 AM    ALKPHOS 71 05/06/2023 04:51 AM    AST 20 05/06/2023 04:51 AM    ALT 15 05/06/2023 04:51 AM       POC Tests: No results for input(s):  POCGLU, POCNA, POCK, POCCL, POCBUN, POCHEMO, POCHCT in the last 72 hours.    Coags:   Lab Results   Component Value Date/Time    PROTIME 15.6 03/26/2021 01:22 PM    INR 1.21 03/26/2021 01:22 PM    APTT 46.4 03/03/2019 12:55 AM       HCG (If Applicable): No results found for: PREGTESTUR, PREGSERUM, HCG, HCGQUANT     ABGs: No results found for: PHART, PO2ART, PCO2ART, HCO3ART, BEART, O2SATART     Type & Screen (If Applicable):  Lab Results   Component Value Date    ABORH A POSITIVE 09/22/2023    LABANTI NEGATIVE 09/22/2023       Drug/Infectious Status (If Applicable):  No results found for:  HIV, HEPCAB    COVID-19 Screening (If Applicable):   Lab Results   Component Value Date/Time    COVID19 NOT DETECTED 03/05/2019 10:15 AM    COVID19 NOT DETECTED 02/20/2019 12:33 PM           Anesthesia Evaluation    Airway: Mallampati: II  TM distance: >3 FB   Neck ROM: full  Mouth opening: > = 3 FB   Dental: normal exam         Pulmonary:normal exam    (+)     sleep apnea: on CPAP,                                  Cardiovascular:  Exercise tolerance: good (>4 METS)  (+) hypertension: moderate, pacemaker: pacemaker            Echocardiogram reviewed         Beta Blocker:  Dose within 24 Hrs         Neuro/Psych:   (+) TIA            GI/Hepatic/Renal: Neg GI/Hepatic/Renal ROS            Endo/Other:                     Abdominal:             Vascular:          Other Findings:             Anesthesia Plan      general     ASA 3       Induction: intravenous.      Anesthetic plan and risks discussed with patient.    Use of blood products discussed with patient whom consented to blood products.    Plan discussed with CRNA.    Attending anesthesiologist reviewed and agrees with Preprocedure content                Libby Everts, MD   09/22/2023

## 2023-09-22 NOTE — Progress Notes (Signed)
To room 3 assessment complete per flowsheet

## 2023-09-22 NOTE — H&P (Signed)
 Dalton Warner, 89 y.o., male    Primary care physician:  Verdie Delon [name], APRN - CNP     Chief Complaint:  afib with falls and bleeding needs watchman    he has been having recurrent falls and is at risk of bleeding decided to pursue Watchman procedure he had  TEE with Dr. Bertell  CHA2DS2-VASc Score for Atrial Fibrillation Stroke Risk    Risk   Factors   Component Value   C CHF No 0   H HTN Yes 1   A2 Age >= 75 Yes,  (89 y.o.) 2   D DM No 0   S2 Prior Stroke/TIA No 0   V Vascular Disease No 0   A Age 51-74 No,  (89 y.o.) 0   Sc Sex male 0     CHA2DS2-VASc  Score   3   Score last updated 08/02/23 3:12 PM EDT     Click here for a link to the UpToDate guideline Atrial Fibrillation: Anticoagulation therapy to prevent embolization     Disclaimer: Risk Score calculation is dependent on accuracy of patient problem list and past encounter diagnosis.                 HAS-BLED Score                          Risk Factors      Points   Hypertension  Uncontrolled, >160 mmHg systolic    Yes=1   Renal disease  Dialysis, transplant, Cr>2.26 mg/dL or >799 mol/L    No=0   Liver disease   Cirrhosis or bilirubin >2x normal with AST/ALT/AP >3x normal    No=0   Stroke history    No=0   Prior major bleeding or predisposition to bleeding     1   Labile  INR  Unstable/high INRs, time in therapeutic range <60%    No=0   Age >29    Yes=1   Medication usage predisposing to bleeding   Aspirin , clopidogrel , NSAIDs    Yes=1   Alcohol use   >7 drinks/week    No=0      HAS-BLED Score:    3:  Risk was 5.8% in one validation study and 3.72 bleeds per 100 patient-years in another validation study.         Atrial fibrillation:   paroxysmal  Is atrial fibrillation Valvular No     Atrial flutter:               no     LAA Occlusion Indication (select all that apply)              High fall risk     NYHA class  III     History of Present Illness:    Past medical history:    has a past medical history of Arthritis, Atrial fibrillation (HCC), Bleeding  ulcer, Cancer (HCC), Colon cancer (HCC), Deficient knowledge of transesophageal echocardiography (TEE), Diabetes mellitus (HCC), Fracture, HOH (hard of hearing), Hx of blood clots, Hx of Doppler ultrasound, Hx of fall, Hyperlipidemia, Hypertension, Limited range of motion (ROM) of shoulder, Neuropathy, Sleep apnea, TIA (transient ischemic attack), Unsteady gait, and Wears glasses.  Past surgical history:   has a past surgical history that includes shoulder surgery (Left, 2015); Colonoscopy (2019); pacemaker placement; hernia repair; Endoscopy, colon, diagnostic; Abdomen surgery (2000); joint replacement; shoulder surgery (Right, 02/27/2019); and CT BIOPSY BONE MARROW (11/02/2016).  Social History:  reports that he has never smoked. He has never used smokeless tobacco. He reports current alcohol use. He reports that he does not use drugs.  Family history:  family history includes Cancer in his brother and mother; Diabetes in his mother and sister; High Blood Pressure in his brother.    Allergies:      Allergies   Allergen Reactions    Cat Dander Other (See Comments)     Runny nose    Dust Mite Extract Other (See Comments)     Runny nose    Other      Other reaction(s): Other (See Comments)  Dust/cats - sneezing    Fish Allergy Nausea And Vomiting     Salmon only  Salmon only       Home Medications:  Prior to Admission medications   Medication Sig Start Date End Date Taking? Authorizing Provider   fluorouracil (EFUDEX) 5 % cream Apply TOPICALLY TO skin cancer on left arm TWICE DAILY FOR 6 weeks. Wash hands after application. 07/28/23   [provider]   apixaban  (ELIQUIS ) 2.5 MG TABS tablet Take 1 tablet by mouth 2 times daily 01/13/23   Bertell Clarine Nurse, MD   atorvastatin  (LIPITOR ) 40 MG tablet Take 1 tablet by mouth daily 01/13/23   Bertell Clarine Nurse, MD   metoprolol  succinate (TOPROL  XL) 50 MG extended release tablet Take 1 tablet by mouth nightly 01/13/23   Alam, Mian Bilal, MD   SYSTANE 0.4-0.3 % ophthalmic  solution  06/08/22   [provider]   ketoconazole (NIZORAL) 2 % shampoo  01/07/20   [provider]   ketoconazole (NIZORAL) 2 % cream  01/07/20   [provider]   ammonium lactate (AMLACTIN) 12 % cream  08/27/19   [provider]   cetirizine  (ZYRTEC ) 10 MG tablet Take 1 tablet by mouth daily    [provider]   pantoprazole  (PROTONIX ) 40 MG tablet Take 1 tablet by mouth 2 times daily (before meals) 03/05/19   Eziokwu, Akaolisa, MD   guaiFENesin  (MUCINEX ) 600 MG extended release tablet Take 2 tablets by mouth 2 times daily    [provider]   alfuzosin (UROXATRAL) 10 MG extended release tablet Take 1 tablet by mouth daily    [provider]   albuterol  sulfate (PROAIR  RESPICLICK) 108 (90 Base) MCG/ACT aerosol powder inhalation 2 puffs every 6 hours as needed    [provider]   montelukast  (SINGULAIR ) 10 MG tablet 1 tablet nightly 01/09/18   [provider]       Review of systems: @MYROS @    Physical Examination:    There were no vitals taken for this visit.     General Appearance:  No distress, conversant  Constitutional:  Well developed, Well nourished, No acute distress, Non-toxic appearance.   HENT:  Normocephalic, Atraumatic, Bilateral external ears normal, Oropharynx moist, No oral exudates, Nose normal. Neck- Normal range of motion, No tenderness, Supple, No stridor,no apical-carotid delay  Eyes:  PERRL, EOMI, Conjunctiva normal, No discharge.   Lymphatics: no palpable lymph nodes  Respiratory:  Normal breath sounds, No respiratory distress, No wheezing, No chest tenderness.,no uise of accessory muscles, JVP not elevated  Cardiovascular: (PMI) apex non displaced,no lifts no thrills, no s3,no s4, Normal heart rate, Normal rhythm, No murmurs, No rubs, No gallops.   GI:  Bowel sounds normal, Soft, No tenderness, No masses, No pulsatile masses, no hepatosplenomegally, no bruits  Musculoskeletal:  Intact distal pulses, No edema,  No tenderness, No cyanosis, No clubbing. Good range of motion in all major joints. No tenderness to palpation or major deformities noted. Back- No tenderness.   Integument:  Warm, Dry, No erythema, No rash.   Skin: no rash, no ulcers  Lymphatic:  No lymphadenopathy noted.   Neurologic:  Alert & oriented x 3, Normal motor function, Normal sensory function, No focal deficits noted.         Lab Review   Recent Labs     09/19/23  1455   WBC 9.2   HGB 10.8*   HCT 33.5*      Recent Labs     09/19/23  1455   NA 129*   K 4.7   CL 95*   CO2 22   PHOS 3.5   BUN 17   CREATININE 1.0     No results for input(s): AST, ALT, BILIDIR, BILITOT, ALKPHOS in the last 72 hours.    Invalid input(s): ALB  No results for input(s): TROPONINI in the last 72 hours.  Lab Results   Component Value Date    INR 1.21 03/26/2021    PROTIME 15.6 (H) 03/26/2021     No results found for: BNP      Assessment:    Principal Problem:    Paroxysmal atrial fibrillation (HCC)  Plan:      ASA : 2 , Mallampati class II  Plan:   1. : Alternate and risks versus benefits were discussed in detail.  We will plan watchman.  We  discussed the risk of but not limited to  potential kidney failure, emergent surgery,blood transfusion  transfusion, infection, potential even death.  Patient is in agreement and wants to proceed

## 2023-09-22 NOTE — Progress Notes (Signed)
Patient to lab for procedure

## 2023-09-23 MED ORDER — ASPIRIN 81 MG PO CHEW
81 | ORAL_TABLET | Freq: Every day | ORAL | 3 refills | 30.00000 days | Status: DC
Start: 2023-09-23 — End: 2023-09-23

## 2023-09-23 MED ORDER — CLOPIDOGREL BISULFATE 75 MG PO TABS
75 | ORAL_TABLET | Freq: Every day | ORAL | 0 refills | 30.00000 days | Status: DC
Start: 2023-09-23 — End: 2023-10-18

## 2023-09-23 MED ORDER — CLOPIDOGREL BISULFATE 75 MG PO TABS
75 | ORAL_TABLET | Freq: Every day | ORAL | 3 refills | 30.00000 days | Status: DC
Start: 2023-09-23 — End: 2023-09-23

## 2023-09-23 MED ORDER — CLOPIDOGREL BISULFATE 75 MG PO TABS
75 | ORAL_TABLET | Freq: Every day | ORAL | 1 refills | 30.00000 days | Status: DC
Start: 2023-09-23 — End: 2023-10-05
  Filled 2023-09-23: qty 30, 30d supply, fill #0

## 2023-09-23 MED ORDER — ASPIRIN 81 MG PO CHEW
81 | ORAL_TABLET | Freq: Every day | ORAL | 0 refills | 30.00000 days | Status: DC
Start: 2023-09-23 — End: 2023-10-05

## 2023-09-23 MED ORDER — ASPIRIN 81 MG PO TBEC
81 | ORAL_TABLET | Freq: Every day | ORAL | 1 refills | 30.00000 days | Status: AC
Start: 2023-09-23 — End: ?
  Filled 2023-09-23: qty 30, 30d supply, fill #0

## 2023-09-23 MED FILL — MONTELUKAST SODIUM 10 MG PO TABS: 10 mg | ORAL | Qty: 1 | Fill #0

## 2023-09-23 MED FILL — ENOXAPARIN SODIUM 40 MG/0.4ML IJ SOSY: 40 MG/0.4ML | INTRAMUSCULAR | Qty: 0.4 | Fill #0

## 2023-09-23 MED FILL — METOPROLOL SUCCINATE ER 25 MG PO TB24: 25 mg | ORAL | Qty: 2 | Fill #0

## 2023-09-23 MED FILL — MUCINEX 600 MG PO TB12: 600 mg | ORAL | Qty: 2 | Fill #0

## 2023-09-23 NOTE — Progress Notes (Signed)
 Physician Progress Note      PATIENT:               Dalton Warner, Dalton Warner  CSN #:                  [id number]  DOB:                       1930/12/04  ADMIT DATE:       09/22/2023 9:27 AM  DISCH DATE:        09/23/2023 10:46 AM  RESPONDING  PROVIDER #:        Paulita Cosette Noble MD          QUERY TEXT:    Based on your medical judgment, please clarify these findings and document if   any of the following are being evaluated and/or treated:    The clinical indicators include:  PAF, HTN, Age 13, Hx TIA    H&P: CHA2DS2-VASc Score for Atrial Fibrillation Stroke Risk-3, Principal   Problem:  Paroxysmal atrial fibrillation    Procedure notes: Indication of procedure: atrial fibrillation, CVA,    apixaban  (ELIQUIS ) 2.5 MG TABS tablet  Options provided:  -- Secondary hypercoagulable state in a patient with atrial fibrillation  -- Other - I will add my own diagnosis  -- Disagree - Not applicable / Not valid  -- Disagree - Clinically unable to determine / Unknown  -- Refer to Clinical Documentation Reviewer    PROVIDER RESPONSE TEXT:    This patient has secondary hypercoagulable state in a patient with atrial   fibrillation.    Query created by: Sonda Planas on 09/23/2023 8:27 AM      Electronically signed by:  Paulita Cosette Noble MD 09/28/2023 10:14 AM

## 2023-09-23 NOTE — Plan of Care (Signed)
 Problem: Safety - Adult  Goal: Free from fall injury  09/23/2023 0955 by Dyann Moritz, RN  Outcome: Adequate for Discharge  09/23/2023 0138 by Sherre Shaper, RN  Outcome: Progressing     Problem: Discharge Planning  Goal: Discharge to home or other facility with appropriate resources  09/23/2023 0955 by Dyann Moritz, RN  Outcome: Adequate for Discharge  Flowsheets (Taken 09/23/2023 0805 by Ryan Barrack, RN)  Discharge to home or other facility with appropriate resources: Identify barriers to discharge with patient and caregiver  09/23/2023 0138 by Sherre Shaper, RN  Outcome: Progressing     Problem: Pain  Goal: Verbalizes/displays adequate comfort level or baseline comfort level  09/23/2023 0955 by Dyann Moritz, RN  Outcome: Adequate for Discharge  09/23/2023 0138 by Sherre Shaper, RN  Outcome: Progressing     Problem: Chronic Conditions and Co-morbidities  Goal: Patient's chronic conditions and co-morbidity symptoms are monitored and maintained or improved  09/23/2023 0955 by Dyann Moritz, RN  Outcome: Adequate for Discharge  Flowsheets (Taken 09/23/2023 0805 by Ryan Barrack, RN)  Care Plan - Patient's Chronic Conditions and Co-Morbidity Symptoms are Monitored and Maintained or Improved: Monitor and assess patient's chronic conditions and comorbid symptoms for stability, deterioration, or improvement  09/23/2023 0138 by Sherre Shaper, RN  Outcome: Progressing     Problem: ABCDS Injury Assessment  Goal: Absence of physical injury  Outcome: Adequate for Discharge

## 2023-09-23 NOTE — Telephone Encounter (Signed)
 he had the watchman put in yesterday and he has a question about his card he got for that

## 2023-09-23 NOTE — Anesthesia Post-Procedure Evaluation (Signed)
 Department of Anesthesiology  Postprocedure Note    Patient: Dalton Warner  MRN: [id number]  Birthdate: July 22, 1930  Date of evaluation: 09/23/2023    Procedure Summary       Date: 09/22/23 Room / Location: [facility] CATH LAB 3 / [facility] CARDIAC CATH LAB    Anesthesia Start: 1256 Anesthesia Stop: 1457    Procedure: Watchman laa closure device Diagnosis:       Paroxysmal atrial fibrillation (HCC)      (Paroxysmal atrial fibrillation (HCC) [I48.0])    Providers: Cosette Selestine Rank, MD Responsible Provider: Jennine Gaines, MD    Anesthesia Type: general ASA Status: 3            Anesthesia Type: No value filed.    Aldrete Phase I: Aldrete Score: 10    Aldrete Phase II:      Anesthesia Post Evaluation    Patient location during evaluation: bedside  Patient participation: complete - patient participated  Level of consciousness: awake  Airway patency: patent  Nausea & Vomiting: no nausea  Cardiovascular status: blood pressure returned to baseline  Respiratory status: acceptable  Hydration status: euvolemic  Pain management: adequate      There were no known notable events for this encounter.

## 2023-09-23 NOTE — Plan of Care (Signed)
 Problem: Safety - Adult  Goal: Free from fall injury  09/23/2023 0138 by Sherre Shaper, RN  Outcome: Progressing  09/22/2023 1840 by Chandra Dorn Lenis, RN  Outcome: Progressing     Problem: Pain  Goal: Verbalizes/displays adequate comfort level or baseline comfort level  09/23/2023 0138 by Sherre Shaper, RN  Outcome: Progressing  09/22/2023 1840 by Chandra Dorn Lenis, RN  Outcome: Progressing

## 2023-09-23 NOTE — Discharge Summary (Signed)
 Patient ID:  Dalton Warner  [id number] y.o. 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 device [Z95.818]    Discharge Diagnoses:   Patient Active Problem List   Diagnosis    Thrombocytopenia    History of arthroplasty of right shoulder    Severe anemia    Anemia    Essential hypertension    Atrial fibrillation (HCC)    Monoclonal gammopathy    SSS (sick sinus syndrome) (HCC)    Dyslipidemia    Mural thrombus of left ventricle    Pacemaker    Paroxysmal atrial fibrillation (HCC)    Presence of Watchman left atrial appendage closure device        Discharged Condition: good    Hospital Course: Dalton Warner was admitted for Watchman device placement    Patient has had unremarkable recovery.  Asymptomatic this morning.    Eliquis  will be discontinued and patient will be maintained on dual antiplatelet therapy with aspirin  and Plavix  for 6 months.    Close outpatient follow-up        Consults: none    Procedures:  Watchman device placement with Dr. Noble 09/22/2023    Successful deployment of left atrial appendage occlusion device with no complication     WATCHMAN device used 24 mm size      Angiogram revealed good seal and no thrombus      TEE confirmed placement and seal     EBL Less than 20 mL  No complications        Significant Diagnostic Studies:     Echocardiogram 09/22/2023    Left Ventricle: Normal left ventricular systolic function with a visually estimated EF of 55 - 60%. Left ventricle size is normal. Normal wall thickness. Normal wall motion.    Aortic Valve: Aortic root is dilated measuring 4.1  cm. Mild to moderate regurgitation. Mild to moderate stenosis of the aortic valve. AV mean gradient is 10 mmHg. AV area by continuity VTI is 1.1 cm2.    Mitral Valve: There  is mild anterior and posterior annular calcification noted. Mild to moderate regurgitation.    Tricuspid Valve: Moderate to severe regurgitation.  RVSP is 55 mmHg.    Bilateral atrial enlargement.    Pericardium: No pericardial effusion.     Transesophageal echocardiogram 09/22/2023    Intra-Op TEE for Watchman device implantation.      Pre:  No evidence of thrombus within the left atrial appendage.  No ASD or PFO noted.  No evidence of any pericardial effusion.     Post: Successful implantation of a 24 mm Watchman device.  The device appears well seated with good compression.  No residual leak noted.  No evidence of any pericardial effusion.      Labs:   Lab Results   Component Value Date    CREATININE 1.0 09/19/2023    BUN 17 09/19/2023    NA 134 (L) 09/22/2023    K 4.7 09/19/2023    CL 95 (L) 09/19/2023    CO2 22 09/19/2023      Lab Results   Component Value Date    WBC 9.2 09/19/2023    HGB 10.8 (L) 09/19/2023    HCT 33.5 (L) 09/19/2023    MCV  85.0 09/19/2023    PLT 86 (L) 07/29/2022      Lab Results   Component Value Date    INR 1.21 03/26/2021    PROTIME 15.6 (H) 03/26/2021          Discharge Medications:     Medication List        START taking these medications      * aspirin  81 MG EC tablet  Take 1 tablet by mouth daily     * aspirin  81 MG chewable tablet  Take 1 tablet by mouth daily  Start taking on: September 24, 2023     * clopidogrel  75 MG tablet  Commonly known as: PLAVIX   Take 1 tablet by mouth daily     * clopidogrel  75 MG tablet  Commonly known as: PLAVIX   Take 1 tablet by mouth daily  Start taking on: September 24, 2023           * This list has 4 medication(s) that are the same as other medications prescribed for you. Read the directions carefully, and ask your doctor or other care provider to review them with you.                CONTINUE taking these medications      albuterol  sulfate 108 (90 Base) MCG/ACT aerosol powder inhalation  Commonly known as: PROAIR  RESPICLICK     alfuzosin 10 MG extended  release tablet  Commonly known as: UROXATRAL     ammonium lactate 12 % cream  Commonly known as: AMLACTIN     atorvastatin  40 MG tablet  Commonly known as: LIPITOR   Take 1 tablet by mouth daily     cetirizine  10 MG tablet  Commonly known as: ZYRTEC      fluorouracil 5 % cream  Commonly known as: EFUDEX     guaiFENesin  600 MG extended release tablet  Commonly known as: MUCINEX      * ketoconazole 2 % shampoo  Commonly known as: NIZORAL     * ketoconazole 2 % cream  Commonly known as: NIZORAL     metoprolol  succinate 50 MG extended release tablet  Commonly known as: TOPROL  XL  Take 1 tablet by mouth nightly     montelukast  10 MG tablet  Commonly known as: SINGULAIR      pantoprazole  40 MG tablet  Commonly known as: PROTONIX   Take 1 tablet by mouth 2 times daily (before meals)     Systane 0.4-0.3 % ophthalmic solution  Generic drug: polyethyl glycol-propyl glycol 0.4-0.3 %           * This list has 2 medication(s) that are the same as other medications prescribed for you. Read the directions carefully, and ask your doctor or other care provider to review them with you.                STOP taking these medications      apixaban  2.5 MG Tabs tablet  Commonly known as: Eliquis                Where to Get Your Medications        These medications were sent to [facility] DELIVERY - [place], [place] - [address] - [place] [phone] - F [phone]  [address] [place] [address]      Phone: [phone]   aspirin  81 MG EC tablet  clopidogrel  75 MG tablet       These medications were sent to [facility]  [facility] - [place], [facility] - [address] - [place] [phone] - F [phone]  [address], [place] [address]      Phone: [phone]   aspirin  81 MG chewable tablet  clopidogrel  75 MG tablet          Discharge Exam:  Physical Exam  Constitutional:       Appearance: He is not ill-appearing.   HENT:      Mouth/Throat:      Comments: Pupils equal and  round  Cardiovascular:      Rate and Rhythm: Normal rate and regular rhythm.      Comments: right  femoral vein access site dressing is clean, dry and intact, site is soft without hematoma, ecchymosis or erythema noted.    Pulmonary:      Effort: Pulmonary effort is normal.      Breath sounds: Normal breath sounds.   Abdominal:      Palpations: Abdomen is soft.   Musculoskeletal:      Right lower leg: No edema.      Left lower leg: No edema.   Skin:     General: Skin is warm.      Capillary Refill: Capillary refill takes less than 2 seconds.   Neurological:      Mental Status: He is alert and oriented to person, place, and time.          Disposition: home    Patient Instructions:      Medication List        START taking these medications      * aspirin  81 MG EC tablet  Take 1 tablet by mouth daily     * aspirin  81 MG chewable tablet  Take 1 tablet by mouth daily  Start taking on: September 24, 2023     * clopidogrel  75 MG tablet  Commonly known as: PLAVIX   Take 1 tablet by mouth daily     * clopidogrel  75 MG tablet  Commonly known as: PLAVIX   Take 1 tablet by mouth daily  Start taking on: September 24, 2023           * This list has 4 medication(s) that are the same as other medications prescribed for you. Read the directions carefully, and ask your doctor or other care provider to review them with you.                CONTINUE taking these medications      albuterol  sulfate 108 (90 Base) MCG/ACT aerosol powder inhalation  Commonly known as: PROAIR  RESPICLICK     alfuzosin 10 MG extended release tablet  Commonly known as: UROXATRAL     ammonium lactate 12 % cream  Commonly known as: AMLACTIN     atorvastatin  40 MG tablet  Commonly known as: LIPITOR   Take 1 tablet by mouth daily     cetirizine  10 MG tablet  Commonly known as: ZYRTEC      fluorouracil 5 % cream  Commonly known as: EFUDEX     guaiFENesin  600 MG extended release tablet  Commonly known as: MUCINEX      * ketoconazole 2 % shampoo  Commonly known as: NIZORAL     *  ketoconazole 2 % cream  Commonly known as: NIZORAL     metoprolol  succinate 50 MG extended release tablet  Commonly known as: TOPROL  XL  Take 1 tablet by mouth nightly     montelukast  10 MG tablet  Commonly known as: SINGULAIR   pantoprazole  40 MG tablet  Commonly known as: PROTONIX   Take 1 tablet by mouth 2 times daily (before meals)     Systane 0.4-0.3 % ophthalmic solution  Generic drug: polyethyl glycol-propyl glycol 0.4-0.3 %           * This list has 2 medication(s) that are the same as other medications prescribed for you. Read the directions carefully, and ask your doctor or other care provider to review them with you.                STOP taking these medications      apixaban  2.5 MG Tabs tablet  Commonly known as: Eliquis                Where to Get Your Medications        These medications were sent to [facility] DELIV[facility] - [place], [place] - [address] - [place] [phone] - F [phone]  [address] [place] [address]      Phone: [phone]   aspirin  81 MG EC tablet  clopidogrel  75 MG tablet       These medications were sent to [facility] - [place], [place] - [address] - [place] [phone] - F [phone]  [address], [place] [address]      Phone: [phone]   aspirin  81 MG chewable tablet  clopidogrel  75 MG tablet         Follow-up with Dr. Selestine in 1 week    Time spent on discharge >30 minutes    Lonni Barefoot PA-C 09/23/23 10:40 AM

## 2023-09-26 ENCOUNTER — Emergency Department: Admit: 2023-09-26 | Payer: MEDICARE | Primary: Family

## 2023-09-26 ENCOUNTER — Inpatient Hospital Stay
Admit: 2023-09-26 | Discharge: 2023-09-26 | Disposition: A | Discharge status: Home / Self Care | Payer: MEDICARE | Arrived: AM | Attending: Emergency Medicine

## 2023-09-26 DIAGNOSIS — Z4889 Encounter for other specified surgical aftercare: Principal | ICD-10-CM

## 2023-09-26 DIAGNOSIS — R1909 Other intra-abdominal and pelvic swelling, mass and lump: Principal | ICD-10-CM

## 2023-09-26 LAB — CBC WITH AUTO DIFFERENTIAL
Basophils %: 0 % (ref 0–1)
Basophils Absolute: 0.03 k/uL
Eosinophils %: 1 % (ref 0.0–3.0)
Eosinophils Absolute: 0.11 k/uL
Hematocrit: 30.1 % — ABNORMAL LOW (ref 42.0–52.0)
Hemoglobin: 10.1 g/dL — ABNORMAL LOW (ref 13.5–18.0)
Immature Granulocytes %: 1 % — ABNORMAL HIGH
Immature Granulocytes Absolute: 0.06 k/uL
Lymphocytes %: 19 % — ABNORMAL LOW (ref 24–44)
Lymphocytes Absolute: 1.83 k/uL
MCH: 27.4 pg (ref 27.0–31.0)
MCHC: 33.6 g/dL (ref 32.0–36.0)
MCV: 81.8 fL (ref 78.0–100.0)
Monocytes %: 14 % — ABNORMAL HIGH (ref 0–5)
Monocytes Absolute: 1.39 k/uL
Neutrophils %: 65 % (ref 36–66)
Neutrophils Absolute: 6.47 k/uL
Platelet, Fluorescence: 76 k/uL — ABNORMAL LOW (ref 140–440)
RBC: 3.68 m/uL — ABNORMAL LOW (ref 4.60–6.20)
RDW: 20.8 % — ABNORMAL HIGH (ref 11.7–14.9)
WBC: 9.9 k/uL (ref 4.0–10.5)

## 2023-09-26 LAB — BASIC METABOLIC PANEL
Anion Gap: 9 mmol/L (ref 9–17)
BUN: 18 mg/dL (ref 7–20)
CO2: 22 mmol/L (ref 21–32)
Calcium: 8.8 mg/dL (ref 8.3–10.6)
Chloride: 97 mmol/L — ABNORMAL LOW (ref 99–110)
Creatinine: 0.9 mg/dL (ref 0.8–1.3)
Est, Glom Filt Rate: 76 mL/min/1.73m2 (ref >60)
Glucose: 110 mg/dL — ABNORMAL HIGH (ref 74–99)
Potassium: 4.1 mmol/L (ref 3.5–5.1)
Sodium: 129 mmol/L — ABNORMAL LOW (ref 136–145)

## 2023-09-26 LAB — PLATELET CONFIRMATION: Platelet Confirmation: DECREASED

## 2023-09-26 LAB — APTT: APTT: 36.2 s (ref 25.1–37.1)

## 2023-09-26 LAB — PROTIME-INR
INR: 1.1
Protime: 15.2 s — ABNORMAL HIGH (ref 11.7–14.5)

## 2023-09-26 MED ORDER — IOPAMIDOL 76 % IV SOLN
76 | Freq: Once | INTRAVENOUS | Status: AC | PRN
Start: 2023-09-26 — End: 2023-09-26
  Administered 2023-09-26: 09:00:00 75 mL via INTRAVENOUS

## 2023-09-26 MED FILL — ISOVUE-370 76 % IV SOLN: 76 % | INTRAVENOUS | Qty: 100

## 2023-09-26 NOTE — ED Triage Notes (Signed)
 Pt arrived via medic with concerns about his post op site.

## 2023-09-26 NOTE — Discharge Instructions (Addendum)
 Continuity of Care Form    Patient Name: Dalton Warner   DOB:  [birth year]  MRN:  [id number]    Admit date:  09/26/2023  Discharge date:  ***    Code Status Order: Prior   Advance Directives:     Admitting Physician:  No admitting provider for patient encounter.  PCP: Verdie Delon [name], APRN - CNP    Discharging Nurse: [facility] Unit/Room#: ED26/ED-26  Discharging Unit Phone Number: ***    Emergency Contact:   Extended Emergency Contact Information  Primary Emergency Contact: Vawter,GAIL  Home Phone: [phone]  Relation: Child  Interpreter needed? No  Secondary Emergency ContactBETHA Rogerio Particia [place]  Home Phone: [phone]  Mobile Phone: [phone]  Relation: Other    Past Surgical History:  Past Surgical History:   Procedure Laterality Date    ABDOMEN SURGERY  2000    per old chart had right hemicolectomy removed 2 and a half feet of colon and my appendix    COLONOSCOPY  2019    CT BIOPSY BONE MARROW  11/02/2016    CT BONE MARROW BIOPSY 11/02/2016    ENDOSCOPY, COLON, DIAGNOSTIC      per old chart had EGD done 08/2018 and 09/2018    EP DEVICE PROCEDURE N/A 09/22/2023    Watchman laa closure device performed by Cosette Selestine Rank, MD at [facility] CARDIAC CATH LAB    HERNIA REPAIR      per old chart open left ing hernia 1980 and then recurrent left ing hernia repair 08/2018    JOINT REPLACEMENT      per old chart total left knee 2018    PACEMAKER PLACEMENT      per old chart hx of st jude pacer insertion 2008- new pacer inserted 2019    SHOULDER SURGERY Left 2015    rota. cuff    SHOULDER SURGERY Right 02/27/2019    RIGHT REVERSE SHOULDER TOTAL ARTHROPLASTY REVERSE performed by Chauncey Gladis Hummer, MD at [facility] OR       Immunization History:   Immunization History   Administered Date(s) Administered    COVID-19, MODERNA BLUE border, Primary or Immunocompromised, (age 12y+), IM, 100 mcg/0.72mL 02/19/2019, 04/03/2019, 12/18/2019, 05/22/2020    COVID-19, PFIZER, 2024/25, (age 12y+), IM, 79mcg/0.3mL 06/24/2023     Influenza A (H1N1-09) Vaccine PF IM 01/26/2008    Influenza Virus Vaccine 11/09/2010, 11/09/2011, 11/22/2012, 10/09/2013, 10/27/2014, 02/09/2015, 10/12/2015, 10/16/2015, 10/18/2015, 02/08/2017, 10/24/2018, 10/07/2020    Influenza, FLUAD, (age 49 y+), IM, Quadv, 0.6mL 10/25/2018, 11/05/2019, 10/06/2021    Influenza, FLUAD, (age 78 y+), IM, Trivalent PF, 0.5mL 10/31/2017    Influenza, FLUZONE High Dose, (age 64 y+), IM, Trivalent PF, 0.25mL 10/17/2015, 10/20/2016, 11/07/2016    Pneumococcal Vaccine 11/08/1997, 11/09/2010    Pneumococcal, PCV-13, PREVNAR 13, (age 6w+), IM, 0.21mL 11/09/2010, 07/17/2015    Pneumococcal, PPSV23, PNEUMOVAX 23, (age 2y+), SC/IM, 0.31mL 10/28/2013, 12/04/2014, 01/12/2017    TDaP, ADACEL (age 72y-64y), BOOSTRIX (age 10y+), IM, 0.101mL 01/09/2008, 02/09/2012, 12/14/2012, 04/13/2018    Td vaccine (adult) 02/08/1993, 12/21/2006, 12/14/2012    Tetanus 12/21/2006    Zoster Live (Zostavax) 12/09/2005    Zoster Recombinant (Shingrix) 07/06/2017, 09/08/2017       Active Problems:  Patient Active Problem List   Diagnosis Code    Thrombocytopenia D69.6    History of arthroplasty of right shoulder Z96.611    Severe anemia D64.9    Anemia D64.9    Essential hypertension I10    Atrial fibrillation (HCC) I48.91    Monoclonal  gammopathy D47.2    SSS (sick sinus syndrome) (HCC) I49.5    Dyslipidemia E78.5    Mural thrombus of left ventricle I51.3    Pacemaker Z95.0    Paroxysmal atrial fibrillation (HCC) I48.0    Presence of Watchman left atrial appendage closure device Z95.818       Isolation/Infection:   Isolation            No Isolation          Patient Infection Status    None to display              Nurse Assessment:  Last Vital Signs: BP (!) 151/79   Pulse 64   Temp 98.5 F (36.9 C) (Oral)   Resp 18   Ht 1.702 m (5' 7)   Wt 76.7 kg (169 lb)   SpO2 99%   BMI 26.47 kg/m     Last documented pain score (0-10 scale): Pain Level: 0  Last Weight:   Wt Readings from Last 1 Encounters:   09/26/23 76.7 kg  (169 lb)     Mental Status:  {IP PT MENTAL STATUS:20030}    IV Access:  {MH COC IV ACCESS:304088262}    Nursing Mobility/ADLs:  Walking   {CHP DME JIOd:695911956}  Transfer  {CHP DME JIOd:695911956}  Bathing  {CHP DME JIOd:695911956}  Dressing  {CHP DME JIOd:695911956}  Toileting  {CHP DME JIOd:695911956}  Feeding  {CHP DME JIOd:695911956}  Med Admin  {CHP DME JIOd:695911956}  Med Delivery   {MH COC MED Delivery:304088264}    Wound Care Documentation and Therapy:  Puncture 09/22/23 Femoral (Active)   Number of days: 3        Elimination:  Continence:   Bowel: {YES / WN:80272}  Bladder: {YES / WN:80272}  Urinary Catheter: {Urinary Catheter:304088013}   Colostomy/Ileostomy/Ileal Conduit: {YES / WN:80272}       Date of Last BM: ***  No intake or output data in the 24 hours ending 09/26/23 0556  No intake/output data recorded.    Safety Concerns:     {MH COC Safety Concerns:304088272}    Impairments/Disabilities:      {MH COC Impairments/Disabilities:304088273}    Nutrition Therapy:  Current Nutrition Therapy:   {MH COC Diet List:304088271}    Routes of Feeding: {CHP DME Other Feedings:304088042}  Liquids: {Slp liquid thickness:30034}  Daily Fluid Restriction: {CHP DME Yes amt example:304088041}  Last Modified Barium Swallow with Video (Video Swallowing Test): {Done Not Done Ijuz:695911987}    Treatments at the Time of Hospital Discharge:   Respiratory Treatments: ***  Oxygen Therapy:  {Therapy; copd oxygen:17808}  Ventilator:    {MH CC Vent Opdu:695911888}    Rehab Therapies: {THERAPEUTIC INTERVENTION:778-829-9031}  Weight Bearing Status/Restrictions: {MH CC Weight Bearing:304508812}  Other Medical Equipment (for information only, NOT a DME order):  {EQUIPMENT:304520077}  Other Treatments: ***    Patient's personal belongings (please select all that are sent with patient):  {CHP DME Belongings:304088044}    RN SIGNATURE:  {Esignature:304088025}    CASE MANAGEMENT/SOCIAL WORK SECTION    Inpatient Status Date:  ***    Readmission Risk Assessment Score:  [facility] RISK OF UNPLANNED READMISSION 2.0             0 Total Score        Discharging to Facility/ Agency   Name:   Address:  Phone:  Fax:    Dialysis Facility (if applicable)   Name:  Address:  Dialysis Schedule:  Phone:  Fax:    Case Manager/Social Worker  signature: {Esignature:304088025}    PHYSICIAN SECTION    Prognosis: {Prognosis:586-085-4385}    Condition at Discharge: {MH Patient Condition:304088024}    Rehab Potential (if transferring to Rehab): {Prognosis:586-085-4385}    Recommended Labs or Other Treatments After Discharge: ***    Physician Certification: I certify the above information and transfer of Dalton Warner  is necessary for the continuing treatment of the diagnosis listed and that he requires {Admit to Appropriate Level of Care:20763} for {GREATER/LESS:304500278} 30 days.     Update Admission H&P: {CHP DME Changes in YjwiE:695911954}    PHYSICIAN SIGNATURE:  {Esignature:304088025}

## 2023-09-26 NOTE — ED Provider Notes (Signed)
 Emergency Department Encounter  Location: [facility] REGIONAL EMERGENCY DEPARTMENT    Patient: Dalton Warner  MRN: [id number]  DOB: 06-24-1930  Date of evaluation: 09/26/2023  ED Provider: Lonni Gurney, DO    Dalton Warner was checked out to me by Dr. Vinson. Please see his/her initial documentation for details of the patient's initial ED presentation, physical exam and completed studies.    In brief, Dalton Warner is a 89 y.o. male that presented to the emergency department today with concern of surgical site in his right groin.  Started to notice bruising yesterday, so comes emergency department.  Had Watchman device placed on 09/22/2023 and denied bruising to area prior to sneezing yesterday afternoon.  At time of signout, pending CTA of the lower extremities to look for surgical complication including pseudoaneurysm.    I have reviewed and interpreted all of the currently available lab results and diagnostics from this visit:  Results for orders placed or performed during the hospital encounter of 09/26/23   CBC with Auto Differential   Result Value Ref Range    WBC 9.9 4.0 - 10.5 k/uL    RBC 3.68 (L) 4.60 - 6.20 m/uL    Hemoglobin 10.1 (L) 13.5 - 18.0 g/dL    Hematocrit 69.8 (L) 42.0 - 52.0 %    MCV 81.8 78.0 - 100.0 fL    MCH 27.4 27.0 - 31.0 pg    MCHC 33.6 32.0 - 36.0 g/dL    RDW 79.1 (H) 88.2 - 14.9 %    Platelet, Fluorescence 76 (L) 140 - 440 k/uL    Neutrophils % 65 36 - 66 %    Lymphocytes % 19 (L) 24 - 44 %    Monocytes % 14 (H) 0 - 5 %    Eosinophils % 1 0.0 - 3.0 %    Basophils % 0 0 - 1 %    Immature Granulocytes % 1 (H) 0 %    Neutrophils Absolute 6.47 k/uL    Lymphocytes Absolute 1.83 k/uL    Monocytes Absolute 1.39 k/uL    Eosinophils Absolute 0.11 k/uL    Basophils Absolute 0.03 k/uL    Immature Granulocytes Absolute 0.06 k/uL   Basic Metabolic Panel   Result Value Ref Range    Sodium 129 (L) 136 - 145 mmol/L    Potassium 4.1 3.5 - 5.1 mmol/L    Chloride 97 (L) 99 - 110 mmol/L     CO2 22 21 - 32 mmol/L    Anion Gap 9 9 - 17 mmol/L    Glucose 110 (H) 74 - 99 mg/dL    BUN 18 7 - 20 mg/dL    Creatinine 0.9 0.8 - 1.3 mg/dL    Est, Glom Filt Rate 76 >60 mL/min/1.34m2    Calcium  8.8 8.3 - 10.6 mg/dL   Protime-INR   Result Value Ref Range    Protime 15.2 (H) 11.7 - 14.5 sec    INR 1.1    PTT   Result Value Ref Range    APTT 36.2 25.1 - 37.1 sec   Platelet Confirmation   Result Value Ref Range    Platelet Confirmation Decreased Platelets      CTA LOWER EXTREMITY RIGHT W CONTRAST  Result Date: 09/26/2023  PROCEDURE: CTA LOWER EXTREMITY RIGHT W CONTRAST DATE OF EXAM:  09/26/2023 5:27 DEMOGRAPHICS: 89 years old Male INDICATION: Vascular access obtained on 08/14 for placement of Watchman device, patient with increased pain and bruising with swelling at right groin  insertion site today, evaluate for vascular complication COMPARISON: No existing relevant imaging study corresponding to the same anatomical region is available. CONTRAST USED FOR THIS EXAM:  IOPAMIDOL  76 % IV SOLN 75mL TECHNIQUE: Helical images were obtained in the axial plane during the rapid administration of intravenous contrast. The exam was timed to best evaluate and opacify the arterial structures. The examination is reviewed at lung and soft tissue windows, then 2D and 3D reconstructions are performed of the target arterial structures. Imaging performed from distal aorta through the distal right lower extremity, slightly delayed exam was performed from the above-knee popliteal through distal runoff. Limitations: Initial scan was limited at the popliteal level and below due to timing. An additional scan was performed through the distal lower extremity which has improved contrast in the arteries, although assessment is limited by vessel calcifications and small caliber in the distal ankle and foot. FINDINGS: CTA: Visualized aorta and iliac arteries:  Normal caliber distal aorta and visualized  iliac arteries are patent, without  significant stenosis. No dissection. Atherosclerotic disease present. Right lower extremity:  Patent right common femoral artery, SFA, profunda and popliteal arteries without significant stenosis. No evidence of pseudoaneurysm, dissection, or injury. Three-vessel runoff to the ankle appears patent. The posterior tibial artery shows a short segment of occlusion or high degree of narrowing at the level of the lower ankle (image 102 series 301) however is patent distal to this. Limited  assessment of the distal small vessels in the foot due to timing and small caliber. Dorsalis penis is patent in the proximal forefoot. Soft tissues: In the right groin and prepubic region, there is some soft tissue fat stranding in the subcutaneous tissues. A small superficial collection is about 1.4 x 0.8 cm and is not close proximity to the underlying vessels, probably a small superficial hematoma. No soft tissue gas. No lymphadenopathy pelvis or groin. Incidental small knee joint effusion is noted. Soft tissues in the right lower extremity are otherwise normal. There is nonspecific small amount of free fluid in the dependent pelvis. Bladder  appears intact. The visualized large and small bowel are normal in caliber with  no evidence of inflammatory change or obstruction. Prostate size is small. There are some metallic clips or markers along the prostatic apex. Bones: No acute fracture. There is no suspicious osseous lesion evident. Degenerative changes are present in the imaged portions of the lumbar spine, right hip, pubic symphysis, all compartments of the right knee. IMPRESSION: VASCULAR: 1.  No evidence of acute vascular injury, pseudoaneurysm or dissection. Small superficial hematoma (1.4 x 0.8 cm) in the right groin and some soft tissue fat stranding (edema versus cellulitis) in the subcutaneous right groin and prepubic  region are noted. 2.  Patent three-vessel runoff in the right lower extremity. Possible small focal area of  significant stenosis or occlusion distal posterior tibial artery at the level of the lower ankle, however assessment limited by small caliber of the vessel and vessel wall calcifications. NONVASCULAR: 1.  Nonspecific small amount of free fluid in the pelvis. 2.  Small right knee joint effusion. Recommendations for management of fusiform AAA: *  2.6-2.9 cm aorta, recommend follow-up every 5 years or aortas meeting the criteria for AAA (>1.5 x proximal normal segment; no f/u if < 1.5 x proximal normal segment; no f/u for aortas < 2.6 cm). *  3.0-3.4 cm AAA, recommend follow-up every 3 years. *  3.5-3.9 cm AAA, recommend follow-up every 2 years. *  4.0-4.4 cm AAA, recommend follow-up  every 12 months and recommend vascular consultation. *  4.5-5.4 cm AAA, recommend follow-up every 6 months and recommend vascular consultation. *  >5.5 cm AAA, recommend referral to vascular specialist. ** For management of saccular abdominal aortic aneurysms of any size, recommend vascular consultation. **Note: For AAA enlargement of >0.5 cm in 6 months or >1 cm in 1 year, recommend  vascular consultation. References: JINNY [employer]ter Radiol 2013; 10(10):789-794; J Vasc Surg. 2018; 67:2-77 This dictation was created with voice recognition software.  While attempts have  been made to review the dictation as it is transcribed, on occasion the spoken word can be misinterpreted by the technology leading to omissions or inappropriate words, phrases or sentences.  Dictated and Electronically Signed By: Keri Azuar, MD [facility] 09/26/2023 6:30          Final ED Course and MDM:  CT resulted without evidence of vascular injury, pseudoaneurysm, or dissection.  Does show small 1.4 x 0.8 cm hematoma.  Also shows significant stenosis or occlusion to distal posterior tibial artery of the right lower extremity.    I discussed CT findings with patient.  On exam of his foot his foot is warm, has palpable DP and PT pulses and doubt acute  ischemic limb.  The occlusion of distal posterior tibial artery is likely a incidental finding not of significance.  I did discuss this with him and will have him follow-up with Dr. Selestine regarding it.  Patient does complain of cold lower extremities, equal bilaterally which is not consistent with ischemic leg.    Patient understands discharge instructions and will make follow-up appointment sooner.  He does have an appointment with Dr. Selestine in 8 days.    ED Medication Orders (From admission, onward)      Start Ordered     Status Ordering Provider    09/26/23 0528 09/26/23 0528  iopamidol  (ISOVUE -370) 76 % injection 75 mL  IMG ONCE PRN         Last MAR action: Given - by MEADE, JESSICA on 09/26/23 at 0528 [facility]            Final Impression      1. Groin swelling    2. Encounter for post surgical wound check    3. Presence of Watchman left atrial appendage closure device        DISPOSITION Decision To Discharge 09/26/2023 06:52:18 AM   DISPOSITION CONDITION Stable          (Please note that portions of this note may have been completed with a voice recognition program. Efforts were made to edit the dictations but occasionally words are mis-transcribed.)    Lonni Gurney, DO  US  [employer]        Makynzee Tigges, [place], [place]  09/26/23 [phone]

## 2023-09-26 NOTE — ED Provider Notes (Signed)
 CHIEF COMPLAINT    Chief Complaint   Patient presents with    Post-op Problem     HPI  Dalton Warner is a 89 y.o. male with history of atrial fibrillation, hypertension, Watchman device placement who presents to the ED via EMS with concerns for increased bruising and swelling at right groin site where he had placement of Watchman device on 08/14 with Dr. Selestine of cardiology.  Postoperative course went well.  Patient was discharged on dual antiplatelet therapy with aspirin  and Plavix .  Patient states that he had a vigorous sneezing episode around 3 PM yesterday.  Since that time he has been monitoring the right groin as he was concerned about the sneezing episode causing problems.  He began to notice this evening increased bruising and swelling to the right groin site that he states was not present previously.  He has no pain in this area but is concerned due to the increase swelling or bruising.  Symptoms rated as mild in severity.  Nothing makes symptoms better or worse.  Denies abdominal pain, flank pain, dizziness, lightheadedness, nausea, vomiting      REVIEW OF SYSTEMS  Constitutional: No fever, chills or recent illness.   Eye: No visual changes  HENT: No earache or sore throat.  Resp: No SOB or productive cough.  Cardio: No chest pain or palpitations. Complains of swelling and bruising of right groin  GI: No abdominal pain, nausea, vomiting, constipation or diarrhea. No melena.  GU: No dysuria, urgency or frequency.  Endocrine: No heat intolerance, no cold intolerance, no polydipsia   Lymphatics: No adenopathy  Musculoskeletal: No new muscle aches or joint pain.  Neuro: No headaches.  Psych: No homicidal or suicidal thoughts  Skin: No rash, No itching.  ?  ?  PAST MEDICAL HISTORY  Past Medical History:   Diagnosis Date    Arthritis     Atrial fibrillation [facility])     Bleeding ulcer 08/2018    Cancer [facility])     Prostate cancer dx March 2010- tx with radiation     Colon cancer [facility] 2000    per old chart dx with  colon ca - tx with surgery only    Deficient knowledge of transesophageal echocardiography (TEE) 08/15/2023    Diabetes mellitus (HCC)     was borderline- at one time was on Metformin for several yrs-  when I went to [facility] they had me stop this in 2017    DVT (deep venous thrombosis) (HCC) 2015    States DVT 2014 and 2015    Fracture     had broken neck in Nov 2016- wore a collar for 6 weeks only    HOH (hard of hearing)     bil hearing aide    Hx of blood clots     had blood clot - it was a  blood clot in  my esophagus     Hx of Doppler ultrasound 09/19/2019    EF IS 55-60%  Bi atrial enlargement noted.  Moderate mitral, aortic, tricuspid and mild pulmonic regurgitation is present    Hx of fall     last fall 2016- use cane walker and have electric chair    Hyperlipidemia     Hypertension     follow with Dr Orvel at [facility]    Limited range of motion (ROM) of shoulder     right shoulder haved 10 % usage and left shoulder 60 % of usage    Neuropathy  both feet    Sleep apnea     had sleep study- tried to give me cpap but could not tolerate it     TIA (transient ischemic attack)     'in Jan 2009- trouble with speech, numbness of lip, weakness right arm - all only lasted for 20 seconds    Unsteady gait     hx of back problem- was on steroids for 4 yrs- since then stability issues with my back -     Wears glasses     to read      FAMILY HISTORY  Family History   Problem Relation Age of Onset    Cancer Mother         lung cancer    Diabetes Mother     Diabetes Sister     Cancer Brother     High Blood Pressure Brother      SOCIAL HISTORY  Social History     Socioeconomic History    Marital status: Widowed     Spouse name: None    Number of children: None    Years of education: None    Highest education level: None   Tobacco Use    Smoking status: Never    Smokeless tobacco: Never   Vaping Use    Vaping status: Never Used   Substance and Sexual Activity    Alcohol use: Yes     Comment:  maybe 6 times per year when had prostate cancer did drink one glass red wine daily in the past  1cup coffee    Drug use: Never     Social Drivers of Health     Transportation Needs: No Transportation Needs (09/23/2023)    PRAPARE - Therapist, art (Medical): No     Lack of Transportation (Non-Medical): No   Housing Stability: Low Risk  (09/23/2023)    Housing Stability Vital Sign     Unable to Pay for Housing in the Last Year: No     Number of Times Moved in the Last Year: 0     Homeless in the Last Year: No       SURGICAL HISTORY  Past Surgical History:   Procedure Laterality Date    ABDOMEN SURGERY  2000    per old chart had right hemicolectomy removed 2 and a half feet of colon and my appendix    COLONOSCOPY  2019    CT BIOPSY BONE MARROW  11/02/2016    CT BONE MARROW BIOPSY 11/02/2016    ENDOSCOPY, COLON, DIAGNOSTIC      per old chart had EGD done 08/2018 and 09/2018    EP DEVICE PROCEDURE N/A 09/22/2023    Watchman laa closure device performed by Cosette Selestine Rank, [facility] at [facility] CARDIAC C[facility]    HERNIA REPAIR      per old chart open left ing hernia 1980 and then recurrent left ing hernia repair 08/2018    JOINT REPLACEMENT      per old chart total left knee 2018    PACEMAKER PLACEMENT      per old chart hx of st jude pacer insertion 2008- new pacer inserted 2019    SHOULDER SURGERY Left 2015    rota. cuff    SHOULDER SURGERY Right 02/27/2019    RIGHT REVERSE SHOULDER TOTAL ARTHROPLASTY REVERSE performed by Chauncey Gladis Hummer, MD at [facility] OR     CURRENT MEDICATIONS  Previous Medications    ALBUTEROL  SULFATE (PROAIR   RESPICLICK) 108 (90 BASE) MCG/ACT AEROSOL POWDER INHALATION    2 puffs every 6 hours as needed    ALFUZOSIN (UROXATRAL) 10 MG EXTENDED RELEASE TABLET    Take 1 tablet by mouth daily    AMMONIUM LACTATE (AMLACTIN) 12 % CREAM        ASPIRIN  81 MG CHEWABLE TABLET    Take 1 tablet by mouth daily    ASPIRIN  81 MG EC TABLET    Take 1 tablet by mouth daily    ATORVASTATIN  (LIPITOR ) 40  MG TABLET    Take 1 tablet by mouth daily    CETIRIZINE  (ZYRTEC ) 10 MG TABLET    Take 1 tablet by mouth daily    CLOPIDOGREL  (PLAVIX ) 75 MG TABLET    Take 1 tablet by mouth daily    CLOPIDOGREL  (PLAVIX ) 75 MG TABLET    Take 1 tablet by mouth daily    FLUOROURACIL (EFUDEX) 5 % CREAM    Apply TOPICALLY TO skin cancer on left arm TWICE DAILY FOR 6 weeks. Wash hands after application.    GUAIFENESIN  (MUCINEX ) 600 MG EXTENDED RELEASE TABLET    Take 2 tablets by mouth 2 times daily    KETOCONAZOLE (NIZORAL) 2 % CREAM        KETOCONAZOLE (NIZORAL) 2 % SHAMPOO        METOPROLOL  SUCCINATE (TOPROL  XL) 50 MG EXTENDED RELEASE TABLET    Take 1 tablet by mouth nightly    MONTELUKAST  (SINGULAIR ) 10 MG TABLET    1 tablet nightly    PANTOPRAZOLE  (PROTONIX ) 40 MG TABLET    Take 1 tablet by mouth 2 times daily (before meals)    SYSTANE 0.4-0.3 % OPHTHALMIC SOLUTION         ALLERGIES  Allergies   Allergen Reactions    Cat Dander Other (See Comments)     Runny nose    Dust Mite Extract Other (See Comments)     Runny nose    Other      Other reaction(s): Other (See Comments)  Dust/cats - sneezing    Fish Allergy Nausea And Vomiting     Salmon only  Salmon only       Nursing notes reviewed by myself for past medical history, family history, social history, surgical history, current medications, and allergies.    PHYSICAL EXAM  VITAL SIGNS: Triage VS:    ED Triage Vitals   Encounter Vitals Group      BP       Girls Systolic BP Percentile       Girls Diastolic BP Percentile       Boys Systolic BP Percentile       Boys Diastolic BP Percentile       Pulse       Resp       Temp       Temp src       SpO2       Weight       Height       Head Circumference       Peak Flow       Pain Score       Pain Loc       Pain Education       Exclude from Growth Chart      Constitutional: Well developed, Well nourished, non-toxic appearing  HENT: Normocephalic, Atraumatic, Bilateral external ears normal, Oropharynx moist, No oral exudates, Nose normal.    Eyes: Conjunctiva normal, No discharge. No scleral icterus.  Neck: Normal range of motion, No tenderness, Supple.  Lymphatic: No lymphadenopathy noted.   Cardiovascular: Normal heart rate, Normal rhythm, No murmurs, gallops or rubs.   Thorax & Lungs: Normal breath sounds, No respiratory distress, No wheezing.  Abdomen: Soft, No tenderness, No masses, No pulsatile masses, No distention, Normal bowel sounds  Skin: Warm, Dry, Pink, No mottling, No erythema, No rash.   Back: No tenderness, No CVA tenderness.   Extremities: Ecchymosis and soft tissue swelling to right groin with small amount of palpable induration but not pulsatile masses, No cyanosis, Normal perfusion with 2+ dorsalis pedis and posterior tibial pulses to right lower extremity, No clubbing.   Musculoskeletal: Good range of motion in all major joints as observed. No major deformities noted.   Neurologic: Alert & oriented x 3, No focal deficits noted.   Psychiatric: Affect normal, Judgment normal, Mood normal.       RADIOLOGY  I personally reviewed the images. The radiologist's interpretation reveals:  Last Imaging results   CTA LOWER EXTREMITY RIGHT W CONTRAST    (Results Pending)       I have reviewed andinterpreted all of the currently available lab results from this visit (if applicable):   Labs Reviewed   CBC WITH AUTO DIFFERENTIAL - Abnormal; Notable for the following components:       Result Value    RBC 3.68 (*)     Hemoglobin 10.1 (*)     Hematocrit 30.1 (*)     RDW 20.8 (*)     Platelet, Fluorescence 76 (*)     Lymphocytes % 19 (*)     Monocytes % 14 (*)     Immature Granulocytes % 1 (*)     All other components within normal limits   BASIC METABOLIC PANEL - Abnormal; Notable for the following components:    Sodium 129 (*)     Chloride 97 (*)     Glucose 110 (*)     All other components within normal limits   PROTIME-INR - Abnormal; Notable for the following components:    Protime 15.2 (*)     All other components within normal limits   APTT    PLATELET CONFIRMATION       MEDS GIVEN IN ED:  Medications   iopamidol  (ISOVUE -370) 76 % injection 75 mL (75 mLs IntraVENous Given 09/26/23 0528)     COURSE & MEDICAL DECISION MAKING  This is a 89 year old male that presents to the emergency department via EMS with concerns for bruising and swelling to right groin after ongoing Urecholine Watchman device placement on 08/14 with a sneezing episode and bruising/swelling to right groin today.  Initial vital signs reassuring.  Patient nontoxic-appearing on exam.  He does have ecchymosis and some soft tissue swelling at right groin Watchman device insertion site with a small amount of palpable induration.  No palpable pulsatile masses noted.  He has 2+ dorsalis pedis and posterior tibial pulses to right lower extremity.    Differentials at this time include but not limited to: Postoperative swelling and bruising, pseudoaneurysm, dissection of femoral vein, postoperative hematoma    At this time we will obtain CTA of the right lower extremity as well as CBC, BMP, coags.  Patient is currently pain-free and has not experienced any pain associated with this.    Patient CBC reassuring.  BMP shows mild hyponatremia sodium 129.    At this time CTA of the right lower extremity pending.  Signed out to attending physician  NIH score 0    Amount and/or Complexity of Data Reviewed  Clinical lab tests: reviewed  Decide to obtain previous medical records or to obtain history from someone other than the patient: yes       -  Patient seen and evaluated in the emergency department.  -  Triage and nursing notes reviewed and incorporated.  -  Old chart records reviewed and incorporated.  -  Work-up included:  See above      Appropriate PPE utilized as indicated for entire patient encounter?  Time of Disposition: See timeline      Independent Imaging Interpretation by me: None     EKG (if obtained):  None     Chronic conditions affecting care: Recent watchman device placement      Discussion with Other Profesionals : None       Social Determinants : None       I am the Primary Clinician of Record.    ?  New Prescriptions    No medications on file     FINAL IMPRESSION  1. Groin swelling    2. Encounter for post surgical wound check    3. Presence of Watchman left atrial appendage closure device        Electronically signed by: Boone Gear Secretary, DO, 09/26/2023         Vinson Bertin, DO  09/26/23 [id number]

## 2023-09-29 ENCOUNTER — Ambulatory Visit: Admit: 2023-09-29 | Discharge: 2023-09-29 | Payer: MEDICARE | Attending: Nurse Practitioner | Primary: Family

## 2023-09-29 VITALS — BP 112/60 | HR 62 | Wt 169.4 lb

## 2023-09-29 DIAGNOSIS — I4821 Permanent atrial fibrillation: Principal | ICD-10-CM

## 2023-09-29 NOTE — Progress Notes (Signed)
 CARDIOLOGY  NOTE    09/29/2023    Dalton Warner (DOB:  Jun 18, 1930) is a 89 y.o. male,an established patient with Dr. Selestine, here for evaluation of the following chief complaint(s):  Follow-up (1 Week FU. WM. Pt states SOB ongoing for years. Denies all other cardiac symptoms. )          SUBJECTIVE/OBJECTIVE:    HPI  Dalton Warner is here post [facility] 09/22/2023. It was placed for stroke prevention due to history of PAF. Patient denies any issues.     History of Present Illness  The patient presents for evaluation of a hematoma.    He reports that Dr. Lucia performed a procedure on him last week, which he believes was successful. However, on the third night post-procedure, he noticed redness and swelling around the incision site, with a black discoloration extending beyond the red area. The area remains hard and swollen but has not increased in size. He sought emergency care where it was diagnosed as a hematoma.    He also mentions a balance issue that began in 2009, making walking very difficult. He uses an Art gallery manager for mobility during long walks. He recalls a similar incident a few years ago when he had a similar issue on his arm, which was treated by Dr. Sebastian.    He is currently on Plavix  and aspirin , having discontinued Eliquis . He experienced severe falls in 2009, one of which resulted in a bruised shoulder. He also had a total knee replacement due to a fall. His right shoulder, affected by arthritis, was treated by Dr. Sebastian in 2020. He also suffered a broken neck. He reports no alcohol consumption before or after the procedure. He experienced a sensation of coolness in his leg on the third night post-procedure, which he describes as similar to an ice burn. This symptom resolved the following morning.    PAST SURGICAL HISTORY:  Total knee replacement, right shoulder surgery in 2020, and treatment for a broken neck.    SOCIAL HISTORY  Occupations: Dermatology-related work  Exercise: Uses a  scooter for mobility due to balance issues  Alcohol: Does not drink alcohol       Review of Systems   Constitutional:  Negative for fatigue and fever.   Respiratory:  Negative for cough and shortness of breath.    Cardiovascular:  Negative for chest pain, palpitations and leg swelling.   Musculoskeletal:  Positive for gait problem. Negative for arthralgias.   Neurological:  Negative for dizziness, syncope, weakness, light-headedness and headaches.       Vitals:    09/29/23 1352   BP: 112/60   BP Site: Left Upper Arm   Patient Position: Sitting   BP Cuff Size: Medium Adult   Pulse: 62   Weight: 76.8 kg (169 lb 6.4 oz)       Wt Readings from Last 3 Encounters:   09/29/23 76.8 kg (169 lb 6.4 oz)   09/26/23 76.7 kg (169 lb)   09/22/23 77.1 kg (170 lb)       BP Readings from Last 3 Encounters:   09/29/23 112/60   09/26/23 (!) 151/79   09/23/23 (!) 158/86       Prior to Admission medications   Medication Sig Start Date End Date Taking? Authorizing Provider   aspirin  81 MG EC tablet Take 1 tablet by mouth daily 09/23/23  Yes Moishe Bruckner T, PA-C   clopidogrel  (PLAVIX ) 75 MG tablet Take 1 tablet by mouth daily 09/23/23  Yes  Moishe Bruckner T, PA-C   fluorouracil (EFUDEX) 5 % cream Apply TOPICALLY TO skin cancer on left arm TWICE DAILY FOR 6 weeks. Wash hands after application. 07/28/23  Yes [provider]   atorvastatin  (LIPITOR ) 40 MG tablet Take 1 tablet by mouth daily 01/13/23  Yes Bertell Clarine Nurse, MD   metoprolol  succinate (TOPROL  XL) 50 MG extended release tablet Take 1 tablet by mouth nightly 01/13/23  Yes Bertell Clarine Nurse, MD   SYSTANE 0.4-0.3 % ophthalmic solution  06/08/22  Yes [provider]   ketoconazole (NIZORAL) 2 % shampoo  01/07/20  Yes [provider]   ketoconazole (NIZORAL) 2 % cream  01/07/20  Yes [provider]   ammonium lactate (AMLACTIN) 12 % cream  08/27/19  Yes [provider]   cetirizine  (ZYRTEC ) 10 MG tablet Take 1 tablet by mouth daily   Yes  [provider]   pantoprazole  (PROTONIX ) 40 MG tablet Take 1 tablet by mouth 2 times daily (before meals) 03/05/19  Yes Eziokwu, Akaolisa, MD   guaiFENesin  (MUCINEX ) 600 MG extended release tablet Take 2 tablets by mouth 2 times daily   Yes [provider]   alfuzosin (UROXATRAL) 10 MG extended release tablet Take 1 tablet by mouth daily   Yes [provider]   albuterol  sulfate (PROAIR  RESPICLICK) 108 (90 Base) MCG/ACT aerosol powder inhalation 2 puffs every 6 hours as needed   Yes [provider]   montelukast  (SINGULAIR ) 10 MG tablet 1 tablet nightly 01/09/18  Yes [provider]   aspirin  81 MG chewable tablet Take 1 tablet by mouth daily  Patient not taking: Reported on 09/29/2023 09/24/23   Moishe Bruckner T, PA-C   clopidogrel  (PLAVIX ) 75 MG tablet Take 1 tablet by mouth daily  Patient not taking: Reported on 09/29/2023 09/24/23   Moishe Bruckner ONEIDA DEVONNA       Physical Exam  Vitals reviewed.   Constitutional:       General: He is not in acute distress.     Appearance: Normal appearance. He is obese. He is not ill-appearing.   HENT:      Head: Atraumatic.   Neck:      Vascular: No carotid bruit.   Cardiovascular:      Rate and Rhythm: Normal rate. Rhythm irregular.      Pulses: Normal pulses.      Heart sounds: Normal heart sounds. No murmur heard.  Pulmonary:      Effort: Pulmonary effort is normal. No respiratory distress.      Breath sounds: Normal breath sounds.   Musculoskeletal:         General: No swelling or deformity.      Cervical back: Neck supple. No muscular tenderness.   Skin:     Comments: Subcutainous Hematoma to R groin egg shape approximately 1 in by 2.5 inches   Neurological:      Mental Status: He is alert.           Lab Review   Lab Results   Component Value Date/Time    NA 129 09/26/2023 04:55 AM    K 4.1 09/26/2023 04:55 AM    CL 97 09/26/2023 04:55 AM    CO2 22 09/26/2023 04:55 AM    BUN 18 09/26/2023 04:55 AM    CREATININE 0.9 09/26/2023  04:55 AM    GLUCOSE 110 09/26/2023 04:55 AM    CALCIUM  8.8 09/26/2023 04:55 AM     Lab Results   Component Value  Date/Time    WBC 9.9 09/26/2023 04:55 AM    HGB 10.1 09/26/2023 04:55 AM    HCT 30.1 09/26/2023 04:55 AM    MCV 81.8 09/26/2023 04:55 AM    PLT 86 07/29/2022 07:20 AM          Echocardiogram: 09/22/2023  Post: Successful implantation of a 24 mm Watchman device.  The device appears well seated with good compression.  No residual leak noted.  No evidence of any pericardial effusion.       Watchman placement 09/22/2023  Assessment and Summary:     Successful deployment of left atrial appendage occlusion device with no complication     WATCHMAN device used 24 mm size      Angiogram revealed good seal and no thrombus      TEE confirmed placement and seal     EBL Less than 20 mL  No complications        ASSESSMENT/PLAN:  Post watchman procedure  Continue ASA and Plavix  for 6 months  Will be on asa for life  Plan for post procedure in 45-60 days from placement.   Patient states understanding and is agreeable to TEE  EKG today is Vpaced      No follow-ups on file.      An electronic signature was used to authenticate this note.    Electronically signed by Annabella GORMAN Shiver, APRN - CNP on 09/29/2023 at 2:05 PM

## 2023-09-29 NOTE — Progress Notes (Signed)
 CLINICAL STAFF DOCUMENTATION    Annabella Shiver, CNP     ZUBAIR LOFTON  1930-12-24  [id number]    Have you had any Chest Pain recently? - No        Have you had any Shortness of Breath - yes, ongoing for years per pt.     Have you had any dizziness - No    Have you had any palpitations recently? - No    Do you have any edema - swelling in No        When did you have your last labs drawn 09/2023  What doctor ordered [facility]     Do you need any prescriptions refilled? - No    Do you have a surgery or procedure scheduled in the near future - No    Do use tobacco products? - No  Do you drink alcohol? - 1 glass of wine nightly.   Do you use any illicit drugs? - No  Caffeine? - 1 cup coffee daily.     Check medication list thoroughly!!! AND RECONCILE OUTSIDE MEDICATIONS  If dose has changed change the entire order not just the MG  BE SURE TO ASK PATIENT IF THEY NEED MEDICATION REFILLS  Verify Pharmacy and update if incorrect    Add to every patient's wrap up the following dot phrase AFTERVISITCARDIOHEARTHOUSE and ensure we explain this to our patients

## 2023-09-30 NOTE — Telephone Encounter (Signed)
 Patient said Dalton Warner found a hematoma on the pt's skin, he said [name] had done something to make it soft but now it is hard again. Patient was just calling to let [name] know.

## 2023-09-30 NOTE — Telephone Encounter (Signed)
 It is not bigger and has not moved.

## 2023-10-03 NOTE — Telephone Encounter (Signed)
 Tried to call pt. No answer/No VN set up

## 2023-10-05 ENCOUNTER — Ambulatory Visit: Admit: 2023-10-05 | Discharge: 2023-10-05 | Payer: MEDICARE | Attending: Internal Medicine | Primary: Family

## 2023-10-05 ENCOUNTER — Telehealth

## 2023-10-05 VITALS — BP 110/68 | HR 62 | Ht 67.0 in | Wt 168.0 lb

## 2023-10-05 DIAGNOSIS — I4821 Permanent atrial fibrillation: Principal | ICD-10-CM

## 2023-10-05 NOTE — Telephone Encounter (Signed)
 Post Wm TEE ordered.

## 2023-10-05 NOTE — Progress Notes (Signed)
 Dalton Wallie Mao, MD                                  CARDIOLOGY  NOTE         Chief Complaint:    Chief Complaint   Patient presents with    Follow-up     Pt states no new cardiac sx at this time.     Shortness of Breath     SOBOE for the last 6-7 years       Repeat falls, easy bruising  Atrial fibrillation    Echo: 09/19/2019    Left ventricular function is normal, EF is estimated at 55-60%.   Mild left ventricular hypertrophy.   Diastolic dysfunction could not be evaluated due to arrhythmia.   Abnormal (paradoxical) motion consistent with RV pacemaker.   No regional wall motion abnormalities were detected.   Bi atrial enlargement noted.   Sclerotic, but non-stenotic aortic valve.   Mitral annular calcification is present.   Moderate mitral, aortic, tricuspid and mild pulmonic regurgitation is   present.   Mild pulmonary hypertension with an RVSP of .   Dilation of the aortic root(4.0) and the ascending aorta(4.2).   No evidence of pericardial effusion.      Prior HPI:     Dalton Warner is a 89 y.o. year old male who was seen in the hospital for chronic atrial fibrillation.  Patient has history of chronic thrombocytopenia.  He has recently moved into town to take care of his wife.  Patient also has a history of LV thrombus in the past.    Chronic atrial fibrillation  CHA2DS2-VASc score is 6     EKG: Atrial fibrillation at 66 bpm      Current Outpatient Medications   Medication Sig Dispense Refill    clopidogrel  (PLAVIX ) 75 MG tablet Take 1 tablet by mouth daily 30 tablet 0    aspirin  81 MG EC tablet Take 1 tablet by mouth daily 90 tablet 1    fluorouracil (EFUDEX) 5 % cream Apply TOPICALLY TO skin cancer on left arm TWICE DAILY FOR 6 weeks. Wash hands after application.      atorvastatin  (LIPITOR ) 40 MG tablet Take 1 tablet by mouth daily 30 tablet 5    metoprolol  succinate (TOPROL  XL) 50 MG extended release tablet Take 1 tablet by mouth nightly 90 tablet 3    SYSTANE 0.4-0.3 % ophthalmic solution        ketoconazole (NIZORAL) 2 % shampoo       ketoconazole (NIZORAL) 2 % cream       ammonium lactate (AMLACTIN) 12 % cream       cetirizine  (ZYRTEC ) 10 MG tablet Take 1 tablet by mouth daily      pantoprazole  (PROTONIX ) 40 MG tablet Take 1 tablet by mouth 2 times daily (before meals) 60 tablet 2    guaiFENesin  (MUCINEX ) 600 MG extended release tablet Take 2 tablets by mouth 2 times daily      alfuzosin (UROXATRAL) 10 MG extended release tablet Take 1 tablet by mouth daily      albuterol  sulfate (PROAIR  RESPICLICK) 108 (90 Base) MCG/ACT aerosol powder inhalation 2 puffs every 6 hours as needed      montelukast  (SINGULAIR ) 10 MG tablet 1 tablet nightly       No current facility-administered medications for this visit.       Allergies:     Cat dander, Dust mite  extract, Other, and Fish allergy    Patient History:    Past Medical History:   Diagnosis Date    Arthritis     Atrial fibrillation (HCC)     Bleeding ulcer 08/2018    Cancer [facility])     Prostate cancer dx March 2010- tx with radiation     Colon cancer [facility]) 2000    per old chart dx with colon ca - tx with surgery only    Deficient knowledge of transesophageal echocardiography (TEE) 08/15/2023    Diabetes mellitus (HCC)     was borderline- at one time was on Metformin for several yrs-  when I went to [facility] they had me stop this in 2017    DVT (deep venous thrombosis) (HCC) 2015    States DVT 2014 and 2015    Fracture     had broken neck in Nov 2016- wore a collar for 6 weeks only    HOH (hard of hearing)     bil hearing aide    Hx of blood clots     had blood clot - it was a  blood clot in  my esophagus     Hx of Doppler ultrasound 09/19/2019    EF IS 55-60%  Bi atrial enlargement noted.  Moderate mitral, aortic, tricuspid and mild pulmonic regurgitation is present    Hx of fall     last fall 2016- use cane walker and have electric chair    Hyperlipidemia     Hypertension     follow with Dr Dalton Warner at [facility]    Limited range of motion (ROM) of  shoulder     right shoulder haved 10 % usage and left shoulder 60 % of usage    Neuropathy     both feet    Sleep apnea     had sleep study- tried to give me cpap but could not tolerate it     TIA (transient ischemic attack)     'in Jan 2009- trouble with speech, numbness of lip, weakness right arm - all only lasted for 20 seconds    Unsteady gait     hx of back problem- was on steroids for 4 yrs- since then stability issues with my back -     Wears glasses     to read      Past Surgical History:   Procedure Laterality Date    ABDOMEN SURGERY  2000    per old chart had right hemicolectomy removed 2 and a half feet of colon and my appendix    COLONOSCOPY  2019    CT BIOPSY BONE MARROW  11/02/2016    CT BONE MARROW BIOPSY 11/02/2016    ENDOSCOPY, COLON, DIAGNOSTIC      per old chart had EGD done 08/2018 and 09/2018    EP DEVICE PROCEDURE N/A 09/22/2023    Watchman laa closure device performed by Dalton Selestine Rank, MD at [facility] CARDIAC CATH LAB    HERNIA REPAIR      per old chart open left ing hernia 1980 and then recurrent left ing hernia repair 08/2018    JOINT REPLACEMENT      per old chart total left knee 2018    PACEMAKER PLACEMENT      per old chart hx of st jude pacer insertion 2008- new pacer inserted 2019    SHOULDER SURGERY Left 2015    rota. cuff    SHOULDER SURGERY Right 02/27/2019    RIGHT REVERSE  SHOULDER TOTAL ARTHROPLASTY REVERSE performed by Dalton Gladis Hummer, MD at [facility] OR     Family History   Problem Relation Age of Onset    Cancer Mother         lung cancer    Diabetes Mother     Diabetes Sister     Cancer Brother     High Blood Pressure Brother      Social History     Tobacco Use    Smoking status: Never    Smokeless tobacco: Never   Substance Use Topics    Alcohol use: Yes     Comment: maybe 6 times per year when had prostate cancer did drink one glass red wine daily in the past  1cup coffee              Objective:      Physical Exam:    BP 110/68 (BP Site: Left Upper Arm, Patient Position:  Sitting, BP Cuff Size: Medium Adult)   Pulse 62   Ht 1.702 m (5' 7)   Wt 76.2 kg (168 lb)   BMI 26.31 kg/m   Wt Readings from Last 3 Encounters:   10/05/23 76.2 kg (168 lb)   09/29/23 76.8 kg (169 lb 6.4 oz)   09/26/23 76.7 kg (169 lb)     Body mass index is 26.31 kg/m.  Vitals:    10/05/23 1428   BP: 110/68   Pulse: 62        General Appearance and Constitutional: Conversant, Well developed, Well nourished, No acute distress, Non-toxic appearance.   HEENT:  Normocephalic, Atraumatic, Bilateral external ears normal, Oropharynx moist, No oral exudates,   Nose normal.   Neck- Normal range of motion, No tenderness, Supple  Eyes:  EOMI, Conjunctiva normal, No discharge.   Respiratory:  Normal breath sounds, No respiratory distress, No wheezing, No Rales, No Ronchi.  No chest tenderness.   Cardiovascular: S1-S2 IRIR, no added heart sounds, No Mumurs appreciated. No gallops, rubs. No Pedal Edema   GI:  Bowel sounds normal, Soft, No tenderness,  GU:  No costovertebral angle tenderness   Musculoskeletal:  No gross deformities. Back- No tenderness  Integument:  Well hydrated, no rash   Lymphatic:  No lymphadenopathy noted   Neurologic:  Alert & oriented x 3, Normal motor function, normal sensory function, no focal deficits noted   Psychiatric:  Speech and behavior appropriate       Medical decision making and Data review:    DATA:    Lab Results   Component Value Date    TROPONINT <0.010 03/26/2021     BNP:    Lab Results   Component Value Date    PROBNP 2,032 (H) 01/13/2020     PT/INR:  No results found for: PTINR  Lab Results   Component Value Date    LABA1C 5.8 02/26/2021    LABA1C 6.1 09/29/2020     Lab Results   Component Value Date    CHOL 80 02/26/2021    TRIG 125 02/26/2021    HDL 36 (L) 02/26/2021    LDLDIRECT 37 02/26/2021     Lab Results   Component Value Date    WBC 9.9 09/26/2023    HGB 10.1 (L) 09/26/2023    HCT 30.1 (L) 09/26/2023    MCV 81.8 09/26/2023    PLT 86 (L) 07/29/2022     TSH: No results  found for: TSH  Lab Results   Component Value Date    AST  20 05/06/2023    ALT 15 05/06/2023    BILITOT 0.7 05/06/2023    ALKPHOS 71 05/06/2023         All labs, medications and tests reviewed by myself including data and history from outside source , patient and available family .      1. Permanent atrial fibrillation (HCC)    2. Sick sinus syndrome (HCC)    3. Pacemaker    4. Essential hypertension                   Impression and Plan:    Chronic atrial fibrillation, with history of TIA as well as hx of LV thrombus.  CHA2DS2-VASc score is > 6. on low-dose Eliquis  understanding high risk of ischemic stroke versus bleeding risk. Cont BB  - No LV thrombus noted on last Echo     Anemia as well as thrombocytopenia ( Follows with Dr. Bethany) last hemoglobin 10.1.  Stable.  On Iron supplements.  Can be observed on low-dose OAC  Fall in October 2024  Status post Watchman procedure    Due to repeated falls, patient underwent Watchman procedure on 09/22/2023  Will obtain a follow-up TEE on October 6.  Continue with DAPT for 6 months    History of GI bleeding: Follows up with Dr. Denis -no acute bleeding lately.    Small hematoma right groin: Stable.  Will take some time to resolve, no further workup    Hyperlipidemia: Cont lipitor  40 mg daily      HTN: Cont Metoprolol  50 mg po daily, BP well controlled     Hyperlipidemia: Continue with Lipitor  40 mg daily    SSS s.p PPM . VVIR - Follow up with pacer clinic as outpt       Return in about 3 months (around 01/05/2024).          Counseled extensively and medication compliance urged.  We discussed that for the  prevention of ASCVD our  goal is aggressive risk modification.Patient is encouraged to exercise even a brisk walk for 30 minutes  at least 3 to 4 times a week   Various goals were discussed and questions answered. Continue current medications. Appropriate prescriptions are addressed and refills ordered.  Questions answered and patient verbalizes understanding.  Call for  any problems, questions, or concerns.

## 2023-10-05 NOTE — Telephone Encounter (Addendum)
 [facility]     Dr. EMERSON Wallie Mao     Transesophageal Echocardiogram    Patient Name: Dalton Warner   DOB: 1930-06-03   MRN# [id number]    Date of Procedure: 11/14/2023 Time: 10 Arrival Time: 9   (Arrival time is scheduled for one (1) hour before procedure is scheduled.)     Hospital: [facility]           Please take your insurance card and I.D. with you to the hospital.    X   Please do not have anything by mouth after midnight prior to or 8 hours before         the procedure.    X   You may take your medication with a sip of water unless advised otherwise below.      X Please continue to take Plavix / Aspirin  as directed.

## 2023-10-06 ENCOUNTER — Encounter: Payer: MEDICARE | Attending: Nurse Practitioner | Primary: Family

## 2023-10-18 MED ORDER — AMOXICILLIN 500 MG PO CAPS
500 | ORAL_CAPSULE | Freq: Once | ORAL | 2 refills | Status: AC
Start: 2023-10-18 — End: 2023-10-18

## 2023-10-18 NOTE — Telephone Encounter (Signed)
 Patient called he is to have a dental cleaning   10/2 and has the watchman , he was advised  He may need premedicated prior to his   Cleaning .

## 2023-10-18 NOTE — Telephone Encounter (Signed)
 Pt is aware of ATB sent to [facility]

## 2023-10-19 MED ORDER — CLOPIDOGREL BISULFATE 75 MG PO TABS
75 | ORAL_TABLET | Freq: Every day | ORAL | 1 refills | 30.00000 days | Status: DC
Start: 2023-10-19 — End: 2024-01-24

## 2023-10-26 NOTE — Telephone Encounter (Signed)
 Placed call - spoke with daughter- notified of date and time for post WM TEE.

## 2023-11-09 NOTE — Telephone Encounter (Signed)
Returned call - reached VM.

## 2023-11-09 NOTE — Telephone Encounter (Signed)
 Pt is returning call to [facility]

## 2023-11-09 NOTE — Telephone Encounter (Signed)
 Patient called & educated on TEE for Dx: Afib.  Procedure is scheduled for 10/6 @ 10, w/arrival @ 9, @ [facility].   Procedure and risks were explained to patient. Consent forms were signed.  Instructions were given to patient to remain NPO after midnight the night before procedure.  Patient may take morning meds the morning of procedure with small amount of water.     Patient was notified that procedure could be delayed due to an emergency. Patient voiced understanding. Copies of consent, pre-testing orders, & instructions scanned into media

## 2023-11-09 NOTE — Telephone Encounter (Signed)
 Placed call to patient to discuss TEE instructions. Left VM requesting return call to discuss.

## 2023-11-09 NOTE — Telephone Encounter (Signed)
 Patient was returning a call to [place].

## 2023-11-14 ENCOUNTER — Inpatient Hospital Stay: Admit: 2023-11-14 | Discharge: 2023-11-21 | Payer: MEDICARE | Attending: Internal Medicine | Primary: Family

## 2023-11-14 VITALS — BP 139/85 | HR 75 | Resp 16 | Ht 67.0 in | Wt 168.0 lb

## 2023-11-14 DIAGNOSIS — I480 Paroxysmal atrial fibrillation: Principal | ICD-10-CM

## 2023-11-14 MED ORDER — NALOXONE HCL 0.4 MG/ML IJ SOLN
0.4 | INTRAMUSCULAR | Status: AC
Start: 2023-11-14 — End: 2023-11-14

## 2023-11-14 MED ORDER — FENTANYL CITRATE (PF) 100 MCG/2ML IJ SOLN
100 | INTRAMUSCULAR | Status: AC
Start: 2023-11-14 — End: 2023-11-14

## 2023-11-14 MED ORDER — MIDAZOLAM HCL 2 MG/2ML IJ SOLN
2 | INTRAMUSCULAR | Status: AC | PRN
Start: 2023-11-14 — End: 2023-11-14
  Administered 2023-11-14: 15:00:00 1 via INTRAVENOUS

## 2023-11-14 MED ORDER — FLUMAZENIL 0.5 MG/5ML IV SOLN
0.5 | INTRAVENOUS | Status: AC
Start: 2023-11-14 — End: 2023-11-14

## 2023-11-14 MED ORDER — LIDOCAINE VISCOUS HCL 2 % MT SOLN
2 | OROMUCOSAL | Status: AC
Start: 2023-11-14 — End: 2023-11-14

## 2023-11-14 MED ORDER — MIDAZOLAM HCL 2 MG/2ML IJ SOLN
2 | INTRAMUSCULAR | Status: AC
Start: 2023-11-14 — End: 2023-11-14

## 2023-11-14 MED ORDER — LIDOCAINE VISCOUS HCL 2 % MT SOLN
2 | OROMUCOSAL | Status: AC | PRN
Start: 2023-11-14 — End: 2023-11-14
  Administered 2023-11-14: 15:00:00 15 via OROMUCOSAL

## 2023-11-14 MED ORDER — FENTANYL CITRATE (PF) 100 MCG/2ML IJ SOLN
100 | INTRAMUSCULAR | Status: AC | PRN
Start: 2023-11-14 — End: 2023-11-14
  Administered 2023-11-14: 15:00:00 25 via INTRAVENOUS

## 2023-11-14 MED FILL — NALOXONE HCL 0.4 MG/ML IJ SOLN: 0.4 mg/mL | INTRAMUSCULAR | Qty: 1

## 2023-11-14 MED FILL — FLUMAZENIL 0.5 MG/5ML IV SOLN: 0.5 MG/5ML | INTRAVENOUS | Qty: 5

## 2023-11-14 MED FILL — MIDAZOLAM HCL 2 MG/2ML IJ SOLN: 2 mg/mL | INTRAMUSCULAR | Qty: 2

## 2023-11-14 MED FILL — LIDOCAINE VISCOUS HCL 2 % MT SOLN: 2 % | OROMUCOSAL | Qty: 15

## 2023-11-14 MED FILL — FENTANYL CITRATE (PF) 100 MCG/2ML IJ SOLN: 100 MCG/2ML | INTRAMUSCULAR | Qty: 2

## 2023-11-15 LAB — TEE (PRN CONTRAST/BUBBLE/3D): Body Surface Area: 1.9 m2

## 2023-11-21 ENCOUNTER — Inpatient Hospital Stay
Admit: 2023-11-21 | Discharge: 2023-11-21 | Disposition: A | Discharge status: Home / Self Care | Payer: MEDICARE | Arrived: AM | Attending: Emergency Medicine

## 2023-11-21 DIAGNOSIS — H6121 Impacted cerumen, right ear: Principal | ICD-10-CM

## 2023-11-21 MED ORDER — CARBAMIDE PEROXIDE 6.5 % OT SOLN
6.5 | Freq: Two times a day (BID) | OTIC | 0 refills | Status: AC
Start: 2023-11-21 — End: 2023-12-21

## 2023-11-21 MED ORDER — CARBAMIDE PEROXIDE 6.5 % OT SOLN
6.5 | Freq: Once | OTIC | Status: AC
Start: 2023-11-21 — End: 2023-11-21
  Administered 2023-11-21: 08:00:00 5 [drp] via OTIC

## 2023-11-21 MED FILL — MURINE EAR 6.5 % OT SOLN: 6.5 % | OTIC | Qty: 15

## 2023-11-21 NOTE — ED Provider Notes (Signed)
 " [facility] REGIONAL EMERGENCY DEPARTMENT  EMERGENCY DEPARTMENT ENCOUNTER      Pt Name: Dalton Warner  MRN: [id number]  Birthdate 1930/08/08  Date of evaluation: 11/21/2023  Provider: Benjiman Nelwyn Blander, MD    CHIEF COMPLAINT       Chief Complaint   Patient presents with    Ear Pain    Foreign Body in Ear     Right          HISTORY OF PRESENT ILLNESS      Dalton Warner is a 89 y.o. male who presents to the emergency department  for   Chief Complaint   Patient presents with    Ear Pain    Foreign Body in Ear     Right        89 year old male presents reporting foreign body sensation in right ear as well as some pain in the right ear.  He does wear hearing aids.  He states that several days ago he had little bit of brown discharge coming from his right ear.  It was removed after he had showered and there have been a little bit of warm water in the ear.  He states his evening he was awoken from sleep with this pain in his right ear.  Denies any more discharge from the ear.  Denies any fever, chills or other constitutional infect symptoms.  Reports has not been able to get his hearing aid into the right ear          Nursing Notes, Triage Notes & Vital Signs were reviewed.      REVIEW OF SYSTEMS    (2-9 systems for level 4, 10 or more for level 5)     Review of Systems   HENT:  Positive for ear pain.        Except as noted above the remainder of the review of systems was reviewed and negative.       PAST MEDICAL HISTORY     Past Medical History:   Diagnosis Date    Arthritis     Atrial fibrillation [facility])     Bleeding ulcer 08/2018    Cancer [facility])     Prostate cancer dx March 2010- tx with radiation     Colon cancer [facility]) 2000    per old chart dx with colon ca - tx with surgery only    Deficient knowledge of transesophageal echocardiography (TEE) 08/15/2023    Diabetes mellitus (HCC)     was borderline- at one time was on Metformin for several yrs-  when I went to [facility] they had me stop this in  2017    DVT (deep venous thrombosis) (HCC) 2015    States DVT 2014 and 2015    Fracture     had broken neck in Nov 2016- wore a collar for 6 weeks only    HOH (hard of hearing)     bil hearing aide    Hx of blood clots     had blood clot - it was a  blood clot in  my esophagus     Hx of Doppler ultrasound 09/19/2019    EF IS 55-60%  Bi atrial enlargement noted.  Moderate mitral, aortic, tricuspid and mild pulmonic regurgitation is present    Hx of fall     last fall 2016- use cane walker and have electric chair    Hyperlipidemia     Hypertension     follow with Dr Orvel  at [facility]    Limited range of motion (ROM) of shoulder     right shoulder haved 10 % usage and left shoulder 60 % of usage    Neuropathy     both feet    Sleep apnea     had sleep study- tried to give me cpap but could not tolerate it     TIA (transient ischemic attack)     'in Jan 2009- trouble with speech, numbness of lip, weakness right arm - all only lasted for 20 seconds    Unsteady gait     hx of back problem- was on steroids for 4 yrs- since then stability issues with my back -     Wears glasses     to read        Prior to Admission medications   Medication Sig Start Date End Date Taking? Authorizing Provider   carbamide peroxide (DEBROX) 6.5 % otic solution Place 5 drops in ear(s) 2 times daily 11/21/23 12/21/23 Yes Russell Goman, Chelby Salata, MD   clopidogrel  (PLAVIX ) 75 MG tablet Take 1 tablet by mouth daily 10/19/23   Axel Annabella RAMAN, APRN - CNP   aspirin  81 MG EC tablet Take 1 tablet by mouth daily 09/23/23   Moishe Bruckner T, PA-C   fluorouracil (EFUDEX) 5 % cream  07/28/23   [provider]   atorvastatin  (LIPITOR ) 40 MG tablet Take 1 tablet by mouth daily 01/13/23   Bertell Clarine Nurse, MD   metoprolol  succinate (TOPROL  XL) 50 MG extended release tablet Take 1 tablet by mouth nightly 01/13/23   Alam, Mian Bilal, MD   SYSTANE 0.4-0.3 % ophthalmic solution  06/08/22   [provider]   ketoconazole (NIZORAL) 2  % shampoo  01/07/20   [provider]   ketoconazole (NIZORAL) 2 % cream  01/07/20   [provider]   ammonium lactate (AMLACTIN) 12 % cream  08/27/19   [provider]   cetirizine  (ZYRTEC ) 10 MG tablet Take 1 tablet by mouth daily    [provider]   pantoprazole  (PROTONIX ) 40 MG tablet Take 1 tablet by mouth 2 times daily (before meals) 03/05/19   Eziokwu, Akaolisa, MD   guaiFENesin  (MUCINEX ) 600 MG extended release tablet Take 2 tablets by mouth 2 times daily    [provider]   alfuzosin (UROXATRAL) 10 MG extended release tablet Take 1 tablet by mouth daily    [provider]   albuterol  sulfate (PROAIR  RESPICLICK) 108 (90 Base) MCG/ACT aerosol powder inhalation 2 puffs every 6 hours as needed    [provider]   montelukast  (SINGULAIR ) 10 MG tablet 1 tablet nightly 01/09/18   [provider]        Patient Active Problem List   Diagnosis    Thrombocytopenia    History of arthroplasty of right shoulder    Severe anemia    Anemia    Essential hypertension    Atrial fibrillation (HCC)    Monoclonal gammopathy    SSS (sick sinus syndrome) (HCC)    Dyslipidemia    Mural thrombus of left ventricle    Pacemaker    Paroxysmal atrial fibrillation (HCC)    Presence of Watchman left atrial appendage closure device         SURGICAL HISTORY       Past Surgical History:   Procedure Laterality Date    ABDOMEN SURGERY  2000    per old chart had right hemicolectomy removed 2 and  a half feet of colon and my appendix    COLONOSCOPY  2019    CT BIOPSY BONE MARROW  11/02/2016    CT BONE MARROW BIOPSY 11/02/2016    ENDOSCOPY, COLON, DIAGNOSTIC      per old chart had EGD done 08/2018 and 09/2018    EP DEVICE PROCEDURE N/A 09/22/2023    Watchman laa closure device performed by Cosette Selestine Rank, MD at [facility] CARDIAC CATH LAB    HERNIA REPAIR      per old chart open left ing hernia 1980 and then recurrent left ing hernia repair 08/2018    JOINT REPLACEMENT      per  old chart total left knee 2018    PACEMAKER PLACEMENT      per old chart hx of st jude pacer insertion 2008- new pacer inserted 2019    SHOULDER SURGERY Left 2015    rota. cuff    SHOULDER SURGERY Right 02/27/2019    RIGHT REVERSE SHOULDER TOTAL ARTHROPLASTY REVERSE performed by Chauncey Gladis Hummer, MD at [facility] OR         CURRENT MEDICATIONS       Previous Medications    ALBUTEROL  SULFATE (PROAIR  RESPICLICK) 108 (90 BASE) MCG/ACT AEROSOL POWDER INHALATION    2 puffs every 6 hours as needed    ALFUZOSIN (UROXATRAL) 10 MG EXTENDED RELEASE TABLET    Take 1 tablet by mouth daily    AMMONIUM LACTATE (AMLACTIN) 12 % CREAM        ASPIRIN  81 MG EC TABLET    Take 1 tablet by mouth daily    ATORVASTATIN  (LIPITOR ) 40 MG TABLET    Take 1 tablet by mouth daily    CETIRIZINE  (ZYRTEC ) 10 MG TABLET    Take 1 tablet by mouth daily    CLOPIDOGREL  (PLAVIX ) 75 MG TABLET    Take 1 tablet by mouth daily    FLUOROURACIL (EFUDEX) 5 % CREAM        GUAIFENESIN  (MUCINEX ) 600 MG EXTENDED RELEASE TABLET    Take 2 tablets by mouth 2 times daily    KETOCONAZOLE (NIZORAL) 2 % CREAM        KETOCONAZOLE (NIZORAL) 2 % SHAMPOO        METOPROLOL  SUCCINATE (TOPROL  XL) 50 MG EXTENDED RELEASE TABLET    Take 1 tablet by mouth nightly    MONTELUKAST  (SINGULAIR ) 10 MG TABLET    1 tablet nightly    PANTOPRAZOLE  (PROTONIX ) 40 MG TABLET    Take 1 tablet by mouth 2 times daily (before meals)    SYSTANE 0.4-0.3 % OPHTHALMIC SOLUTION           ALLERGIES     Cat dander, Dust mite extract, Other, and Fish allergy    FAMILY HISTORY       Family History   Problem Relation Age of Onset    Cancer Mother         lung cancer    Diabetes Mother     Diabetes Sister     Cancer Brother     High Blood Pressure Brother           SOCIAL HISTORY       Social History     Socioeconomic History    Marital status: Widowed     Spouse name: None    Number of children: None    Years of education: None    Highest education level: None   Tobacco Use    Smoking status: Never  Smokeless  tobacco: Never   Vaping Use    Vaping status: Never Used   Substance and Sexual Activity    Alcohol use: Not Currently     Comment: maybe 6 times per year when had prostate cancer did drink one glass red wine daily in the past  1cup coffee    Drug use: Never     Social Drivers of Health     Transportation Needs: No Transportation Needs (09/23/2023)    PRAPARE - Therapist, Art (Medical): No     Lack of Transportation (Non-Medical): No   Housing Stability: Low Risk  (09/23/2023)    Housing Stability Vital Sign     Unable to Pay for Housing in the Last Year: No     Number of Times Moved in the Last Year: 0     Homeless in the Last Year: No       SCREENINGS    Glasgow Coma Scale  Eye Opening: Spontaneous  Best Verbal Response: Oriented  Best Motor Response: Obeys commands  Glasgow Coma Scale Score: 15          PHYSICAL EXAM    (up to 7 for level 4, 8 or more for level 5)     ED Triage Vitals [11/21/23 0347]   BP Girls Systolic BP Percentile Girls Diastolic BP Percentile Boys Systolic BP Percentile Boys Diastolic BP Percentile Temp Temp Source Pulse   (!) 147/87 -- -- -- -- 98.2 F (36.8 C) Oral 64      Respirations SpO2 Height Weight       18 99 % -- --           Physical Exam  Vitals reviewed.   HENT:      Head: Normocephalic and atraumatic.      Left Ear: Tympanic membrane normal.      Ears:      Comments: .  Right ear cerumen impaction     Nose: Nose normal.      Mouth/Throat:      Mouth: Mucous membranes are moist.   Eyes:      Extraocular Movements: Extraocular movements intact.      Pupils: Pupils are equal, round, and reactive to light.   Cardiovascular:      Rate and Rhythm: Normal rate.   Pulmonary:      Effort: Pulmonary effort is normal.   Abdominal:      Palpations: Abdomen is soft.      Tenderness: There is no guarding.   Musculoskeletal:         General: Normal range of motion.      Cervical back: Normal range of motion and neck supple.   Skin:     General: Skin is warm.       Capillary Refill: Capillary refill takes less than 2 seconds.   Neurological:      General: No focal deficit present.      Mental Status: He is alert.         DIAGNOSTIC RESULTS     Labs Reviewed - No data to display     RADIOLOGY:     Non-plain film images such as CT, Ultrasound and MRI are read by the radiologist. Plain radiographic images are visualized and preliminarily interpreted by the emergency physician.       Interpretation per the Radiologist below, if available at the time of this note:    No orders to display  ED BEDSIDE ULTRASOUND:   Performed by ED Physician Benjiman Nelwyn Blander, MD       LABS:  Labs Reviewed - No data to display    All other labs were within normal range or not returned as of this dictation.    EMERGENCY DEPARTMENT COURSE and DIFFERENTIAL DIAGNOSIS/MDM:   Vitals:    Vitals:    11/21/23 0347   BP: (!) 147/87   Pulse: 64   Resp: 18   Temp: 98.2 F (36.8 C)   TempSrc: Oral   SpO2: 99%           MDM  Number of Diagnoses or Management Options  Impacted cerumen of right ear  Diagnosis management comments: 89 year old male presents reporting foreign body sensation in right ear as well as some pain in the right ear.  He does wear hearing aids.  He states that several days ago he had little bit of brown discharge coming from his right ear.  It was removed after he had showered and there have been a little bit of warm water in the ear.  He states his evening he was awoken from sleep with this pain in his right ear.  Denies any more discharge from the ear.  Denies any fever, chills or other constitutional infect symptoms.  Reports has not been able to get his hearing aid into the right ear    He presents afebrile.  No tachycardia.  Respirations are within normal limits.  Saturations are in the high 90s on room air.    On exam does have some cerumen impaction in the right ear.  Ear canal appears unremarkable.    Were able to remove some of the earwax using a curette.  Debrox was placed in  the ear.    He will continue with Debrox at home and some irrigation at home to help remove the remainder of the cerumen.    He is appropriate for outpatient management.    He was treated with Debrox in the emergency department and will be prescribed for home.    He is discharged, ambulatory, in stable condition, strict return precaution        -  Patient seen and evaluated in the emergency department.  -  Triage and nursing notes reviewed and incorporated.  -  Old chart records reviewed and incorporated.  -  Work-up included:  See above  -  Results discussed with patient.      REASSESSMENT        11/21/23 4:08 AM EDT I have completed a reassessment     CRITICAL CARE TIME     This excludes seperately billable procedures and family discussion time. Critical care time provided for obtaining history, conducting a physical exam, performing and monitoring interventions, ordering, collecting and interpreting tests, and establishing medical decision-making.  There was a potential for life/limb threatening pathology requiring close evaluation and intervention with concern for patient decompensation.    CONSULTS:  None    PROCEDURES:  None performed unless otherwise noted below     Procedures        FINAL IMPRESSION      1. Impacted cerumen of right ear          DISPOSITION/PLAN   DISPOSITION Discharge - Pending Orders Complete 11/21/2023 04:10:29 AM      PATIENT REFERRED TO:  Verdie Delon [name], APRN - CNP  [address]  [place] [address]  [phone]  DISCHARGE MEDICATIONS:  New Prescriptions    CARBAMIDE PEROXIDE (DEBROX) 6.5 % OTIC SOLUTION    Place 5 drops in ear(s) 2 times daily       ED Provider Disposition Time  DISPOSITION Discharge - Pending Orders Complete 11/21/2023 04:10:29 AM   DISPOSITION CONDITION Stable           Appropriate personal protective equipment was worn during the patient's evaluation.  These included surgical, eye protection, surgical mask or in 95 respirator and gloves.   The patient was also placed in a surgical mask for the prevention of possible spread of respiratory viral illnesses.    The Patient was instructed to read the package inserts with any medication that was prescribed.  Major potential reactions and medication interactions were discussed.  The Patient understands that there are numerous possible adverse reactions not covered.    The patient was also instructed to arrange follow-up with his or her primary care provider for review of any pending labwork or incidental findings on any radiology results that were obtained.  All efforts were made to discuss any incidental findings that require further monitoring.      Controlled Substances Monitoring:          No data to display                (Please note that portions of this note were completed with a voice recognition program.  Efforts were made to edit the dictations but occasionally words are mis-transcribed.)    Toyna Erisman Russell Goman, MD (electronically signed)  Attending Emergency Physician           Nelwyn Blander, Harshal Sirmon, MD  11/21/23 [phone]    "

## 2023-11-21 NOTE — Discharge Instructions (Addendum)
"  Please continue to use debrox at home to help remove eawax.    Instructions: tilt your head and place 5 to 10 drops into the ear canal, keeping your head tilted for several minutes to allow the microfoam to soften and loosen earwax. Use twice a day for up to four days, and after the last treatment, gently flush the ear with warm water using a soft rubber bulb syringe to remove remaining wax.      If you develop any worsening or concerning symptoms, please seek immediate medical evaluation  "

## 2023-11-21 NOTE — ED Triage Notes (Addendum)
"  Pt to ed with CO  possible foreign body in ear. States something started going on Friday. Tonight around 1am he said something felt weird in his ear and attempted to put hearing aid in but couldn't. Attempted to clear ear with tissue and noticed brown discharge. Pt states its a 9/10 pain. States it was swollen Saturday but was gone by Sunday.  "

## 2023-12-07 ENCOUNTER — Encounter

## 2023-12-19 ENCOUNTER — Encounter: Payer: MEDICARE | Attending: Pulmonary Disease | Primary: Family

## 2023-12-22 MED ORDER — METOPROLOL SUCCINATE ER 50 MG PO TB24
50 | ORAL_TABLET | Freq: Every evening | ORAL | 1 refills | 90.00000 days | Status: AC
Start: 2023-12-22 — End: ?

## 2023-12-28 ENCOUNTER — Ambulatory Visit: Admit: 2023-12-28 | Discharge: 2023-12-28 | Payer: MEDICARE | Attending: Internal Medicine | Primary: Family

## 2023-12-28 ENCOUNTER — Inpatient Hospital Stay: Admit: 2023-12-28 | Discharge: 2023-12-28 | Payer: MEDICARE | Primary: Family

## 2023-12-28 ENCOUNTER — Inpatient Hospital Stay: Payer: MEDICARE | Primary: Family

## 2023-12-28 VITALS — BP 124/79 | HR 75 | Temp 98.20000°F | Ht 67.0 in | Wt 165.0 lb

## 2023-12-28 DIAGNOSIS — D472 Monoclonal gammopathy: Principal | ICD-10-CM

## 2023-12-28 DIAGNOSIS — C189 Malignant neoplasm of colon, unspecified: Principal | ICD-10-CM

## 2023-12-28 LAB — VITAMIN B12 & FOLATE: Folate: 8 ng/mL (ref 4.8–24.2)

## 2023-12-28 LAB — COMPREHENSIVE METABOLIC PANEL
ALT: 18 U/L (ref 10–40)
Albumin: 4.3 g/dL (ref 3.4–5.0)
Alkaline Phosphatase: 78 U/L (ref 40–129)
Anion Gap: 14 mmol/L (ref 9–17)
BUN: 15 mg/dL (ref 7–20)
CO2: 21 mmol/L (ref 21–32)
Chloride: 97 mmol/L — ABNORMAL LOW (ref 99–110)
Creatinine: 0.9 mg/dL (ref 0.8–1.3)
Est, Glom Filt Rate: 78 mL/min/1.73m2 (ref >60)
Sodium: 132 mmol/L — ABNORMAL LOW (ref 136–145)
Total Bilirubin: 0.7 mg/dL (ref 0.0–1.0)
Total Protein: 6.6 g/dL (ref 6.4–8.2)

## 2023-12-28 LAB — CBC WITH AUTO DIFFERENTIAL
Basophils Absolute: 0 k/uL
Eosinophils Absolute: 0.16 k/uL
Hematocrit: 30.3 % — ABNORMAL LOW (ref 42.0–52.0)
Lymphocytes %: 16 % — ABNORMAL LOW (ref 24–44)
MCHC: 33 g/dL (ref 32.0–36.0)
MCV: 83.5 fL (ref 78.0–100.0)
Monocytes Absolute: 1.15 k/uL
Platelet Estimate: NORMAL
RBC Morphology: NORMAL
WBC Morphology: NORMAL

## 2023-12-28 LAB — IRON AND TIBC: Iron % Saturation: 32 % (ref 15–50)

## 2023-12-28 LAB — FERRITIN: Ferritin: 114 ng/mL (ref 30–400)

## 2023-12-28 NOTE — Progress Notes (Signed)
"  MA/LPN Rooming Questions  Patient: Dalton Warner  MRN: [id number]    Date: 12/28/2023             1. Do you have any new issues?   no    2. Do you need any refills on medications?    no    3. Have you had any imaging done since your last visit?   yes - Xray 10/3    4. Have you been hospitalized or seen in the emergency room since your last visit here?   yes - ER 10/13 for ear pain    5. Did the patient have a depression screening completed today? No    No data recorded     PHQ-9 Given to (if applicable):               PHQ-9 Score (if applicable):                     []  Positive     []   Negative              Does question #9 need addressed (if applicable)                     []  Yes    []   No               Mattie Robinette, MA      "

## 2023-12-28 NOTE — Progress Notes (Signed)
 "Patient Name:  Dalton Warner  Patient DOB:  November 21, 1930  Patient MRN:  [id number]     Primary Oncologist: Renaye Him, MD  Referring Provider: Verdie Nest, APRN - CNP     Date of Service: 12/28/2023      Reason for Consult:  Thrombocytopenia     Chief Complaint:    Chief Complaint   Patient presents with    Follow-up         Encounter Diagnoses   Name Primary?    Carcinoma of colon (HCC) Yes    Myelodysplastic syndrome, unspecified (HCC)     Prostate CA (HCC)           HPI:   11/22/18: Arrived alone to the clinic today. Loves to talk.  According to Dr. Shelton his oncologist at [facility] he was diagnosed with prostate cancer in 2010.  Received external beam radiation therapy.  He has been on Xarelto  since 2017 for A. fib.  Prior to which he was on Coumadin since 2008.  Further work-up was negative for hepatitis B, hepatitis C and HIV.  No evidence of hemolysis.  B12 was on lower side.  Folate was normal.  Iron studies were normal.  Serum protein electrophoresis was normal.  But imaging of fixation revealed a small IgG lambda monoclonal band.  Was advised to be on B12 supplements.  Today at the office he reported that he had hernia surgery on July 24 when he received platelet transfusion.  Reported that his been caregiver to his wife for 20 years and is currently at [facility] home at the other mass unit.  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  Married for 67 years.  Lost a lot of weight in the process of taking care of his wife.  Reported tiredness.  No bleeding or any rash.  No chest pain.  Reported arthritis pain all the time.  Reported that he is taking oral B12 supplements thousand micrograms daily.    11/02/2016: AFA:Fnmeynonhpr review demonstrates a bone marrow that is approximately 30% cellular with trilineage hematopoiesis and 1-2% blasts based on the manual differential count, as confirmed by immunophenotypic studies. There are mild myeloid dyspoietic  changes as well as minimal erythroid atypia. Although reactive etiologies including medications, toxins and nutritional deficiencies must be excluded, the patient's cytopenias and bone marrow morphologic findings raise the possibility of an underlying myelodysplastic syndrome.  FISH/Cytogenetics: Normal    07/20/2018 CBC with WBC of 6.52 hemoglobin of 11.0 hematocrit 34.7 MCV of 93.3 platelets of 78 diff within normal limits    March 2020  platelet count was 81.    Jan 2020  it was 85K    09/2018: CT chest:1. Ectasia of the ascending thoracic aorta measuring 4.2 cm in short-axis diameter.  This may be due to aortic stenosis considering the aortic valve calcifications present.  2. No evidence of aortic dissection.  3. Atherosclerotic calcification of the 3 major native coronary arteries.  4. Moderate dilation of cardiac chambers.  5. Evidence of prior granulomatous disease with no definite pneumonia or suspicious-appearing pulmonary nodules.  6. Trace right pleural effusion.  7. Mild fatty infiltration of the liver.  8. Small sliding-type hiatal hernia.    09/08/18: Ultrasound:1. Mild ascites evident in the left upper quadrant.  2. Nonvisualized pancreas and majority of the abdominal aorta due to overlying bowel gas.  3. Otherwise unremarkable study including no evidence of cirrhosis and normal splenic size measuring 9.6  cm.    12/29/18: Ct shoulder:  1. Interstitial tearing of the subscapularis, supraspinatus, and   infraspinatus tendons with full-thickness tearing predominantly along the   conjoined fibers of the supraspinatus and infraspinatus tendons which   measures approximately 1.0 x 1.4 cm.  There is also full-thickness   perforation of the anterior fibers of the supraspinatus tendon.  Underlying   diffuse mild atrophy of the rotator cuff musculature.   2. Severe advanced osteoarthritis of the glenohumeral joint with underlying   diffuse complex labral tearing.   3. Severe osteoarthritis of the acromioclavicular  joint.   4. Synovitis.     Feb 27 2019:Right rotator cuff surgery. Lost lot of blood and received 3 units of PRBCs.     Feb 27 2019:  Unremarkable appearance of reverse glenohumeral arthroplasty.       AC joint osteoarthritis.         01/13/20:Presented to [facility] with sx of fatigue, Acute bronchitis, reflux sx, hans and fett feeling cold. generalized arthritis pains. No overt bleeding. No bruising. Palpitations. No pain, increased sob. No dizziness.      Feb 2022 CXR normal     03/10/2021 vitamin B12 841.1  CBC WBC 6.7 hemoglobin 11.3 hematocrit 35.3 MCV 88.5 platelets 85  CMP basically WNL  Ferritin 229  Folate 10.9  IgA 118  IgG 982  IgM 66    Electrophoresis protein: restricted band of protein migration in the gamma region which is too small to quantify. IFE gel shows a faint band in lambda which may be indicative of a specific immune response or an early monoclonal protein.     03/26/2021 CT abdomen pelvis  Impression   1. No acute intraabdominal process identified.   2. Colonic diverticulosis without CT evidence of diverticulitis.   3. Severe atherosclerotic disease.   CBC WBC 8.3 hemoglobin 11.4 hematocrit 35.0 MCV 87.3 platelets 84  CMP BUN 14 creatinine 0.8  Lipase 58  Magnesium  2.0    June 15, 2023B12 906 CBC with WBC of 8.6 hemoglobin of 11.1 hematocrit 35.5 MCV of 87.7 and platelets of 93.  CMP basically within normal limits ferritin 229 serum protein electrophoresis with a restricted band of protein migration in the gamma region which is too small to quantify.  Iron saturations of 34% folic acid 8.9    04/19/2022, B12 736 CBC with WBC of 7.5 hemoglobin of 10.7 hematocrit 33.2 and platelets of 65 K CMP with creatinine 1.2 total bilirubin of 1.2 ferritin of 322 folic acid 6.7    07/29/2022 B12 more than 2000, CBC WBC 7.1 hemoglobin of 11.1 hematocrit 35.3 MCV of 87.2 and platelets of 86 KCMP within normal limits ferritin 192 folic acid 8.6 reticulocyte count of 1.8 iron saturations of 34% and sed rate less than  1    05/06/2023 B12 426CBC with WBC of 6.4 hemoglobin of 11 hematocrit 33.9 MCV of 84.8 and platelets of 82 CMP with sodium of 135 potassium of 4.7 BUN of 17 creatinine 1 LFTs within normal limits iron saturations of 30% ferritin of 109 folate of 7.9 serum protein electrophoresis with faint IgG kappa    09/19/2023 chest x-ray no acute disease    09/26/2023 CT lower extremity:IMPRESSION:     VASCULAR:     1.  No evidence of acute vascular injury, pseudoaneurysm or dissection. Small   superficial hematoma (1.4 x 0.8 cm) in the right groin and some soft tissue fat   stranding (edema versus cellulitis) in the subcutaneous right groin and  prepubic   region are noted.     2.  Patent three-vessel runoff in the right lower extremity. Possible small   focal area of significant stenosis or occlusion distal posterior tibial artery   at the level of the lower ankle, however assessment limited by small caliber of   the vessel and vessel wall calcifications.     NONVASCULAR:     1.  Nonspecific small amount of free fluid in the pelvis.     2.  Small right knee joint effusion.    11/11/2023 x-ray of the scapula right:      11/19/2025CBC with WBC of 8.2 hemoglobin of 10 hematocrit of 30.3 MCV of 83 and platelets of 95, absolute neutrophil count of 5500 and absolute monocyte count of 1115.  CMP, ferritin, iron saturations, B12, electrophoresis pending      Past Medical History:     A. fib, osteoarthritis, borderline diabetes.  Seasonal allergies, prostate cancer hypertension.                                                           Past Surgery History:      Basal cell skin cancer removal, cardiac pacemaker, colectomy for diverticular disease, hernia repair, left knee replacement, appendectomy                                                              Social History:   Lives alone.  Has 3 children, they live in [place]  in [place] .  Retired as haematologist, [employer] position.  Denies any smoking history or any other illicit  drug abuse.                                                                                                    Family History:    Mother was a non-smoker was diagnosed with lung cancer.  Younger brother with colon cancer.  His sister was diagnosed with uterine, ovarian cancer at the age of 51.                                                                                           Allergies   Allergen Reactions    Cat Dander Other (See Comments)     Runny nose    Dust Mite Extract Other (See Comments)  Runny nose    Other      Other reaction(s): Other (See Comments)  Dust/cats - sneezing    Fish Allergy Nausea And Vomiting     Salmon only  Salmon only       Current Outpatient Medications on File Prior to Visit   Medication Sig Dispense Refill    metoprolol  succinate (TOPROL  XL) 50 MG extended release tablet Take 1 tablet by mouth nightly 90 tablet 1    ciprofloxacin-dexAMETHasone  (CIPRODEX) 0.3-0.1 % otic suspension INSTILL 1 DROP INTO THE RIGHT EAR IN THE MORNING AND AT BEDTIME      olopatadine (PATANOL) 0.1 % ophthalmic solution Apply to eye      Humidifiers (COOL MIST HUMIDIFIER) MISC 1 each by Does not apply route daily as needed use daily at night      triamcinolone (KENALOG) 0.1 % cream Apply topically as needed (for rash) Apply topically 2 times daily.      fluticasone (FLONASE) 50 MCG/ACT nasal spray 1 spray by Each Nostril route daily as needed for Rhinitis      acetaminophen  (TYLENOL ) 500 MG tablet Take 1 tablet by mouth every 6 hours as needed for Pain      clopidogrel  (PLAVIX ) 75 MG tablet Take 1 tablet by mouth daily 90 tablet 1    aspirin  81 MG EC tablet Take 1 tablet by mouth daily 90 tablet 1    fluorouracil (EFUDEX) 5 % cream       atorvastatin  (LIPITOR ) 40 MG tablet Take 1 tablet by mouth daily 30 tablet 5    SYSTANE 0.4-0.3 % ophthalmic solution       ketoconazole (NIZORAL) 2 % shampoo       ketoconazole (NIZORAL) 2 % cream       ammonium lactate (AMLACTIN) 12 % cream       cetirizine  (ZYRTEC )  10 MG tablet Take 1 tablet by mouth daily      pantoprazole  (PROTONIX ) 40 MG tablet Take 1 tablet by mouth 2 times daily (before meals) 60 tablet 2    guaiFENesin  (MUCINEX ) 600 MG extended release tablet Take 2 tablets by mouth 2 times daily      alfuzosin (UROXATRAL) 10 MG extended release tablet Take 1 tablet by mouth daily      albuterol  sulfate (PROAIR  RESPICLICK) 108 (90 Base) MCG/ACT aerosol powder inhalation 2 puffs every 6 hours as needed      montelukast  (SINGULAIR ) 10 MG tablet 1 tablet nightly       No current facility-administered medications on file prior to visit.     Interval history:12/28/23: Alone here here to clinic alone using walker.  Reported that he has been tired but overall feels fine. Happy all the time.   Denies any bleeding.   No pain.  No weight loss.  Appetite is okay.  Denying chest pain, abdominal pain, lower extremity edema.  Balance not normal but has been chronic. Evaluation in the past  with no obvious cause and treatment. Arthritis multiple joints. Left shoulder joint decreased mobility but better now.    Review of Systems:    As per the interval history, otherwise rest of the ros negative     Vital Signs: BP 124/79 (BP Site: Left Upper Arm, Patient Position: Sitting, BP Cuff Size: Medium Adult)   Pulse 75   Temp 98.2 F (36.8 C) (Infrared)   Ht 1.702 m (5' 7)   Wt 74.8 kg (165 lb)   SpO2 98%   BMI 25.84 kg/m  Physical Exam:  CONSTITUTIONAL: awake, alert ,Uses walker for ambulation. HOH  EYES:PEAR, no palor or any icetrus  ZWU:JUWR  NECK: No JVD  HEMATOLOGIC/LYMPHATIC: no cervical, supraclavicular or axillary lymphadenopathy   LUNGS: coarse bs left side   CARDIOVASCULAR: s1s2 irr irr ESM  ABDOMEN: soft ntnd bs pos  NEUROLOGIC: GI  SKIN: No rash  EXTREMITIES: no LE edema bilaterally.      Labs:  Hematology:  Lab Results   Component Value Date    WBC 8.2 12/28/2023    RBC 3.63 (L) 12/28/2023    HGB 10.0 (L) 12/28/2023    HCT 30.3 (L) 12/28/2023    MCV 83.5 12/28/2023     MCH 27.5 12/28/2023    MCHC 33.0 12/28/2023    RDW 21.7 (H) 12/28/2023    PLT 95 (L) 12/28/2023    MPV 10.5 12/28/2023    BANDSPCT 3 (L) 04/19/2022    BASOPCT 0 12/28/2023    LYMPHOPCT 16 (L) 12/28/2023    MONOPCT 14 (H) 12/28/2023    BANDABS 0.23 04/19/2022    EOSABS 0.16 12/28/2023    BASOSABS 0.00 12/28/2023    LYMPHSABS 1.31 12/28/2023    MONOSABS 1.15 12/28/2023    DIFFTYPE  07/29/2022     AUTOMATED DIFF RESULTS CONFIRMED BY SMEAR REVIEW  AUTOMATED DIFFERENTIAL      ANISOCYTOSIS 1+ 07/29/2022    WBCMORP Normal 12/28/2023     No results found for: ESR    Chemistry:  Lab Results   Component Value Date    NA 129 (L) 09/26/2023    K 4.1 09/26/2023    CL 97 (L) 09/26/2023    CO2 22 09/26/2023    BUN 18 09/26/2023    CREATININE 0.9 09/26/2023    GLUCOSE 110 (H) 09/26/2023    CALCIUM  8.8 09/26/2023    BILITOT 0.7 05/06/2023    ALKPHOS 71 05/06/2023    AST 20 05/06/2023    ALT 15 05/06/2023    LABGLOM 76 09/26/2023    GFRAA >60 09/29/2020    GFRAA >60 09/29/2020    PHOS 3.5 09/19/2023    MG 2.0 09/19/2023    POCGLU 107 (H) 09/22/2023     Lab Results   Component Value Date    LDH 170 05/24/2019     No results found for: LD  Lab Results   Component Value Date    TSHHS 2.770 02/26/2021     Immunology:  Lab Results   Component Value Date    SPEP  04/01/2020     INTERPRETATION - A faint free lambda light chains monoclonal band is detected. SAF    SPEP  04/01/2020     INTERPRETATION - Concurrent immunofixation detected a faint free lambda light chains monoclonal band, which is too small to measure. SAF    ALBUMINELP 3.6 04/01/2020    GAMGLOB 0.9 04/01/2020     No results found for: KAPPAUVOL, LAMBDAUVOL, KLFLCR  No results found for: B2M  Coagulation Panel:  Lab Results   Component Value Date    PROTIME 15.2 (H) 09/26/2023    INR 1.1 09/26/2023    APTT 36.2 09/26/2023     Anemia Panel:  Lab Results   Component Value Date    VITAMINB12 426 05/06/2023    FOLATE 7.9 05/06/2023     Tumor Markers:  Lab Results    Component Value Date    PSA 2.1 08/20/2022        Observations:  ECOG:  No data recorded  Assessment & Plan:                                                          Chronic thrombocytopenia: Etiology could be secondary to underlying MDS vs ITP.  Cytogenetics in the past were normal, blast being 2%, his IPSS score was about 1.5 which is a low score. No evidence of hemolysis, hepatitis, HIV in the past.  June 2025 platelet count remains relatively stable at 82K  Continue to monitor for now and discussed further evaluation including bone marrow biopsy but he defers at this point and I also dont think there is a compelling need.   Discussed the role of TPO agent in elective surgery.  November 2025, visit here, reported that he bleeds very easily but no overt bleeding.  Platelet count stable at 95.  Again discussed role of TPO agents.  To notify us  of any upcoming surgeries.    Normocytic anemia: Could be sec to early MDS? Hb stable around 11 and ferritin  and b12 not low. Continue to follow for now.   Defers further evaluation with bone marrow biopsy, aspiration which I am okay with.  Nov 2025, CBC with Hb 10, rest pending    Monoclonal gammopathy noted to have small IgG lambda band on the immunofixation but normal serum protein electrophoresis.  Normal renal function normal calcium  levels.  Serum immunoglobulins and serum free light chains were normal.  No evidence of any myeloma defining disease other than anemia which is probably not sec to that.  Repeat panel in June 2025with very faint M protein too small to measure.  Continue to monitor for now.  Nov 2025, myeloma panel pending    Paroxysmal atrial fibrillation.  Has history of TIA.  Has pacemaker.  Is on Eliquis  2.5 mg po bid.   Continue as long as a platelet count of more than 60 K.  Avoid falls, deep cuts and monitor closely for any bleeding.  Watchman on September 22 2023, he is currently on asa and plavix  currently    H/o Colon cancer: s/p resection in  2000, did not receive any adjuvant treatment. Also Family history of colon cancer.  Reported that he is up-to-date with the colonoscopy.    H/O Prostate cancer: Diagnosed in 2010. S/P XRT. Is being followed by Urology.     GERD: Pharmacological intervention not really helping but restricting diet has been helping. Has been tried on dexlansoprazole as well. Has been evaluated by GI    Family history ovarian cancer.  No genetic testing has been done and we discussed about that. He defered genetic testing at this point    Continue other medical care.    Discussed above findings and plan with him and he voiced understanding.  Answered all his questions.    Discussed healthy lifestyle including healthy diet, regular exercise as tolerated.  Also discussed importance of being up-to-date with age-appropriate screening tools. Colonoscopy looks like was done in 2019, but he said he would follow    Recommend follow-up with primary care physician and other specialists.    Please do not hesitate to contact us  if you need further information.    Return to clinic may 2026 or earlier if new symptoms.    This note is created with the assistance of a speech-recognition program.  While intending to generate a document that accurately reflects the content of the visit, no guarantee can be provided that every mistake has been identified and corrected by editing.    Time Spent with patient for face to face, exam, education, discussing treatment options, reviewing imaging, reviewing labs, decision making, Pre-charting, and documenting today's visit, 23 mins.     JC  "

## 2023-12-29 MED ORDER — CYANOCOBALAMIN 1000 MCG PO TABS
1000 | ORAL_TABLET | Freq: Every day | ORAL | 2 refills | Status: AC
Start: 2023-12-29 — End: ?

## 2023-12-29 NOTE — Progress Notes (Signed)
"  This nurse called the patient @ [phone] to review lab results. Patient was notified that the physician recommends for him to take B12 1000 mcg daily. Patient verbalized understanding and denies further needs at this time.    RX e-scribed to [facility]  "

## 2024-01-02 ENCOUNTER — Ambulatory Visit: Admit: 2024-01-02 | Discharge: 2024-01-02 | Payer: MEDICARE | Attending: Pulmonary Disease | Primary: Family

## 2024-01-02 VITALS — HR 78 | Resp 16 | Ht 67.0 in | Wt 165.0 lb

## 2024-01-02 DIAGNOSIS — J4530 Mild persistent asthma, uncomplicated: Principal | ICD-10-CM

## 2024-01-02 LAB — MISCELLANEOUS SENDOUT 4

## 2024-01-02 MED ORDER — ALBUTEROL SULFATE HFA 108 (90 BASE) MCG/ACT IN AERS
108 | Freq: Four times a day (QID) | RESPIRATORY_TRACT | 3 refills | 25.00000 days | Status: AC | PRN
Start: 2024-01-02 — End: ?

## 2024-01-02 NOTE — Progress Notes (Signed)
 "  Subjective:   CHIEF COMPLAINT / HPI:  doing well      Past Medical History:  Past Medical History:   Diagnosis Date    Allergies     Arthritis     Atrial fibrillation (HCC)     Bleeding ulcer 08/2018    BPH (benign prostatic hyperplasia)     Cancer [facility])     Prostate cancer dx March 2010- tx with radiation     Colon cancer [facility]) 2000    per old chart dx with colon ca - tx with surgery only    Deficient knowledge of transesophageal echocardiography (TEE) 08/15/2023    Diabetes mellitus (HCC)     was borderline- at one time was on Metformin for several yrs-  when I went to [facility] they had me stop this in 2017    DVT (deep venous thrombosis) (HCC) 2015    States DVT 2014 and 2015    Fatty liver     mild    Fracture     had broken neck in Nov 2016- wore a collar for 6 weeks only    Hemopericardium     History of ulcer disease     HOH (hard of hearing)     bil hearing aide    Hx of blood clots     had blood clot - it was a  blood clot in  my esophagus     Hx of Doppler ultrasound 09/19/2019    EF IS 55-60%  Bi atrial enlargement noted.  Moderate mitral, aortic, tricuspid and mild pulmonic regurgitation is present    Hx of fall     last fall 2016- use cane walker and have electric chair    Hyperlipidemia     Hypertension     follow with Dr Orvel at [facility]    Inguinal hernia     Limited range of motion (ROM) of shoulder     right shoulder haved 10 % usage and left shoulder 60 % of usage    Myelodysplastic syndrome (HCC)     Neuropathy     both feet    Obstructive sleep apnea     Pacemaker     Skin cancer     Sleep apnea     had sleep study- tried to give me cpap but could not tolerate it     Thrombocytopenia with anemia and myelofibrosis (HCC)     TIA (transient ischemic attack)     'in Jan 2009- trouble with speech, numbness of lip, weakness right arm - all only lasted for 20 seconds    Unsteady gait     hx of back problem- was on steroids for 4 yrs- since then stability issues with my back  -     Wears glasses     to read        Past Surgical History:        Procedure Laterality Date    ABDOMEN SURGERY  2000    per old chart had right hemicolectomy removed 2 and a half feet of colon and my appendix    COLONOSCOPY  2019    CT BIOPSY BONE MARROW  11/02/2016    CT BONE MARROW BIOPSY 11/02/2016    ENDOSCOPY, COLON, DIAGNOSTIC      per old chart had EGD done 08/2018 and 09/2018    EP DEVICE PROCEDURE N/A 09/22/2023    Watchman laa closure device performed by Cosette Selestine Rank, MD at [facility] CARDIAC CATH  LAB    HERNIA REPAIR      per old chart open left ing hernia 1980 and then recurrent left ing hernia repair 08/2018    JOINT REPLACEMENT      per old chart total left knee 2018    OTHER SURGICAL HISTORY  2025    Watchman Procedure    PACEMAKER PLACEMENT      per old chart hx of st jude pacer insertion 2008- new pacer inserted 2019    SHOULDER SURGERY Left 2015    rota. cuff    SHOULDER SURGERY Right 02/27/2019    RIGHT REVERSE SHOULDER TOTAL ARTHROPLASTY REVERSE performed by Chauncey Gladis Hummer, MD at [facility] OR       Current Medications:    No current facility-administered medications for this visit.    Allergies   Allergen Reactions    Cat Dander Other (See Comments)     Runny nose    Dust Mite Extract Other (See Comments)     Runny nose    Other      Other reaction(s): Other (See Comments)  Dust/cats - sneezing    Fish Allergy Nausea And Vomiting     Salmon only  Salmon only       Social History:    Social History     Socioeconomic History    Marital status: Widowed     Spouse name: None    Number of children: None    Years of education: None    Highest education level: None   Tobacco Use    Smoking status: Never    Smokeless tobacco: Never   Vaping Use    Vaping status: Never Used   Substance and Sexual Activity    Alcohol use: Not Currently     Comment: maybe 6 times per year when had prostate cancer did drink one glass red wine daily in the past  1cup coffee    Drug use: Never   Social History Narrative     Alcohol Use - 1/2 glass of wine daily    Diet - balanced Diet    Exercise - regular    Residence - lives alone    Do you believe your home environment is safe? Yes    Do you have a support system in place to assist w/ medical care? Yes      Social Drivers of Health     Transportation Needs: No Transportation Needs (09/23/2023)    PRAPARE - Therapist, Art (Medical): No     Lack of Transportation (Non-Medical): No   Housing Stability: Low Risk  (09/23/2023)    Housing Stability Vital Sign     Unable to Pay for Housing in the Last Year: No     Number of Times Moved in the Last Year: 0     Homeless in the Last Year: No       Family History:   Family History   Problem Relation Age of Onset    Lung Cancer Mother     Diabetes Mother     Diabetes Sister     Cancer Brother     High Blood Pressure Brother     Heart Attack Brother        Immunization:  Immunization History   Administered Date(s) Administered    COVID-19, COMIRNATY Proofreader), (age 12y+), IM, 14mcg/0.3mL 06/24/2023    COVID-19, Inactive, MODERNA BLUE border, Primary or Immunocompromised, (age 12y+) 02/19/2019, 04/03/2019, 12/18/2019, 05/22/2020  COVID-19, mNEXSPIKE (Moderna), (age 12y+), IM, 71mcg/0.2mL 12/02/2023    Influenza A (H1N1-09) Vaccine PF IM 01/26/2008    Influenza Virus Vaccine 11/09/2010, 11/09/2011, 11/22/2012, 10/09/2013, 10/27/2014, 02/09/2015, 10/12/2015, 10/16/2015, 10/18/2015, 02/08/2017, 10/24/2018, 10/07/2020    Influenza, FLUAD, (age 29 y+), IM, Quadv, 0.29mL 10/25/2018, 11/05/2019, 10/06/2021    Influenza, FLUAD, (age 28 y+), IM, Trivalent PF, 0.3mL 10/31/2017, 11/13/2018, 10/08/2020, 10/26/2022    Influenza, FLUZONE High Dose, (age 74 y+), IM, Trivalent PF, 0.48mL 10/17/2015, 10/20/2016, 11/07/2016    Pneumococcal Vaccine 11/08/1997, 11/09/2010    Pneumococcal, PCV-13, PREVNAR 13, (age 6w+), IM, 0.80mL 11/09/2010, 07/17/2015    Pneumococcal, PPSV23, PNEUMOVAX 23, (age 2y+), SC/IM, 0.16mL 10/28/2013, 12/04/2014,  01/12/2017    RSV, AREXVY, (age 60y+), PF, IM, 0.5mL 01/21/2022    TDaP, ADACEL (age 95y-64y), BOOSTRIX (age 10y+), IM, 0.20mL 01/09/2008, 02/09/2012, 12/14/2012, 04/13/2018    Td vaccine (adult) 02/08/1993, 12/21/2006, 12/14/2012    Tetanus 12/21/2006, 04/10/2018    Zoster Live (Zostavax) 12/09/2005    Zoster Recombinant (Shingrix) 07/06/2017, 09/08/2017         REVIEW OF SYSTEMS:    CONSTITUTIONAL:  negative for fevers, chills, diaphoresis, activity change, appetite change, fatigue, night sweats and unexpected weight change.   EYES:  negative for blurred vision, eye discharge, visual disturbance and icterus  HEENT:  negative for hearing loss, tinnitus, ear drainage, sinus pressure, nasal congestion, epistaxis and snoring  RESPIRATORY:  See HPI  CARDIOVASCULAR:  negative for chest pain, palpitations, exertional chest pressure/discomfort, edema, syncope  GASTROINTESTINAL:  negative for nausea, vomiting, diarrhea, constipation, blood in stool and abdominal pain  GENITOURINARY:  negative for frequency, dysuria and hematuria  HEMATOLOGIC/LYMPHATIC:  negative for easy bruising, bleeding and lymphadenopathy  ALLERGIC/IMMUNOLOGIC:  negative for recurrent infections, angioedema, anaphylaxis and drug reactions  ENDOCRINE:  negative for weight changes and diabetic symptoms including polyuria, polydipsia and polyphagia    MUSCULOSKELETAL:  negative for  pain, joint swelling, decreased range of motion and muscle weakness  NEUROLOGICAL:  negative for headaches, slurred speech, unilateral weakness  PSYCHIATRIC/BEHAVIORAL: negative for hallucinations, behavioral problems, confusion and agitation.     Objective:   PHYSICAL EXAM:      VITALS:    Vitals:    01/02/24 0934   Pulse: 78   Resp: 16   SpO2: 97%   Weight: 74.8 kg (165 lb)   Height: 1.702 m (5' 7)         CONSTITUTIONAL:  awake, alert, cooperative, no apparent distress, and appears stated age  NECK:  Supple, symmetrical, trachea midline, no adenopathy, thyroid symmetric,  not enlarged and no tenderness  CHEST: Chest expansion equal and symmetrical, no intercostal retraction.  LUNGS:  no increased work of breathing, has expiratory wheezes both lungs, no crackles.  CARDIOVASCULAR: S1 and S2, no edema and no JVD  ABDOMEN:  normal bowel sounds, non-distended and no masses palpated, and no tenderness to palpation. No hepatospleenomegaly  LYMPHADENOPATHY:  no axillary or supraclavicular adenopathy. No cervical adnenopathy  PSYCHIATRIC: Oriented to person place and time. No obvious depression or anxiety.  MUSCULOSKELETAL: No obvious misalignment or effusion of the joints. No clubbing, cyanosis of the digits.  RIGHT AND LEFT LOWER EXTREMITIES: No edema, no inflammation, no tenderness.  SKIN:  normal skin color, texture, turgor and no redness, warmth, or swelling. No palpable nodules    DATA:                        Assessment:     Mild asthma  Plan:     D/w pt  Cpm  No refill needed  Rtc 6 months      "

## 2024-01-02 NOTE — Addendum Note (Signed)
"  Addended by: ULYSESS PENCE on: 01/02/2024 09:40 AM     Modules accepted: Orders    "

## 2024-01-04 LAB — MISCELLANEOUS SENDOUT 4: Test Name: 2012572

## 2024-01-10 ENCOUNTER — Encounter: Payer: MEDICARE | Attending: Internal Medicine | Primary: Family

## 2024-01-16 ENCOUNTER — Encounter: Payer: MEDICARE | Attending: Family | Primary: Family

## 2024-01-16 NOTE — Assessment & Plan Note (Deleted)
" {  A/P Summary:[id number]}         "

## 2024-01-16 NOTE — Progress Notes (Deleted)
 "    Dalton Warner (DOB:  [birth year]) is a 89 y.o. male,Established patient, here for evaluation of the following chief complaint(s):  No chief complaint on file.      Assessment & Plan  Essential hypertension   {A/P Summary:[phone]}         Peripheral vascular disorder due to diabetes mellitus (HCC)   {A/P Summary:[phone]}         Diet-controlled type 2 diabetes mellitus (HCC)   {A/P Summary:407 687 8399}         Paroxysmal atrial fibrillation (HCC)   {A/P Summary:407 687 8399}         Primary osteoarthritis involving multiple joints   {A/P Summary:[phone]}           No follow-ups on file.    Subjective   {?Quick Links Last Outpt Enc Snapshot:[phone]}    HPI  Review of Systems    {?Quick Review   Review Full History       Meds -    albuterol  sulfate HFA (PROVENTIL ;VENTOLIN ;PROAIR ) 108 (90 Base) MCG/ACT inhaler    cyanocobalamin  1000 MCG tablet    metoprolol  succinate (TOPROL  XL) 50 MG extended release tablet    ciprofloxacin-dexAMETHasone  (CIPRODEX) 0.3-0.1 % otic suspension    olopatadine (PATANOL) 0.1 % ophthalmic solution    Humidifiers (COOL MIST HUMIDIFIER) MISC    triamcinolone (KENALOG) 0.1 % cream    fluticasone (FLONASE) 50 MCG/ACT nasal spray    acetaminophen  (TYLENOL ) 500 MG tablet    clopidogrel  (PLAVIX ) 75 MG tablet    aspirin  81 MG EC tablet    fluorouracil (EFUDEX) 5 % cream    atorvastatin  (LIPITOR ) 40 MG tablet    SYSTANE 0.4-0.3 % ophthalmic solution    ketoconazole (NIZORAL) 2 % shampoo    ketoconazole (NIZORAL) 2 % cream    ammonium lactate (AMLACTIN) 12 % cream    cetirizine  (ZYRTEC ) 10 MG tablet    pantoprazole  (PROTONIX ) 40 MG tablet    guaiFENesin  (MUCINEX ) 600 MG extended release tablet    alfuzosin (UROXATRAL) 10 MG extended release tablet    albuterol  sulfate (PROAIR  RESPICLICK) 108 (90 Base) MCG/ACT aerosol powder inhalation    montelukast  (SINGULAIR ) 10 MG tablet   ---  PMH -  Allergies  Arthritis  Atrial fibrillation (HCC)  Bleeding ulcer  BPH (benign prostatic  hyperplasia)  Cancer (HCC)  Colon cancer (HCC)  Deficient knowledge of transesophageal echocardiography (TEE)  Diabetes mellitus (HCC)  DVT (deep venous thrombosis) (HCC)  Fatty liver  Fracture  Hemopericardium  History of ulcer disease  HOH (hard of hearing)  Hx of blood clots  Hx of Doppler ultrasound  Hx of fall  Hyperlipidemia  Hypertension  Inguinal hernia  Limited range of motion (ROM) of shoulder  Myelodysplastic syndrome (HCC)  Neuropathy  Obstructive sleep apnea  Pacemaker  Skin cancer  Sleep apnea  Thrombocytopenia with anemia and myelofibrosis (HCC)  TIA (transient ischemic attack)  Unsteady gait  Wears glasses:[phone]}      {Screening Results (Optional):[id number]}  Objective   {?Quick Links  Synopsis  Labs  Imaging  Results Review  Trend Vitals   ?? Avoid pulling in long tables of results. Comment on relevant results to support your medical decision making.:[phone]}  There were no vitals taken for this visit.     Physical Exam  {?Quick Links Full Problem List    Allergy  Back Pain  Cardiology  Chronic Pain  Diabetes  Hypertension :[id number]}         {Time Documentation Optional:[id number]}  An electronic signature was used to authenticate this note.    --Delon Schooling, APRN - CNP   "

## 2024-01-24 ENCOUNTER — Ambulatory Visit: Admit: 2024-01-24 | Discharge: 2024-01-24 | Payer: MEDICARE | Attending: Internal Medicine | Primary: Family

## 2024-01-24 VITALS — HR 68 | Ht 67.0 in | Wt 165.0 lb

## 2024-01-24 DIAGNOSIS — I4821 Permanent atrial fibrillation: Principal | ICD-10-CM

## 2024-01-24 MED ORDER — CLOPIDOGREL BISULFATE 75 MG PO TABS
75 | ORAL_TABLET | Freq: Every day | ORAL | 1 refills | Status: AC
Start: 2024-01-24 — End: ?

## 2024-01-24 NOTE — Progress Notes (Signed)
 "  Clarine Wallie Mao, MD                                  CARDIOLOGY  NOTE         Chief Complaint:    Chief Complaint   Patient presents with    3 Month Follow-Up     PT denies any cardiac sx states he is having some CA on his back       Follow-up atrial fibrillation      Echo: 09/19/2019    Left ventricular function is normal, EF is estimated at 55-60%.   Mild left ventricular hypertrophy.   Diastolic dysfunction could not be evaluated due to arrhythmia.   Abnormal (paradoxical) motion consistent with RV pacemaker.   No regional wall motion abnormalities were detected.   Bi atrial enlargement noted.   Sclerotic, but non-stenotic aortic valve.   Mitral annular calcification is present.   Moderate mitral, aortic, tricuspid and mild pulmonic regurgitation is   present.   Mild pulmonary hypertension with an RVSP of .   Dilation of the aortic root(4.0) and the ascending aorta(4.2).   No evidence of pericardial effusion.      Prior HPI:     Kannan is a 89 y.o. year old male who was seen in the hospital for chronic atrial fibrillation.  Patient has history of chronic thrombocytopenia.  He has recently moved into town to take care of his wife.  Patient also has a history of LV thrombus in the past.    Chronic atrial fibrillation  CHA2DS2-VASc score is 6     EKG: Atrial fibrillation at 66 bpm      Current Outpatient Medications   Medication Sig Dispense Refill    clopidogrel  (PLAVIX ) 75 MG tablet Take 1 tablet by mouth daily 90 tablet 1    cyanocobalamin  1000 MCG tablet Take 1 tablet by mouth daily 90 tablet 2    metoprolol  succinate (TOPROL  XL) 50 MG extended release tablet Take 1 tablet by mouth nightly 90 tablet 1    ciprofloxacin-dexAMETHasone  (CIPRODEX) 0.3-0.1 % otic suspension INSTILL 1 DROP INTO THE RIGHT EAR IN THE MORNING AND AT BEDTIME      olopatadine (PATANOL) 0.1 % ophthalmic solution Apply to eye      Humidifiers (COOL MIST HUMIDIFIER) MISC 1 each by Does not apply route daily as needed use daily at night       triamcinolone (KENALOG) 0.1 % cream Apply topically as needed (for rash) Apply topically 2 times daily.      fluticasone (FLONASE) 50 MCG/ACT nasal spray 1 spray by Each Nostril route daily as needed for Rhinitis      acetaminophen  (TYLENOL ) 500 MG tablet Take 1 tablet by mouth every 6 hours as needed for Pain      aspirin  81 MG EC tablet Take 1 tablet by mouth daily 90 tablet 1    fluorouracil (EFUDEX) 5 % cream       atorvastatin  (LIPITOR ) 40 MG tablet Take 1 tablet by mouth daily 30 tablet 5    SYSTANE 0.4-0.3 % ophthalmic solution       ketoconazole (NIZORAL) 2 % shampoo       ketoconazole (NIZORAL) 2 % cream       ammonium lactate (AMLACTIN) 12 % cream       cetirizine  (ZYRTEC ) 10 MG tablet Take 1 tablet by mouth daily      pantoprazole  (  PROTONIX ) 40 MG tablet Take 1 tablet by mouth 2 times daily (before meals) 60 tablet 2    guaiFENesin  (MUCINEX ) 600 MG extended release tablet Take 2 tablets by mouth 2 times daily (Patient taking differently: Take 2 tablets by mouth 2 times daily as needed)      alfuzosin (UROXATRAL) 10 MG extended release tablet Take 1 tablet by mouth daily      albuterol  sulfate (PROAIR  RESPICLICK) 108 (90 Base) MCG/ACT aerosol powder inhalation 2 puffs every 6 hours as needed      montelukast  (SINGULAIR ) 10 MG tablet 1 tablet nightly      albuterol  sulfate HFA (PROVENTIL ;VENTOLIN ;PROAIR ) 108 (90 Base) MCG/ACT inhaler Inhale 2 puffs into the lungs every 6 hours as needed for Wheezing 3 each 3     No current facility-administered medications for this visit.       Allergies:     Cat dander, Dust mite extract, Other, and Fish allergy    Patient History:    Past Medical History:   Diagnosis Date    Allergies     Arthritis     Atrial fibrillation (HCC)     Bleeding ulcer 08/2018    BPH (benign prostatic hyperplasia)     Cancer [facility])     Prostate cancer dx March 2010- tx with radiation     Colon cancer [facility]) 2000    per old chart dx with colon ca - tx with surgery only    Deficient knowledge  of transesophageal echocardiography (TEE) 08/15/2023    Diabetes mellitus (HCC)     was borderline- at one time was on Metformin for several yrs-  when I went to [facility] they had me stop this in 2017    DVT (deep venous thrombosis) (HCC) 2015    States DVT 2014 and 2015    Fatty liver     mild    Fracture     had broken neck in Nov 2016- wore a collar for 6 weeks only    Hemopericardium     History of ulcer disease     HOH (hard of hearing)     bil hearing aide    Hx of blood clots     had blood clot - it was a  blood clot in  my esophagus     Hx of Doppler ultrasound 09/19/2019    EF IS 55-60%  Bi atrial enlargement noted.  Moderate mitral, aortic, tricuspid and mild pulmonic regurgitation is present    Hx of fall     last fall 2016- use cane walker and have electric chair    Hyperlipidemia     Hypertension     follow with Dr Orvel at [facility]    Inguinal hernia     Limited range of motion (ROM) of shoulder     right shoulder haved 10 % usage and left shoulder 60 % of usage    Myelodysplastic syndrome (HCC)     Neuropathy     both feet    Obstructive sleep apnea     Pacemaker     Skin cancer     Sleep apnea     had sleep study- tried to give me cpap but could not tolerate it     Thrombocytopenia with anemia and myelofibrosis (HCC)     TIA (transient ischemic attack)     'in Jan 2009- trouble with speech, numbness of lip, weakness right arm - all only lasted for 20 seconds    Unsteady  gait     hx of back problem- was on steroids for 4 yrs- since then stability issues with my back -     Wears glasses     to read      Past Surgical History:   Procedure Laterality Date    ABDOMEN SURGERY  2000    per old chart had right hemicolectomy removed 2 and a half feet of colon and my appendix    COLONOSCOPY  2019    CT BIOPSY BONE MARROW  11/02/2016    CT BONE MARROW BIOPSY 11/02/2016    ENDOSCOPY, COLON, DIAGNOSTIC      per old chart had EGD done 08/2018 and 09/2018    EP DEVICE PROCEDURE N/A  09/22/2023    Watchman laa closure device performed by Cosette Selestine Rank, MD at [facility] CARDIAC CATH LAB    HERNIA REPAIR      per old chart open left ing hernia 1980 and then recurrent left ing hernia repair 08/2018    JOINT REPLACEMENT      per old chart total left knee 2018    OTHER SURGICAL HISTORY  2025    Watchman Procedure    PACEMAKER PLACEMENT      per old chart hx of st jude pacer insertion 2008- new pacer inserted 2019    SHOULDER SURGERY Left 2015    rota. cuff    SHOULDER SURGERY Right 02/27/2019    RIGHT REVERSE SHOULDER TOTAL ARTHROPLASTY REVERSE performed by Chauncey Gladis Hummer, MD at [facility] OR     Family History   Problem Relation Age of Onset    Lung Cancer Mother     Diabetes Mother     Diabetes Sister     Cancer Brother     High Blood Pressure Brother     Heart Attack Brother      Social History     Tobacco Use    Smoking status: Never    Smokeless tobacco: Never   Substance Use Topics    Alcohol use: Not Currently     Comment: maybe 6 times per year when had prostate cancer did drink one glass red wine daily in the past  1cup coffee              Objective:      Physical Exam:    Pulse 68   Ht 1.702 m (5' 7)   Wt 74.8 kg (165 lb)   BMI 25.84 kg/m   Wt Readings from Last 3 Encounters:   01/24/24 74.8 kg (165 lb)   01/02/24 74.8 kg (165 lb)   12/28/23 74.8 kg (165 lb)     Body mass index is 25.84 kg/m.  Vitals:    01/24/24 1044   Pulse: 68        General Appearance and Constitutional: Conversant, Well developed, Well nourished, No acute distress, Non-toxic appearance.   HEENT:  Normocephalic, Atraumatic, Bilateral external ears normal, Oropharynx moist, No oral exudates,   Nose normal.   Neck- Normal range of motion, No tenderness, Supple  Eyes:  EOMI, Conjunctiva normal, No discharge.   Respiratory:  Normal breath sounds, No respiratory distress, No wheezing, No Rales, No Ronchi.  No chest tenderness.   Cardiovascular: S1-S2 IRIR, no added heart sounds, No Mumurs appreciated. No gallops, rubs.  No Pedal Edema   GI:  Bowel sounds normal, Soft, No tenderness,  GU:  No costovertebral angle tenderness   Musculoskeletal:  No gross deformities. Back- No tenderness  Integument:  Well hydrated, no rash   Lymphatic:  No lymphadenopathy noted   Neurologic:  Alert & oriented x 3, Normal motor function, normal sensory function, no focal deficits noted   Psychiatric:  Speech and behavior appropriate       Medical decision making and Data review:    DATA:    Lab Results   Component Value Date    TROPONINT <0.010 03/26/2021     BNP:    Lab Results   Component Value Date    PROBNP 2,032 (H) 01/13/2020     PT/INR:  No results found for: PTINR  Lab Results   Component Value Date    LABA1C 5.8 02/26/2021    LABA1C 6.1 09/29/2020     Lab Results   Component Value Date    CHOL 80 02/26/2021    TRIG 125 02/26/2021    HDL 36 (L) 02/26/2021    LDLDIRECT 37 02/26/2021     Lab Results   Component Value Date    WBC 8.2 12/28/2023    HGB 10.0 (L) 12/28/2023    HCT 30.3 (L) 12/28/2023    MCV 83.5 12/28/2023    PLT 95 (L) 12/28/2023     TSH: No results found for: TSH  Lab Results   Component Value Date    AST 26 12/28/2023    ALT 18 12/28/2023    BILITOT 0.7 12/28/2023    ALKPHOS 78 12/28/2023         All labs, medications and tests reviewed by myself including data and history from outside source , patient and available family .      1. Permanent atrial fibrillation (HCC)    2. Dyslipidemia    3. Essential hypertension    4. Pacemaker    5. Sick sinus syndrome (HCC)                     Impression and Plan:    Chronic atrial fibrillation, with history of TIA as well as hx of LV thrombus.  CHA2DS2-VASc score is > 6. on low-dose Eliquis  understanding high risk of ischemic stroke versus bleeding risk. Cont BB  - No LV thrombus noted on last Echo     Anemia as well as thrombocytopenia ( Follows with Dr. Bethany) last hemoglobin 10.1.  Stable.  On Iron supplements.  Can be observed on low-dose OAC  Fall in October 2024  Status post  Watchman procedure    Due to repeated falls, patient underwent Watchman procedure on 09/22/2023  Follow-up TEE November 14, 2023 with good device placement and no residual leak  We will discontinue Plavix  next visit in February or March and continue with long-term aspirin     History of GI bleeding: Follows up with Dr. Denis -no acute bleeding lately.    Small hematoma right groin: Stable.  Will take some time to resolve, no further workup    Hyperlipidemia: Cont lipitor  40 mg daily      HTN: Cont Metoprolol  50 mg po daily, BP well controlled     Hyperlipidemia: Continue with Lipitor  40 mg daily    SSS s.p PPM . VVIR - Follow up with pacer clinic as outpt       Return in about 6 months (around 07/24/2024).          Counseled extensively and medication compliance urged.  We discussed that for the  prevention of ASCVD our  goal is aggressive risk modification.Patient is encouraged to exercise even a brisk walk for  30 minutes  at least 3 to 4 times a week   Various goals were discussed and questions answered. Continue current medications. Appropriate prescriptions are addressed and refills ordered.  Questions answered and patient verbalizes understanding.  Call for any problems, questions, or concerns. I serve as the patients ongoing cardiologist, providing longitudinal management of multiple chronic and complex cardiovascular conditions including  atrial fibrillation and hypertension, consistent with add-on code G2211                "

## 2024-01-24 NOTE — Patient Instructions (Signed)
"      Thank you for allowing us  to care for you today!   We want to ensure we can follow your treatment plan and we strive to give you the best outcomes and experience possible.   If you ever have a life threatening emergency and call 911 - for an ambulance (EMS)  REMEMBER  Our providers can only care for you at:   [facility] or [facility]   Even if you have someone take you or you drive yourself we can only care for you in a [facility] facility. Our providers are not setup at the other healthcare locations!    PLEASE CALL OUR OFFICE DURING NORMAL BUSINESS HOURS  Monday through Friday 8 am to 5 pm  AFTER HOURS the physician on-call cannot help with scheduling, rescheduling, procedure instruction questions or any type of medication need or issue.  [facility] P:[phone] - [facility] P:[phone] [facility] [phone]      If you receive a survey:  We would appreciate you taking the time to share your experience concerning your provider visit in the office.    These surveys are confidential!  We are eager to improve and are counting on you to share your feedback so we can ensure you get the best care possible.    "

## 2024-03-12 MED ORDER — MONTELUKAST SODIUM 10 MG PO TABS
10 | ORAL_TABLET | Freq: Every evening | ORAL | 3 refills | 90.00000 days | Status: AC
Start: 2024-03-12 — End: ?

## 2024-04-03 ENCOUNTER — Encounter: Payer: MEDICARE | Attending: Nurse Practitioner | Primary: Family
